# Patient Record
Sex: Male | Born: 1955 | Race: Black or African American | Hispanic: No | Smoking: Current every day smoker
Health system: Southern US, Community
[De-identification: ages and names within clinical notes are randomized; demographics above are authoritative.]

## PROBLEM LIST (undated history)

## (undated) DIAGNOSIS — Z91199 Patient's noncompliance with other medical treatment and regimen due to unspecified reason: Secondary | ICD-10-CM

## (undated) DIAGNOSIS — R52 Pain, unspecified: Secondary | ICD-10-CM

## (undated) DIAGNOSIS — Z9119 Patient's noncompliance with other medical treatment and regimen: Secondary | ICD-10-CM

## (undated) DIAGNOSIS — I10 Essential (primary) hypertension: Secondary | ICD-10-CM

## (undated) DIAGNOSIS — G8929 Other chronic pain: Secondary | ICD-10-CM

## (undated) DIAGNOSIS — E119 Type 2 diabetes mellitus without complications: Secondary | ICD-10-CM

## (undated) DIAGNOSIS — N289 Disorder of kidney and ureter, unspecified: Secondary | ICD-10-CM

## (undated) DIAGNOSIS — F1910 Other psychoactive substance abuse, uncomplicated: Secondary | ICD-10-CM

## (undated) HISTORY — PX: FEMUR FRACTURE SURGERY: SHX633

## (undated) HISTORY — PX: REPLACEMENT TOTAL KNEE: SUR1224

---

## 1989-06-15 HISTORY — PX: KNEE SURGERY: SHX244

## 2004-08-14 ENCOUNTER — Ambulatory Visit: Payer: Self-pay | Admitting: Cardiovascular Disease

## 2004-10-09 ENCOUNTER — Emergency Department: Payer: Self-pay | Admitting: Internal Medicine

## 2007-12-31 ENCOUNTER — Emergency Department: Payer: Self-pay | Admitting: Unknown Physician Specialty

## 2008-03-01 ENCOUNTER — Emergency Department: Payer: Self-pay | Admitting: Emergency Medicine

## 2009-11-12 ENCOUNTER — Ambulatory Visit: Payer: Self-pay | Admitting: Pain Medicine

## 2009-11-18 ENCOUNTER — Ambulatory Visit: Payer: Self-pay | Admitting: Pain Medicine

## 2009-11-20 ENCOUNTER — Ambulatory Visit: Payer: Self-pay | Admitting: Pain Medicine

## 2013-03-05 ENCOUNTER — Inpatient Hospital Stay: Payer: Self-pay | Admitting: Student

## 2013-03-05 LAB — CBC
HCT: 50.8 % (ref 40.0–52.0)
HGB: 16.9 g/dL (ref 13.0–18.0)
MCH: 27.9 pg (ref 26.0–34.0)
MCHC: 33.2 g/dL (ref 32.0–36.0)
MCV: 84 fL (ref 80–100)
RBC: 6.04 10*6/uL — ABNORMAL HIGH (ref 4.40–5.90)

## 2013-03-05 LAB — DRUG SCREEN, URINE
Amphetamines, Ur Screen: NEGATIVE (ref ?–1000)
Barbiturates, Ur Screen: NEGATIVE (ref ?–200)
Benzodiazepine, Ur Scrn: POSITIVE (ref ?–200)
Cannabinoid 50 Ng, Ur ~~LOC~~: POSITIVE (ref ?–50)
MDMA (Ecstasy)Ur Screen: NEGATIVE (ref ?–500)
Phencyclidine (PCP) Ur S: NEGATIVE (ref ?–25)
Tricyclic, Ur Screen: NEGATIVE (ref ?–1000)

## 2013-03-05 LAB — COMPREHENSIVE METABOLIC PANEL
Albumin: 4.9 g/dL (ref 3.4–5.0)
Alkaline Phosphatase: 128 U/L (ref 50–136)
BUN: 17 mg/dL (ref 7–18)
Calcium, Total: 9 mg/dL (ref 8.5–10.1)
Chloride: 96 mmol/L — ABNORMAL LOW (ref 98–107)
Co2: 29 mmol/L (ref 21–32)
EGFR (African American): 44 — ABNORMAL LOW
SGOT(AST): 598 U/L — ABNORMAL HIGH (ref 15–37)
SGPT (ALT): 167 U/L — ABNORMAL HIGH (ref 12–78)
Sodium: 132 mmol/L — ABNORMAL LOW (ref 136–145)

## 2013-03-05 LAB — CK TOTAL AND CKMB (NOT AT ARMC)
CK, Total: 14990 U/L — ABNORMAL HIGH (ref 35–232)
CK, Total: 40165 U/L — ABNORMAL HIGH (ref 35–232)
CK-MB: 221 ng/mL — ABNORMAL HIGH (ref 0.5–3.6)

## 2013-03-05 LAB — URINALYSIS, COMPLETE
Bilirubin,UR: NEGATIVE
Glucose,UR: 50 mg/dL (ref 0–75)
Ph: 5 (ref 4.5–8.0)
Protein: 100
RBC,UR: <1 /HPF (ref 0–5)
Specific Gravity: 1.025 (ref 1.003–1.030)
Squamous Epithelial: NONE SEEN
WBC UR: <1 /HPF (ref 0–5)

## 2013-03-05 LAB — ETHANOL: Ethanol %: <0.003 % (ref 0.000–0.080)

## 2013-03-05 LAB — ACETAMINOPHEN LEVEL: Acetaminophen: <2 ug/mL

## 2013-03-05 LAB — TSH: Thyroid Stimulating Horm: 1.07 u[IU]/mL

## 2013-03-05 LAB — SALICYLATE LEVEL: Salicylates, Serum: 3.3 mg/dL — ABNORMAL HIGH

## 2013-03-05 LAB — TROPONIN I: Troponin-I: <0.02 ng/mL

## 2013-03-06 LAB — CK TOTAL AND CKMB (NOT AT ARMC): CK-MB: 482.1 ng/mL — ABNORMAL HIGH (ref 0.5–3.6)

## 2013-03-06 LAB — URIC ACID: Uric Acid: 8.1 mg/dL — ABNORMAL HIGH (ref 3.5–7.2)

## 2013-03-06 LAB — HEPATIC FUNCTION PANEL A (ARMC)
Albumin: 2.5 g/dL — ABNORMAL LOW (ref 3.4–5.0)
Alkaline Phosphatase: 95 U/L (ref 50–136)
Bilirubin, Direct: 0.3 mg/dL — ABNORMAL HIGH (ref 0.00–0.20)
Bilirubin,Total: 0.7 mg/dL (ref 0.2–1.0)
SGOT(AST): 1976 U/L — ABNORMAL HIGH (ref 15–37)
SGPT (ALT): 362 U/L — ABNORMAL HIGH (ref 12–78)
Total Protein: 6.3 g/dL — ABNORMAL LOW (ref 6.4–8.2)

## 2013-03-06 LAB — CBC WITH DIFFERENTIAL/PLATELET
Basophil #: 0.1 10*3/uL (ref 0.0–0.1)
Basophil %: 0.9 %
Eosinophil #: 0 10*3/uL (ref 0.0–0.7)
HCT: 49.7 % (ref 40.0–52.0)
HGB: 16.4 g/dL (ref 13.0–18.0)
Lymphocyte #: 1.2 10*3/uL (ref 1.0–3.6)
Lymphocyte %: 10.8 %
MCH: 27.7 pg (ref 26.0–34.0)
MCHC: 33 g/dL (ref 32.0–36.0)
Monocyte #: 1.6 x10 3/mm — ABNORMAL HIGH (ref 0.2–1.0)
Neutrophil #: 8.5 10*3/uL — ABNORMAL HIGH (ref 1.4–6.5)
Neutrophil %: 74.5 %
WBC: 11.5 10*3/uL — ABNORMAL HIGH (ref 3.8–10.6)

## 2013-03-06 LAB — LIPID PANEL
HDL Cholesterol: 49 mg/dL (ref 40–60)
Ldl Cholesterol, Calc: 126 mg/dL — ABNORMAL HIGH (ref 0–100)

## 2013-03-06 LAB — BASIC METABOLIC PANEL
Anion Gap: 8 (ref 7–16)
Co2: 24 mmol/L (ref 21–32)
Creatinine: 3.77 mg/dL — ABNORMAL HIGH (ref 0.60–1.30)
EGFR (African American): 20 — ABNORMAL LOW
EGFR (Non-African Amer.): 17 — ABNORMAL LOW
Potassium: 5.1 mmol/L (ref 3.5–5.1)

## 2013-03-06 LAB — RAPID HIV-1/2 QL/CONFIRM: HIV-1/2,Rapid Ql: NEGATIVE

## 2013-03-06 LAB — PHOSPHORUS: Phosphorus: 6.8 mg/dL — ABNORMAL HIGH (ref 2.5–4.9)

## 2013-03-07 LAB — DRUG SCREEN, URINE
Amphetamines, Ur Screen: NEGATIVE (ref ?–1000)
Benzodiazepine, Ur Scrn: POSITIVE (ref ?–200)
Cannabinoid 50 Ng, Ur ~~LOC~~: NEGATIVE (ref ?–50)
Cocaine Metabolite,Ur ~~LOC~~: POSITIVE (ref ?–300)
MDMA (Ecstasy)Ur Screen: NEGATIVE (ref ?–500)
Methadone, Ur Screen: POSITIVE (ref ?–300)
Opiate, Ur Screen: POSITIVE (ref ?–300)
Phencyclidine (PCP) Ur S: NEGATIVE (ref ?–25)
Tricyclic, Ur Screen: NEGATIVE (ref ?–1000)

## 2013-03-07 LAB — PROTIME-INR
INR: 1
Prothrombin Time: 13.8 secs (ref 11.5–14.7)

## 2013-03-07 LAB — COMPREHENSIVE METABOLIC PANEL
Albumin: 2.4 g/dL — ABNORMAL LOW (ref 3.4–5.0)
Alkaline Phosphatase: 79 U/L (ref 50–136)
Bilirubin,Total: 0.5 mg/dL (ref 0.2–1.0)
Calcium, Total: 7.6 mg/dL — ABNORMAL LOW (ref 8.5–10.1)
Co2: 25 mmol/L (ref 21–32)
EGFR (African American): 13 — ABNORMAL LOW
EGFR (Non-African Amer.): 11 — ABNORMAL LOW
Glucose: 162 mg/dL — ABNORMAL HIGH (ref 65–99)
Osmolality: 283 (ref 275–301)
Potassium: 4.4 mmol/L (ref 3.5–5.1)
SGOT(AST): 1638 U/L — ABNORMAL HIGH (ref 15–37)
Sodium: 135 mmol/L — ABNORMAL LOW (ref 136–145)

## 2013-03-07 LAB — CBC WITH DIFFERENTIAL/PLATELET
Basophil #: 0.1 10*3/uL (ref 0.0–0.1)
Eosinophil %: 0 %
HGB: 14.2 g/dL (ref 13.0–18.0)
Lymphocyte %: 9.8 %
MCHC: 33.6 g/dL (ref 32.0–36.0)
MCV: 83 fL (ref 80–100)
Neutrophil %: 75 %
Platelet: 90 10*3/uL — ABNORMAL LOW (ref 150–440)
RBC: 5.09 10*6/uL (ref 4.40–5.90)
RDW: 13.7 % (ref 11.5–14.5)

## 2013-03-07 LAB — KAPPA/LAMBDA FREE LIGHT CHAINS (ARMC)

## 2013-03-07 LAB — CK: CK, Total: 39636 U/L — ABNORMAL HIGH (ref 35–232)

## 2013-03-07 LAB — PHOSPHORUS: Phosphorus: 6.8 mg/dL — ABNORMAL HIGH (ref 2.5–4.9)

## 2013-03-08 LAB — CBC WITH DIFFERENTIAL/PLATELET
Basophil #: 0 10*3/uL (ref 0.0–0.1)
Basophil %: 0.2 %
Eosinophil %: 0.2 %
HGB: 11.4 g/dL — ABNORMAL LOW (ref 13.0–18.0)
Lymphocyte #: 1.3 10*3/uL (ref 1.0–3.6)
Lymphocyte %: 17.7 %
MCH: 28.1 pg (ref 26.0–34.0)
Monocyte #: 1.1 x10 3/mm — ABNORMAL HIGH (ref 0.2–1.0)
Monocyte %: 14.8 %
Neutrophil #: 4.9 10*3/uL (ref 1.4–6.5)
Neutrophil %: 67.1 %
Platelet: 75 10*3/uL — ABNORMAL LOW (ref 150–440)
RBC: 4.05 10*6/uL — ABNORMAL LOW (ref 4.40–5.90)
WBC: 7.2 10*3/uL (ref 3.8–10.6)

## 2013-03-08 LAB — COMPREHENSIVE METABOLIC PANEL
Anion Gap: 7 (ref 7–16)
BUN: 46 mg/dL — ABNORMAL HIGH (ref 7–18)
Bilirubin,Total: 0.6 mg/dL (ref 0.2–1.0)
Calcium, Total: 7.5 mg/dL — ABNORMAL LOW (ref 8.5–10.1)
Chloride: 101 mmol/L (ref 98–107)
EGFR (African American): 11 — ABNORMAL LOW
Glucose: 154 mg/dL — ABNORMAL HIGH (ref 65–99)
Osmolality: 283 (ref 275–301)
SGOT(AST): 1176 U/L — ABNORMAL HIGH (ref 15–37)
Sodium: 134 mmol/L — ABNORMAL LOW (ref 136–145)
Total Protein: 5.5 g/dL — ABNORMAL LOW (ref 6.4–8.2)

## 2013-03-08 LAB — CK TOTAL AND CKMB (NOT AT ARMC)
CK, Total: 31335 U/L — ABNORMAL HIGH (ref 35–232)
CK-MB: 66.5 ng/mL — ABNORMAL HIGH (ref 0.5–3.6)

## 2013-03-08 LAB — PHOSPHORUS: Phosphorus: 6 mg/dL — ABNORMAL HIGH (ref 2.5–4.9)

## 2013-03-08 LAB — CK: CK, Total: 25213 U/L — ABNORMAL HIGH (ref 35–232)

## 2013-03-09 LAB — CBC WITH DIFFERENTIAL/PLATELET
Basophil %: 0.7 %
Eosinophil #: 0 10*3/uL (ref 0.0–0.7)
Eosinophil %: 0.4 %
HGB: 11.1 g/dL — ABNORMAL LOW (ref 13.0–18.0)
Lymphocyte #: 1.1 10*3/uL (ref 1.0–3.6)
Lymphocyte %: 16.2 %
MCH: 28.5 pg (ref 26.0–34.0)
MCHC: 34.8 g/dL (ref 32.0–36.0)
Monocyte #: 1 x10 3/mm (ref 0.2–1.0)
Neutrophil #: 4.4 10*3/uL (ref 1.4–6.5)
Neutrophil %: 67.8 %
Platelet: 78 10*3/uL — ABNORMAL LOW (ref 150–440)
RDW: 13.5 % (ref 11.5–14.5)

## 2013-03-09 LAB — COMPREHENSIVE METABOLIC PANEL
Albumin: 2.2 g/dL — ABNORMAL LOW (ref 3.4–5.0)
Anion Gap: 8 (ref 7–16)
BUN: 49 mg/dL — ABNORMAL HIGH (ref 7–18)
Bilirubin,Total: 0.7 mg/dL (ref 0.2–1.0)
Calcium, Total: 7.7 mg/dL — ABNORMAL LOW (ref 8.5–10.1)
Chloride: 100 mmol/L (ref 98–107)
Co2: 24 mmol/L (ref 21–32)
EGFR (African American): 10 — ABNORMAL LOW
Potassium: 4.2 mmol/L (ref 3.5–5.1)
SGPT (ALT): 298 U/L — ABNORMAL HIGH (ref 12–78)
Total Protein: 5.5 g/dL — ABNORMAL LOW (ref 6.4–8.2)

## 2013-03-09 LAB — CK: CK, Total: 20348 U/L — ABNORMAL HIGH (ref 35–232)

## 2013-03-09 LAB — URIC ACID: Uric Acid: 6.6 mg/dL (ref 3.5–7.2)

## 2013-03-10 LAB — COMPREHENSIVE METABOLIC PANEL
Albumin: 2.3 g/dL — ABNORMAL LOW (ref 3.4–5.0)
Anion Gap: 9 (ref 7–16)
BUN: 45 mg/dL — ABNORMAL HIGH (ref 7–18)
Creatinine: 5.9 mg/dL — ABNORMAL HIGH (ref 0.60–1.30)
EGFR (Non-African Amer.): 10 — ABNORMAL LOW
Glucose: 158 mg/dL — ABNORMAL HIGH (ref 65–99)
Osmolality: 279 (ref 275–301)
Potassium: 4.3 mmol/L (ref 3.5–5.1)
SGOT(AST): 744 U/L — ABNORMAL HIGH (ref 15–37)
Sodium: 132 mmol/L — ABNORMAL LOW (ref 136–145)

## 2013-03-10 LAB — CBC WITH DIFFERENTIAL/PLATELET
Basophil %: 0.5 %
Eosinophil #: 0.1 10*3/uL (ref 0.0–0.7)
Eosinophil %: 1.3 %
HCT: 33.1 % — ABNORMAL LOW (ref 40.0–52.0)
HGB: 11.3 g/dL — ABNORMAL LOW (ref 13.0–18.0)
Lymphocyte #: 0.9 10*3/uL — ABNORMAL LOW (ref 1.0–3.6)
Monocyte #: 1.2 x10 3/mm — ABNORMAL HIGH (ref 0.2–1.0)
Monocyte %: 15.4 %
Neutrophil %: 70.6 %
Platelet: 91 10*3/uL — ABNORMAL LOW (ref 150–440)
RDW: 13 % (ref 11.5–14.5)
WBC: 7.7 10*3/uL (ref 3.8–10.6)

## 2013-03-10 LAB — HEPATIC FUNCTION PANEL A (ARMC)
Albumin: 2.2 g/dL — ABNORMAL LOW (ref 3.4–5.0)
Bilirubin,Total: 0.9 mg/dL (ref 0.2–1.0)
Total Protein: 5.7 g/dL — ABNORMAL LOW (ref 6.4–8.2)

## 2013-03-10 LAB — PROTIME-INR: Prothrombin Time: 12.7 secs (ref 11.5–14.7)

## 2013-03-10 LAB — PHOSPHORUS: Phosphorus: 5.5 mg/dL — ABNORMAL HIGH (ref 2.5–4.9)

## 2013-03-11 LAB — CBC WITH DIFFERENTIAL/PLATELET
Basophil #: 0.1 10*3/uL (ref 0.0–0.1)
Basophil %: 0.7 %
Eosinophil #: 0.2 10*3/uL (ref 0.0–0.7)
Eosinophil %: 2.1 %
HCT: 33 % — ABNORMAL LOW (ref 40.0–52.0)
HGB: 11.3 g/dL — ABNORMAL LOW (ref 13.0–18.0)
Lymphocyte #: 1.2 10*3/uL (ref 1.0–3.6)
Lymphocyte %: 13.5 %
MCH: 28.1 pg (ref 26.0–34.0)
Monocyte #: 1.3 x10 3/mm — ABNORMAL HIGH (ref 0.2–1.0)
Monocyte %: 14.5 %
Neutrophil #: 6.3 10*3/uL (ref 1.4–6.5)
Platelet: 98 10*3/uL — ABNORMAL LOW (ref 150–440)
RDW: 13.3 % (ref 11.5–14.5)
WBC: 9.1 10*3/uL (ref 3.8–10.6)

## 2013-03-11 LAB — COMPREHENSIVE METABOLIC PANEL
Anion Gap: 9 (ref 7–16)
Co2: 26 mmol/L (ref 21–32)
Creatinine: 7.92 mg/dL — ABNORMAL HIGH (ref 0.60–1.30)
EGFR (African American): 8 — ABNORMAL LOW
EGFR (Non-African Amer.): 7 — ABNORMAL LOW
Sodium: 128 mmol/L — ABNORMAL LOW (ref 136–145)

## 2013-03-11 LAB — HEPATIC FUNCTION PANEL A (ARMC)
Alkaline Phosphatase: 52 U/L (ref 50–136)
Bilirubin,Total: 1 mg/dL (ref 0.2–1.0)
SGOT(AST): 494 U/L — ABNORMAL HIGH (ref 15–37)
SGPT (ALT): 234 U/L — ABNORMAL HIGH (ref 12–78)
Total Protein: 5.4 g/dL — ABNORMAL LOW (ref 6.4–8.2)

## 2013-03-11 LAB — PROTIME-INR: Prothrombin Time: 12.5 secs (ref 11.5–14.7)

## 2013-03-11 LAB — PHOSPHORUS: Phosphorus: 7.2 mg/dL — ABNORMAL HIGH (ref 2.5–4.9)

## 2013-03-12 LAB — CBC WITH DIFFERENTIAL/PLATELET
Basophil #: 0 10*3/uL (ref 0.0–0.1)
Basophil %: 0.4 %
Eosinophil %: 2.3 %
HCT: 31.2 % — ABNORMAL LOW (ref 40.0–52.0)
Lymphocyte #: 1.2 10*3/uL (ref 1.0–3.6)
Lymphocyte %: 13.2 %
MCH: 28.2 pg (ref 26.0–34.0)
MCV: 81 fL (ref 80–100)
Monocyte #: 1.7 x10 3/mm — ABNORMAL HIGH (ref 0.2–1.0)
Neutrophil #: 6.2 10*3/uL (ref 1.4–6.5)
Neutrophil %: 66.2 %
Platelet: 108 10*3/uL — ABNORMAL LOW (ref 150–440)
WBC: 9.3 10*3/uL (ref 3.8–10.6)

## 2013-03-12 LAB — BASIC METABOLIC PANEL
BUN: 64 mg/dL — ABNORMAL HIGH (ref 7–18)
Calcium, Total: 8.1 mg/dL — ABNORMAL LOW (ref 8.5–10.1)
Chloride: 96 mmol/L — ABNORMAL LOW (ref 98–107)
Co2: 27 mmol/L (ref 21–32)
Creatinine: 7.35 mg/dL — ABNORMAL HIGH (ref 0.60–1.30)
EGFR (African American): 9 — ABNORMAL LOW
EGFR (Non-African Amer.): 8 — ABNORMAL LOW
Glucose: 142 mg/dL — ABNORMAL HIGH (ref 65–99)
Osmolality: 283 (ref 275–301)

## 2013-03-13 LAB — HEPATIC FUNCTION PANEL A (ARMC)
Albumin: 2.1 g/dL — ABNORMAL LOW (ref 3.4–5.0)
Bilirubin, Direct: 0.2 mg/dL (ref 0.00–0.20)
Bilirubin,Total: 0.7 mg/dL (ref 0.2–1.0)
SGOT(AST): 221 U/L — ABNORMAL HIGH (ref 15–37)
SGPT (ALT): 152 U/L — ABNORMAL HIGH (ref 12–78)
Total Protein: 5 g/dL — ABNORMAL LOW (ref 6.4–8.2)

## 2013-03-13 LAB — BASIC METABOLIC PANEL
BUN: 78 mg/dL — ABNORMAL HIGH (ref 7–18)
Calcium, Total: 8 mg/dL — ABNORMAL LOW (ref 8.5–10.1)
Co2: 24 mmol/L (ref 21–32)
Potassium: 4.4 mmol/L (ref 3.5–5.1)
Sodium: 128 mmol/L — ABNORMAL LOW (ref 136–145)

## 2013-03-13 LAB — CK: CK, Total: 6868 U/L — ABNORMAL HIGH (ref 35–232)

## 2013-03-13 LAB — CBC WITH DIFFERENTIAL/PLATELET
Eosinophil #: 0.2 10*3/uL (ref 0.0–0.7)
Eosinophil %: 2.3 %
HCT: 29.6 % — ABNORMAL LOW (ref 40.0–52.0)
HGB: 10.3 g/dL — ABNORMAL LOW (ref 13.0–18.0)
Lymphocyte #: 1.6 10*3/uL (ref 1.0–3.6)
MCV: 81 fL (ref 80–100)
Monocyte %: 16 %
Platelet: 110 10*3/uL — ABNORMAL LOW (ref 150–440)
RBC: 3.64 10*6/uL — ABNORMAL LOW (ref 4.40–5.90)
WBC: 9.6 10*3/uL (ref 3.8–10.6)

## 2013-03-13 LAB — PHOSPHORUS: Phosphorus: 7 mg/dL — ABNORMAL HIGH (ref 2.5–4.9)

## 2013-03-14 LAB — CBC WITH DIFFERENTIAL/PLATELET
Basophil #: 0 10*3/uL (ref 0.0–0.1)
Basophil %: 0.3 %
Eosinophil %: 1.8 %
Lymphocyte #: 1.2 10*3/uL (ref 1.0–3.6)
Lymphocyte %: 13.3 %
MCH: 28 pg (ref 26.0–34.0)
MCV: 82 fL (ref 80–100)
Monocyte #: 1.6 x10 3/mm — ABNORMAL HIGH (ref 0.2–1.0)
Monocyte %: 17.5 %
Neutrophil #: 6.2 10*3/uL (ref 1.4–6.5)
Platelet: 116 10*3/uL — ABNORMAL LOW (ref 150–440)

## 2013-03-14 LAB — PROTIME-INR
INR: 1
Prothrombin Time: 13.4 secs (ref 11.5–14.7)

## 2013-03-14 LAB — BASIC METABOLIC PANEL
Anion Gap: 7 (ref 7–16)
BUN: 65 mg/dL — ABNORMAL HIGH (ref 7–18)
Chloride: 96 mmol/L — ABNORMAL LOW (ref 98–107)
Co2: 29 mmol/L (ref 21–32)
EGFR (African American): 8 — ABNORMAL LOW
Glucose: 165 mg/dL — ABNORMAL HIGH (ref 65–99)
Potassium: 4.3 mmol/L (ref 3.5–5.1)
Sodium: 132 mmol/L — ABNORMAL LOW (ref 136–145)

## 2013-03-14 LAB — HEPATIC FUNCTION PANEL A (ARMC)
Albumin: 1.9 g/dL — ABNORMAL LOW (ref 3.4–5.0)
SGOT(AST): 156 U/L — ABNORMAL HIGH (ref 15–37)
Total Protein: 4.7 g/dL — ABNORMAL LOW (ref 6.4–8.2)

## 2013-03-15 LAB — BASIC METABOLIC PANEL
Anion Gap: 7 (ref 7–16)
BUN: 47 mg/dL — ABNORMAL HIGH (ref 7–18)
Calcium, Total: 7.9 mg/dL — ABNORMAL LOW (ref 8.5–10.1)
Chloride: 96 mmol/L — ABNORMAL LOW (ref 98–107)
Co2: 29 mmol/L (ref 21–32)
Creatinine: 6.47 mg/dL — ABNORMAL HIGH (ref 0.60–1.30)
EGFR (African American): 10 — ABNORMAL LOW
EGFR (Non-African Amer.): 9 — ABNORMAL LOW
Glucose: 137 mg/dL — ABNORMAL HIGH (ref 65–99)
Osmolality: 279 (ref 275–301)
Potassium: 4.2 mmol/L (ref 3.5–5.1)
Sodium: 132 mmol/L — ABNORMAL LOW (ref 136–145)

## 2013-03-15 LAB — CBC WITH DIFFERENTIAL/PLATELET
Basophil #: 0.1 10*3/uL (ref 0.0–0.1)
Basophil %: 0.8 %
Eosinophil #: 0.1 10*3/uL (ref 0.0–0.7)
Eosinophil %: 1.3 %
HCT: 27 % — ABNORMAL LOW (ref 40.0–52.0)
HGB: 9.2 g/dL — ABNORMAL LOW (ref 13.0–18.0)
Lymphocyte #: 1.5 10*3/uL (ref 1.0–3.6)
Lymphocyte %: 15.4 %
MCH: 28 pg (ref 26.0–34.0)
MCHC: 34 g/dL (ref 32.0–36.0)
MCV: 82 fL (ref 80–100)
Monocyte #: 1.5 x10 3/mm — ABNORMAL HIGH (ref 0.2–1.0)
Monocyte %: 15.4 %
Neutrophil #: 6.5 10*3/uL (ref 1.4–6.5)
Neutrophil %: 67.1 %
Platelet: 121 10*3/uL — ABNORMAL LOW (ref 150–440)
RBC: 3.28 10*6/uL — ABNORMAL LOW (ref 4.40–5.90)
RDW: 13.3 % (ref 11.5–14.5)
WBC: 9.6 10*3/uL (ref 3.8–10.6)

## 2013-03-15 LAB — PROTIME-INR: INR: 1

## 2013-03-15 LAB — HEPATIC FUNCTION PANEL A (ARMC)
SGOT(AST): 124 U/L — ABNORMAL HIGH (ref 15–37)
SGPT (ALT): 106 U/L — ABNORMAL HIGH (ref 12–78)
Total Protein: 4.8 g/dL — ABNORMAL LOW (ref 6.4–8.2)

## 2013-03-16 ENCOUNTER — Inpatient Hospital Stay
Admission: AD | Admit: 2013-03-16 | Discharge: 2013-04-05 | Disposition: A | Discharge status: Home Health | Payer: No Typology Code available for payment source | Source: Ambulatory Visit | Attending: Internal Medicine | Admitting: Internal Medicine

## 2013-03-16 LAB — CBC WITH DIFFERENTIAL/PLATELET
Basophil #: 0 10*3/uL (ref 0.0–0.1)
Basophil %: 0.4 %
HCT: 25.3 % — ABNORMAL LOW (ref 40.0–52.0)
HGB: 8.5 g/dL — ABNORMAL LOW (ref 13.0–18.0)
MCV: 83 fL (ref 80–100)
Monocyte #: 1.3 x10 3/mm — ABNORMAL HIGH (ref 0.2–1.0)
Neutrophil #: 6.2 10*3/uL (ref 1.4–6.5)
RBC: 3.05 10*6/uL — ABNORMAL LOW (ref 4.40–5.90)
RDW: 13.6 % (ref 11.5–14.5)

## 2013-03-16 LAB — COMPREHENSIVE METABOLIC PANEL
Alkaline Phosphatase: 45 U/L — ABNORMAL LOW (ref 50–136)
Anion Gap: 6 — ABNORMAL LOW (ref 7–16)
BUN: 36 mg/dL — ABNORMAL HIGH (ref 7–18)
Bilirubin,Total: 0.7 mg/dL (ref 0.2–1.0)
EGFR (Non-African Amer.): 10 — ABNORMAL LOW
Glucose: 146 mg/dL — ABNORMAL HIGH (ref 65–99)
SGOT(AST): 112 U/L — ABNORMAL HIGH (ref 15–37)
SGPT (ALT): 92 U/L — ABNORMAL HIGH (ref 12–78)
Sodium: 134 mmol/L — ABNORMAL LOW (ref 136–145)

## 2013-03-16 LAB — PHOSPHORUS
Phosphorus: 3.8 mg/dL (ref 2.5–4.9)
Phosphorus: 3.7 mg/dL (ref 2.5–4.9)

## 2013-03-17 ENCOUNTER — Other Ambulatory Visit (HOSPITAL_COMMUNITY): Payer: No Typology Code available for payment source

## 2013-03-17 LAB — PRO B NATRIURETIC PEPTIDE: Pro B Natriuretic peptide (BNP): 5120 pg/mL — ABNORMAL HIGH (ref 0–125)

## 2013-03-17 LAB — PHOSPHORUS: Phosphorus: 3.9 mg/dL (ref 2.3–4.6)

## 2013-03-17 LAB — MAGNESIUM: Magnesium: 1.8 mg/dL (ref 1.5–2.5)

## 2013-03-17 LAB — PREALBUMIN: Prealbumin: 13.7 mg/dL — ABNORMAL LOW (ref 17.0–34.0)

## 2013-03-18 ENCOUNTER — Other Ambulatory Visit (HOSPITAL_COMMUNITY): Payer: Self-pay

## 2013-03-18 ENCOUNTER — Other Ambulatory Visit (HOSPITAL_COMMUNITY): Payer: No Typology Code available for payment source

## 2013-03-18 LAB — HEMOGLOBIN A1C
Hgb A1c MFr Bld: 8.6 % — ABNORMAL HIGH
Mean Plasma Glucose: 200 mg/dL — ABNORMAL HIGH

## 2013-03-18 LAB — CBC WITH DIFFERENTIAL/PLATELET
Basophils Absolute: 0 K/uL (ref 0.0–0.1)
Basophils Relative: 0 % (ref 0–1)
Eosinophils Absolute: 0.1 K/uL (ref 0.0–0.7)
Eosinophils Relative: 1 % (ref 0–5)
HCT: 24.2 % — ABNORMAL LOW (ref 39.0–52.0)
Hemoglobin: 8 g/dL — ABNORMAL LOW (ref 13.0–17.0)
Lymphocytes Relative: 16 % (ref 12–46)
Lymphs Abs: 1.6 K/uL (ref 0.7–4.0)
MCH: 27.6 pg (ref 26.0–34.0)
MCHC: 33.1 g/dL (ref 30.0–36.0)
MCV: 83.4 fL (ref 78.0–100.0)
Monocytes Absolute: 1.4 K/uL — ABNORMAL HIGH (ref 0.1–1.0)
Monocytes Relative: 15 % — ABNORMAL HIGH (ref 3–12)
Neutro Abs: 6.7 K/uL (ref 1.7–7.7)
Neutrophils Relative %: 68 % (ref 43–77)
Platelets: 136 K/uL — ABNORMAL LOW (ref 150–400)
RBC: 2.9 MIL/uL — ABNORMAL LOW (ref 4.22–5.81)
RDW: 13.9 % (ref 11.5–15.5)
WBC: 9.8 K/uL (ref 4.0–10.5)

## 2013-03-18 LAB — URINALYSIS, ROUTINE W REFLEX MICROSCOPIC
Glucose, UA: NEGATIVE mg/dL
Ketones, ur: NEGATIVE mg/dL
Nitrite: NEGATIVE
Protein, ur: >300 mg/dL — AB

## 2013-03-18 LAB — BASIC METABOLIC PANEL WITH GFR
BUN: 32 mg/dL — ABNORMAL HIGH (ref 6–23)
CO2: 30 meq/L (ref 19–32)
Calcium: 7.9 mg/dL — ABNORMAL LOW (ref 8.4–10.5)
Chloride: 95 meq/L — ABNORMAL LOW (ref 96–112)
Creatinine, Ser: 5.37 mg/dL — ABNORMAL HIGH (ref 0.50–1.35)
GFR calc Af Amer: 10 mL/min — ABNORMAL LOW
GFR calc non Af Amer: 8 mL/min — ABNORMAL LOW
Glucose, Bld: 113 mg/dL — ABNORMAL HIGH (ref 70–99)
Potassium: 4.3 meq/L (ref 3.5–5.1)
Sodium: 133 meq/L — ABNORMAL LOW (ref 135–145)

## 2013-03-18 LAB — HEPATIC FUNCTION PANEL
AST: 50 U/L — ABNORMAL HIGH (ref 0–37)
Albumin: 2.2 g/dL — ABNORMAL LOW (ref 3.5–5.2)
Total Bilirubin: 0.7 mg/dL (ref 0.3–1.2)
Total Protein: 5.1 g/dL — ABNORMAL LOW (ref 6.0–8.3)

## 2013-03-18 LAB — ALBUMIN: Albumin: 2.1 g/dL — ABNORMAL LOW (ref 3.5–5.2)

## 2013-03-18 LAB — URINE MICROSCOPIC-ADD ON

## 2013-03-18 LAB — TSH: TSH: 2.823 u[IU]/mL (ref 0.350–4.500)

## 2013-03-19 ENCOUNTER — Other Ambulatory Visit (HOSPITAL_COMMUNITY): Payer: No Typology Code available for payment source

## 2013-03-19 LAB — HEPATIC FUNCTION PANEL
AST: 38 U/L — ABNORMAL HIGH (ref 0–37)
Albumin: 2.2 g/dL — ABNORMAL LOW (ref 3.5–5.2)
Total Bilirubin: 0.8 mg/dL (ref 0.3–1.2)
Total Protein: 5.4 g/dL — ABNORMAL LOW (ref 6.0–8.3)

## 2013-03-19 LAB — CBC WITH DIFFERENTIAL/PLATELET
Basophils Absolute: 0 10*3/uL (ref 0.0–0.1)
Basophils Relative: 0 % (ref 0–1)
Eosinophils Relative: 1 % (ref 0–5)
Hemoglobin: 7.8 g/dL — ABNORMAL LOW (ref 13.0–17.0)
Lymphs Abs: 1.6 10*3/uL (ref 0.7–4.0)
MCHC: 33.9 g/dL (ref 30.0–36.0)
MCV: 82.4 fL (ref 78.0–100.0)
Monocytes Relative: 10 % (ref 3–12)
Neutro Abs: 8.2 10*3/uL — ABNORMAL HIGH (ref 1.7–7.7)
Neutrophils Relative %: 74 % (ref 43–77)
Platelets: 151 10*3/uL (ref 150–400)
RBC: 2.79 MIL/uL — ABNORMAL LOW (ref 4.22–5.81)

## 2013-03-19 LAB — BLOOD GAS, ARTERIAL
Bicarbonate: 27.3 mEq/L — ABNORMAL HIGH (ref 20.0–24.0)
O2 Content: 5 L/min
O2 Saturation: 92.2 %
pCO2 arterial: 41.3 mmHg (ref 35.0–45.0)
pH, Arterial: 7.435 (ref 7.350–7.450)
pO2, Arterial: 62.5 mmHg — ABNORMAL LOW (ref 80.0–100.0)

## 2013-03-19 LAB — URINE CULTURE

## 2013-03-19 LAB — HIV ANTIBODY (ROUTINE TESTING W REFLEX): HIV: NONREACTIVE

## 2013-03-19 LAB — BASIC METABOLIC PANEL
BUN: 56 mg/dL — ABNORMAL HIGH (ref 6–23)
GFR calc Af Amer: 9 mL/min — ABNORMAL LOW (ref >90)
GFR calc non Af Amer: 8 mL/min — ABNORMAL LOW (ref >90)
Glucose, Bld: 98 mg/dL (ref 70–99)
Potassium: 4.9 mEq/L (ref 3.5–5.1)
Sodium: 130 mEq/L — ABNORMAL LOW (ref 135–145)

## 2013-03-19 LAB — PREPARE RBC (CROSSMATCH)

## 2013-03-19 LAB — CK: Total CK: 896 U/L — ABNORMAL HIGH (ref 7–232)

## 2013-03-20 LAB — BASIC METABOLIC PANEL
BUN: 70 mg/dL — ABNORMAL HIGH (ref 6–23)
Chloride: 95 mEq/L — ABNORMAL LOW (ref 96–112)
Creatinine, Ser: 8.35 mg/dL — ABNORMAL HIGH (ref 0.50–1.35)
GFR calc Af Amer: 7 mL/min — ABNORMAL LOW (ref >90)
GFR calc non Af Amer: 6 mL/min — ABNORMAL LOW (ref >90)
Potassium: 5.2 mEq/L — ABNORMAL HIGH (ref 3.5–5.1)

## 2013-03-20 LAB — CBC WITH DIFFERENTIAL/PLATELET
Basophils Absolute: 0 10*3/uL (ref 0.0–0.1)
Basophils Relative: 0 % (ref 0–1)
Eosinophils Relative: 2 % (ref 0–5)
Hemoglobin: 7.4 g/dL — ABNORMAL LOW (ref 13.0–17.0)
Lymphocytes Relative: 15 % (ref 12–46)
Lymphs Abs: 1.4 10*3/uL (ref 0.7–4.0)
MCHC: 33.8 g/dL (ref 30.0–36.0)
Monocytes Absolute: 1 10*3/uL (ref 0.1–1.0)
Neutro Abs: 7.3 10*3/uL (ref 1.7–7.7)
Neutrophils Relative %: 74 % (ref 43–77)
RDW: 13.4 % (ref 11.5–15.5)
WBC: 9.9 10*3/uL (ref 4.0–10.5)

## 2013-03-20 LAB — VANCOMYCIN, RANDOM: Vancomycin Rm: 7.7 ug/mL

## 2013-03-21 LAB — CULTURE, BLOOD (SINGLE)

## 2013-03-21 LAB — BASIC METABOLIC PANEL
Chloride: 95 mEq/L — ABNORMAL LOW (ref 96–112)
GFR calc Af Amer: 9 mL/min — ABNORMAL LOW (ref >90)
GFR calc non Af Amer: 8 mL/min — ABNORMAL LOW (ref >90)
Potassium: 4.5 mEq/L (ref 3.5–5.1)
Sodium: 134 mEq/L — ABNORMAL LOW (ref 135–145)

## 2013-03-21 LAB — CBC
Hemoglobin: 7.2 g/dL — ABNORMAL LOW (ref 13.0–17.0)
MCHC: 32.9 g/dL (ref 30.0–36.0)
Platelets: 161 10*3/uL (ref 150–400)
RBC: 2.6 MIL/uL — ABNORMAL LOW (ref 4.22–5.81)
WBC: 7.9 10*3/uL (ref 4.0–10.5)

## 2013-03-21 LAB — MAGNESIUM: Magnesium: 2.1 mg/dL (ref 1.5–2.5)

## 2013-03-21 LAB — PHOSPHORUS: Phosphorus: 5.8 mg/dL — ABNORMAL HIGH (ref 2.3–4.6)

## 2013-03-21 LAB — HEPATITIS B E ANTIGEN: Hep B E Ag: NEGATIVE

## 2013-03-22 ENCOUNTER — Other Ambulatory Visit (HOSPITAL_COMMUNITY): Payer: No Typology Code available for payment source

## 2013-03-22 LAB — HEMOGLOBIN AND HEMATOCRIT, BLOOD
HCT: 20.6 % — ABNORMAL LOW (ref 39.0–52.0)
Hemoglobin: 7 g/dL — ABNORMAL LOW (ref 13.0–17.0)

## 2013-03-22 NOTE — Progress Notes (Signed)
Successful removal of right IJ Trialysis catheter. Proper Trendelenberg position and technique followed. Sterile prep. Occlusive dressing applied and pressure held X . No complications. Tip sent for culture.  Brayton El PA-C Interventional Radiology 03/22/2013 12:55 PM

## 2013-03-22 NOTE — H&P (Signed)
Joshua Gordon is an 57 y.o. male.   Chief Complaint: pt admitted to [facility] last week from [facility]. Presented with altered mental stats and fall at home ---- to [facility]. Dx: rhabdomyolysis; renal failure and need for dialysis. Transferred to [facility] for rehab and dialysis. Temporary trialysis catheter was placed in Rt IJ before dc from [facility]. Developed fever with +BC: coag neg staph 10/4 Was started on Vanco and Meropenem 10/4 Catheter is also noted to be not functioning properly- RN feels it is running slow. Last dialysis was Mon in [facility] Scheduled now for temporary trialysis catheter removal 10/8 then replacement 10/9 Discussed with dialysis RN on floor- plan per Dr Thedore Mins HPI: Multisubstance abuse; HTN; DM; renal failure  No past medical history on file.  No past surgical history on file.  No family history on file. Social History:  has no tobacco, alcohol, and drug history on file.  Allergies: Allergies not on file  No prescriptions prior to admission    Results for orders placed during the hospital encounter of 03/16/13 (from the past 48 hour(s))  VANCOMYCIN, RANDOM     Status: None   Collection Time    03/20/13 10:30 PM      Result Value Range   Vancomycin Rm 7.7     Comment:            Random Vancomycin therapeutic     range is dependent on dosage and     time of specimen collection.     A peak range is 20.0-40.0 ug/mL     A trough range is 5.0-15.0 ug/mL             BASIC METABOLIC PANEL     Status: Abnormal   Collection Time    03/21/13  6:15 AM      Result Value Range   Sodium 134 (*) 135 - 145 mEq/L   Potassium 4.5  3.5 - 5.1 mEq/L   Chloride 95 (*) 96 - 112 mEq/L   CO2 29  19 - 32 mEq/L   Glucose, Bld 110 (*) 70 - 99 mg/dL   BUN 57 (*) 6 - 23 mg/dL   Creatinine, Ser 5.40 (*) 0.50 - 1.35 mg/dL   Calcium 7.9 (*) 8.4 - 10.5 mg/dL   GFR calc non Af Amer 8 (*) >90 mL/min   GFR calc Af Amer 9 (*) >90 mL/min   Comment: (NOTE)     The  eGFR has been calculated using the CKD EPI equation.     This calculation has not been validated in all clinical situations.     eGFR's persistently <90 mL/min signify possible Chronic Kidney     Disease.  CBC     Status: Abnormal   Collection Time    03/21/13  6:15 AM      Result Value Range   WBC 7.9  4.0 - 10.5 K/uL   RBC 2.60 (*) 4.22 - 5.81 MIL/uL   Hemoglobin 7.2 (*) 13.0 - 17.0 g/dL   HCT 98.1 (*) 19.1 - 47.8 %   MCV 84.2  78.0 - 100.0 fL   MCH 27.7  26.0 - 34.0 pg   MCHC 32.9  30.0 - 36.0 g/dL   RDW 29.5  62.1 - 30.8 %   Platelets 161  150 - 400 K/uL  PHOSPHORUS     Status: Abnormal   Collection Time    03/21/13  6:15 AM      Result Value Range   Phosphorus 5.8 (*)  2.3 - 4.6 mg/dL  MAGNESIUM     Status: None   Collection Time    03/21/13  6:15 AM      Result Value Range   Magnesium 2.1  1.5 - 2.5 mg/dL  HEMOGLOBIN AND HEMATOCRIT, BLOOD     Status: Abnormal   Collection Time    03/22/13 10:35 AM      Result Value Range   Hemoglobin 7.0 (*) 13.0 - 17.0 g/dL   HCT 78.2 (*) 95.6 - 21.3 %   No results found.  Review of Systems  Constitutional: Negative for fever.  Respiratory: Negative for sputum production and shortness of breath.   Cardiovascular: Negative for chest pain.  Gastrointestinal: Negative for nausea, vomiting and abdominal pain.  Neurological: Positive for weakness. Negative for headaches.  Psychiatric/Behavioral: Positive for depression and substance abuse. The patient is nervous/anxious.        Polysubstance abuse    There were no vitals taken for this visit. Physical Exam  Constitutional: He is oriented to person, place, and time. He appears well-nourished.  Cardiovascular: Normal rate and regular rhythm.   No murmur heard. Respiratory: Effort normal and breath sounds normal. He has no wheezes.  GI: Soft. Bowel sounds are normal. There is no tenderness.  Musculoskeletal: Normal range of motion.  Neurological: He is alert and oriented to person,  place, and time. Coordination normal.  Skin: Skin is warm and dry.  Psychiatric: He has a normal mood and affect. His behavior is normal. Judgment and thought content normal.     Assessment/Plan Polysubstance abuse -- rhabdomyolysis -- renal failure- need for dialysis New +BC 10/4; on Vanco and meropenem Scheduled for trialysis catheter removal 10/8 then replacement of same 10/9 Plan per Renal MD Pt and wife aware of procedure benefits and risks and agreeable to proceed Consents signed and in chart  Fergus Throne A 03/22/2013, 11:22 AM

## 2013-03-23 ENCOUNTER — Other Ambulatory Visit (HOSPITAL_COMMUNITY): Payer: No Typology Code available for payment source

## 2013-03-23 LAB — CBC
HCT: 20.4 % — ABNORMAL LOW (ref 39.0–52.0)
Hemoglobin: 6.8 g/dL — CL (ref 13.0–17.0)
MCH: 27.6 pg (ref 26.0–34.0)
RBC: 2.46 MIL/uL — ABNORMAL LOW (ref 4.22–5.81)
WBC: 7.4 10*3/uL (ref 4.0–10.5)

## 2013-03-23 LAB — RENAL FUNCTION PANEL
BUN: 81 mg/dL — ABNORMAL HIGH (ref 6–23)
CO2: 27 mEq/L (ref 19–32)
Calcium: 8.4 mg/dL (ref 8.4–10.5)
GFR calc Af Amer: 7 mL/min — ABNORMAL LOW (ref >90)
Glucose, Bld: 98 mg/dL (ref 70–99)
Potassium: 4.3 mEq/L (ref 3.5–5.1)
Sodium: 135 mEq/L (ref 135–145)

## 2013-03-23 LAB — TYPE AND SCREEN

## 2013-03-23 LAB — PREPARE RBC (CROSSMATCH)

## 2013-03-23 NOTE — Progress Notes (Signed)
Echocardiogram 2D Echocardiogram has been performed.  Joshua Gordon 03/23/2013, 4:13 PM

## 2013-03-24 LAB — CULTURE, BLOOD (ROUTINE X 2)
Culture: NO GROWTH
Culture: NO GROWTH

## 2013-03-24 LAB — TYPE AND SCREEN
ABO/RH(D): A NEG
Unit division: 0

## 2013-03-24 LAB — RENAL FUNCTION PANEL
Albumin: 2.4 g/dL — ABNORMAL LOW (ref 3.5–5.2)
CO2: 31 mEq/L (ref 19–32)
Calcium: 8.2 mg/dL — ABNORMAL LOW (ref 8.4–10.5)
Chloride: 97 mEq/L (ref 96–112)
Creatinine, Ser: 6.02 mg/dL — ABNORMAL HIGH (ref 0.50–1.35)
GFR calc Af Amer: 11 mL/min — ABNORMAL LOW (ref >90)
GFR calc non Af Amer: 10 mL/min — ABNORMAL LOW (ref >90)
Phosphorus: 5.5 mg/dL — ABNORMAL HIGH (ref 2.3–4.6)
Sodium: 137 mEq/L (ref 135–145)

## 2013-03-24 LAB — HEMOGLOBIN AND HEMATOCRIT, BLOOD
HCT: 24 % — ABNORMAL LOW (ref 39.0–52.0)
Hemoglobin: 8.1 g/dL — ABNORMAL LOW (ref 13.0–17.0)

## 2013-03-25 LAB — PROTIME-INR: INR: 1.13 (ref 0.00–1.49)

## 2013-03-26 LAB — CATH TIP CULTURE

## 2013-03-26 LAB — PROTIME-INR: Prothrombin Time: 15.6 seconds — ABNORMAL HIGH (ref 11.6–15.2)

## 2013-03-27 LAB — COMPREHENSIVE METABOLIC PANEL
Alkaline Phosphatase: 50 U/L (ref 39–117)
BUN: 72 mg/dL — ABNORMAL HIGH (ref 6–23)
CO2: 27 mEq/L (ref 19–32)
Chloride: 94 mEq/L — ABNORMAL LOW (ref 96–112)
Creatinine, Ser: 5.81 mg/dL — ABNORMAL HIGH (ref 0.50–1.35)
GFR calc Af Amer: 12 mL/min — ABNORMAL LOW (ref >90)
GFR calc non Af Amer: 10 mL/min — ABNORMAL LOW (ref >90)
Glucose, Bld: 126 mg/dL — ABNORMAL HIGH (ref 70–99)
Potassium: 3.4 mEq/L — ABNORMAL LOW (ref 3.5–5.1)
Sodium: 132 mEq/L — ABNORMAL LOW (ref 135–145)
Total Bilirubin: 0.6 mg/dL (ref 0.3–1.2)
Total Protein: 6.7 g/dL (ref 6.0–8.3)

## 2013-03-27 LAB — CBC
HCT: 22.5 % — ABNORMAL LOW (ref 39.0–52.0)
Hemoglobin: 7.4 g/dL — ABNORMAL LOW (ref 13.0–17.0)
MCV: 85.2 fL (ref 78.0–100.0)
RBC: 2.64 MIL/uL — ABNORMAL LOW (ref 4.22–5.81)
WBC: 5.9 10*3/uL (ref 4.0–10.5)

## 2013-03-27 LAB — PROTIME-INR
INR: 1.15 (ref 0.00–1.49)
Prothrombin Time: 14.5 seconds (ref 11.6–15.2)

## 2013-03-28 LAB — CBC
HCT: 22.1 % — ABNORMAL LOW (ref 39.0–52.0)
Hemoglobin: 7.3 g/dL — ABNORMAL LOW (ref 13.0–17.0)
MCH: 28.1 pg (ref 26.0–34.0)
MCHC: 33 g/dL (ref 30.0–36.0)
Platelets: 178 10*3/uL (ref 150–400)
RBC: 2.6 MIL/uL — ABNORMAL LOW (ref 4.22–5.81)
RDW: 13.9 % (ref 11.5–15.5)
WBC: 5.8 10*3/uL (ref 4.0–10.5)

## 2013-03-28 LAB — RENAL FUNCTION PANEL
Albumin: 2.6 g/dL — ABNORMAL LOW (ref 3.5–5.2)
BUN: 51 mg/dL — ABNORMAL HIGH (ref 6–23)
CO2: 31 mEq/L (ref 19–32)
Chloride: 100 mEq/L (ref 96–112)
Creatinine, Ser: 4.08 mg/dL — ABNORMAL HIGH (ref 0.50–1.35)
GFR calc non Af Amer: 15 mL/min — ABNORMAL LOW (ref >90)
Potassium: 3.8 mEq/L (ref 3.5–5.1)
Sodium: 139 mEq/L (ref 135–145)

## 2013-03-28 LAB — PROTIME-INR
INR: 1.17 (ref 0.00–1.49)
Prothrombin Time: 14.7 seconds (ref 11.6–15.2)

## 2013-03-29 LAB — BASIC METABOLIC PANEL
BUN: 48 mg/dL — ABNORMAL HIGH (ref 6–23)
CO2: 27 mEq/L (ref 19–32)
Calcium: 8.7 mg/dL (ref 8.4–10.5)
Creatinine, Ser: 3.57 mg/dL — ABNORMAL HIGH (ref 0.50–1.35)
GFR calc Af Amer: 21 mL/min — ABNORMAL LOW (ref >90)
GFR calc non Af Amer: 18 mL/min — ABNORMAL LOW (ref >90)
Glucose, Bld: 143 mg/dL — ABNORMAL HIGH (ref 70–99)
Sodium: 138 mEq/L (ref 135–145)

## 2013-03-30 LAB — RENAL FUNCTION PANEL
BUN: 45 mg/dL — ABNORMAL HIGH (ref 6–23)
CO2: 29 mEq/L (ref 19–32)
Calcium: 8.7 mg/dL (ref 8.4–10.5)
Chloride: 99 mEq/L (ref 96–112)
Creatinine, Ser: 3.22 mg/dL — ABNORMAL HIGH (ref 0.50–1.35)
Glucose, Bld: 91 mg/dL (ref 70–99)

## 2013-03-30 LAB — HEMOGLOBIN AND HEMATOCRIT, BLOOD: HCT: 25.7 % — ABNORMAL LOW (ref 39.0–52.0)

## 2013-03-30 LAB — PROTIME-INR: INR: 1.2 (ref 0.00–1.49)

## 2013-03-31 LAB — TYPE AND SCREEN
ABO/RH(D): A NEG
Antibody Screen: NEGATIVE
Unit division: 0
Unit division: 0

## 2013-03-31 LAB — PROTIME-INR
INR: 1.16 (ref 0.00–1.49)
Prothrombin Time: 14.6 seconds (ref 11.6–15.2)

## 2013-03-31 LAB — RENAL FUNCTION PANEL
Albumin: 2.8 g/dL — ABNORMAL LOW (ref 3.5–5.2)
Calcium: 9 mg/dL (ref 8.4–10.5)
Chloride: 102 mEq/L (ref 96–112)
GFR calc non Af Amer: 25 mL/min — ABNORMAL LOW (ref >90)
Phosphorus: 4.4 mg/dL (ref 2.3–4.6)
Potassium: 3.6 mEq/L (ref 3.5–5.1)
Sodium: 141 mEq/L (ref 135–145)

## 2013-04-01 LAB — HEPATIC FUNCTION PANEL
ALT: 31 U/L (ref 0–53)
AST: 43 U/L — ABNORMAL HIGH (ref 0–37)
Bilirubin, Direct: 0.3 mg/dL (ref 0.0–0.3)
Indirect Bilirubin: 0.4 mg/dL (ref 0.3–0.9)
Total Bilirubin: 0.7 mg/dL (ref 0.3–1.2)
Total Protein: 7.4 g/dL (ref 6.0–8.3)

## 2013-04-01 LAB — CBC
HCT: 26.8 % — ABNORMAL LOW (ref 39.0–52.0)
Hemoglobin: 8.7 g/dL — ABNORMAL LOW (ref 13.0–17.0)
RBC: 3.13 MIL/uL — ABNORMAL LOW (ref 4.22–5.81)
WBC: 5.8 10*3/uL (ref 4.0–10.5)

## 2013-04-01 LAB — BASIC METABOLIC PANEL
BUN: 29 mg/dL — ABNORMAL HIGH (ref 6–23)
Chloride: 101 mEq/L (ref 96–112)
GFR calc Af Amer: 34 mL/min — ABNORMAL LOW (ref >90)
GFR calc non Af Amer: 29 mL/min — ABNORMAL LOW (ref >90)
Potassium: 3.7 mEq/L (ref 3.5–5.1)

## 2013-04-01 LAB — PROTIME-INR
INR: 1.33 (ref 0.00–1.49)
Prothrombin Time: 16.2 seconds — ABNORMAL HIGH (ref 11.6–15.2)

## 2013-04-02 ENCOUNTER — Other Ambulatory Visit (HOSPITAL_COMMUNITY): Payer: No Typology Code available for payment source

## 2013-04-02 LAB — CBC
HCT: 27.5 % — ABNORMAL LOW (ref 39.0–52.0)
MCV: 85.7 fL (ref 78.0–100.0)
RBC: 3.21 MIL/uL — ABNORMAL LOW (ref 4.22–5.81)
RDW: 13.7 % (ref 11.5–15.5)
WBC: 5.9 10*3/uL (ref 4.0–10.5)

## 2013-04-02 LAB — MAGNESIUM: Magnesium: 1.5 mg/dL (ref 1.5–2.5)

## 2013-04-02 LAB — PROTIME-INR: INR: 1.15 (ref 0.00–1.49)

## 2013-04-02 LAB — HEPATITIS C ANTIBODY: HCV Ab: REACTIVE — AB

## 2013-04-02 LAB — BASIC METABOLIC PANEL
CO2: 31 mEq/L (ref 19–32)
Chloride: 100 mEq/L (ref 96–112)
GFR calc non Af Amer: 33 mL/min — ABNORMAL LOW (ref >90)
Potassium: 3.4 mEq/L — ABNORMAL LOW (ref 3.5–5.1)
Sodium: 141 mEq/L (ref 135–145)

## 2013-04-03 LAB — BASIC METABOLIC PANEL
BUN: 28 mg/dL — ABNORMAL HIGH (ref 6–23)
CO2: 33 mEq/L — ABNORMAL HIGH (ref 19–32)
Calcium: 9.1 mg/dL (ref 8.4–10.5)
GFR calc Af Amer: 43 mL/min — ABNORMAL LOW (ref >90)
GFR calc non Af Amer: 37 mL/min — ABNORMAL LOW (ref >90)
Sodium: 140 mEq/L (ref 135–145)

## 2013-04-03 LAB — CBC
HCT: 28.2 % — ABNORMAL LOW (ref 39.0–52.0)
MCH: 27.5 pg (ref 26.0–34.0)
Platelets: 218 10*3/uL (ref 150–400)
RBC: 3.34 MIL/uL — ABNORMAL LOW (ref 4.22–5.81)
RDW: 13.6 % (ref 11.5–15.5)
WBC: 6.4 10*3/uL (ref 4.0–10.5)

## 2013-04-03 LAB — PROTIME-INR
INR: 1.26 (ref 0.00–1.49)
Prothrombin Time: 15.5 seconds — ABNORMAL HIGH (ref 11.6–15.2)

## 2013-04-04 LAB — PROTIME-INR
INR: 1.11 (ref 0.00–1.49)
Prothrombin Time: 14.1 seconds (ref 11.6–15.2)

## 2013-04-05 ENCOUNTER — Encounter (HOSPITAL_COMMUNITY): Payer: Self-pay | Admitting: Emergency Medicine

## 2013-04-05 ENCOUNTER — Emergency Department (HOSPITAL_COMMUNITY): Payer: No Typology Code available for payment source

## 2013-04-05 ENCOUNTER — Emergency Department (HOSPITAL_COMMUNITY)
Admission: EM | Admit: 2013-04-05 | Discharge: 2013-04-05 | Disposition: A | Discharge status: Home / Self Care | Payer: No Typology Code available for payment source | Attending: Emergency Medicine | Admitting: Emergency Medicine

## 2013-04-05 DIAGNOSIS — Z79899 Other long term (current) drug therapy: Secondary | ICD-10-CM | POA: Diagnosis not present

## 2013-04-05 DIAGNOSIS — Z87448 Personal history of other diseases of urinary system: Secondary | ICD-10-CM | POA: Insufficient documentation

## 2013-04-05 DIAGNOSIS — S32009A Unspecified fracture of unspecified lumbar vertebra, initial encounter for closed fracture: Secondary | ICD-10-CM | POA: Diagnosis not present

## 2013-04-05 DIAGNOSIS — Y92410 Unspecified street and highway as the place of occurrence of the external cause: Secondary | ICD-10-CM | POA: Insufficient documentation

## 2013-04-05 DIAGNOSIS — IMO0002 Reserved for concepts with insufficient information to code with codable children: Secondary | ICD-10-CM | POA: Diagnosis present

## 2013-04-05 DIAGNOSIS — I10 Essential (primary) hypertension: Secondary | ICD-10-CM | POA: Insufficient documentation

## 2013-04-05 DIAGNOSIS — Z9119 Patient's noncompliance with other medical treatment and regimen: Secondary | ICD-10-CM | POA: Diagnosis not present

## 2013-04-05 DIAGNOSIS — Z91199 Patient's noncompliance with other medical treatment and regimen due to unspecified reason: Secondary | ICD-10-CM | POA: Insufficient documentation

## 2013-04-05 DIAGNOSIS — Z87891 Personal history of nicotine dependence: Secondary | ICD-10-CM | POA: Diagnosis not present

## 2013-04-05 DIAGNOSIS — Y9389 Activity, other specified: Secondary | ICD-10-CM | POA: Insufficient documentation

## 2013-04-05 DIAGNOSIS — E119 Type 2 diabetes mellitus without complications: Secondary | ICD-10-CM | POA: Diagnosis not present

## 2013-04-05 DIAGNOSIS — S32010A Wedge compression fracture of first lumbar vertebra, initial encounter for closed fracture: Secondary | ICD-10-CM

## 2013-04-05 HISTORY — DX: Other psychoactive substance abuse, uncomplicated: F19.10

## 2013-04-05 HISTORY — DX: Other chronic pain: G89.29

## 2013-04-05 HISTORY — DX: Patient's noncompliance with other medical treatment and regimen due to unspecified reason: Z91.199

## 2013-04-05 HISTORY — DX: Disorder of kidney and ureter, unspecified: N28.9

## 2013-04-05 HISTORY — DX: Type 2 diabetes mellitus without complications: E11.9

## 2013-04-05 HISTORY — DX: Patient's noncompliance with other medical treatment and regimen: Z91.19

## 2013-04-05 HISTORY — DX: Essential (primary) hypertension: I10

## 2013-04-05 HISTORY — DX: Pain, unspecified: R52

## 2013-04-05 LAB — BASIC METABOLIC PANEL
BUN: 21 mg/dL (ref 6–23)
Calcium: 9.4 mg/dL (ref 8.4–10.5)
Creatinine, Ser: 1.61 mg/dL — ABNORMAL HIGH (ref 0.50–1.35)
GFR calc non Af Amer: 47 mL/min — ABNORMAL LOW (ref >90)
Glucose, Bld: 96 mg/dL (ref 70–99)
Sodium: 139 mEq/L (ref 135–145)

## 2013-04-05 LAB — CBC WITH DIFFERENTIAL/PLATELET
Basophils Absolute: 0 10*3/uL (ref 0.0–0.1)
Basophils Relative: 0 % (ref 0–1)
Eosinophils Absolute: 0.1 10*3/uL (ref 0.0–0.7)
Eosinophils Relative: 2 % (ref 0–5)
Hemoglobin: 9.4 g/dL — ABNORMAL LOW (ref 13.0–17.0)
Lymphs Abs: 2 10*3/uL (ref 0.7–4.0)
MCH: 27.9 pg (ref 26.0–34.0)
MCHC: 32.8 g/dL (ref 30.0–36.0)
Neutro Abs: 4.3 10*3/uL (ref 1.7–7.7)
Neutrophils Relative %: 56 % (ref 43–77)
Platelets: 223 10*3/uL (ref 150–400)
RBC: 3.37 MIL/uL — ABNORMAL LOW (ref 4.22–5.81)
RDW: 13.5 % (ref 11.5–15.5)

## 2013-04-05 LAB — MAGNESIUM: Magnesium: 1.6 mg/dL (ref 1.5–2.5)

## 2013-04-05 LAB — PROTIME-INR: Prothrombin Time: 15 seconds (ref 11.6–15.2)

## 2013-04-05 MED ORDER — CYCLOBENZAPRINE HCL 10 MG PO TABS: 10.0000 mg | ORAL_TABLET | Freq: Three times a day (TID) | ORAL | Status: DC | PRN
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
## 2014-07-16 ENCOUNTER — Emergency Department: Payer: Self-pay | Admitting: Emergency Medicine

## 2014-07-16 LAB — URINALYSIS, COMPLETE
BILIRUBIN, UR: NEGATIVE
Bacteria: NONE SEEN
Blood: NEGATIVE
Ketone: NEGATIVE
LEUKOCYTE ESTERASE: NEGATIVE
Nitrite: NEGATIVE
Ph: 5 (ref 4.5–8.0)
Protein: NEGATIVE
RBC,UR: <1 /HPF (ref 0–5)
SPECIFIC GRAVITY: 1.028 (ref 1.003–1.030)
SQUAMOUS EPITHELIAL: NONE SEEN
WBC UR: <1 /HPF (ref 0–5)

## 2014-07-18 IMAGING — CR DG CHEST 1V PORT
2 series · 2 of 2 positions shown · non-contrast
Comparison: 03/17/2013

CLINICAL DATA: Evaluate for aspiration

PORTABLE CHEST - 1 VIEW

[AP (1 of 2)]
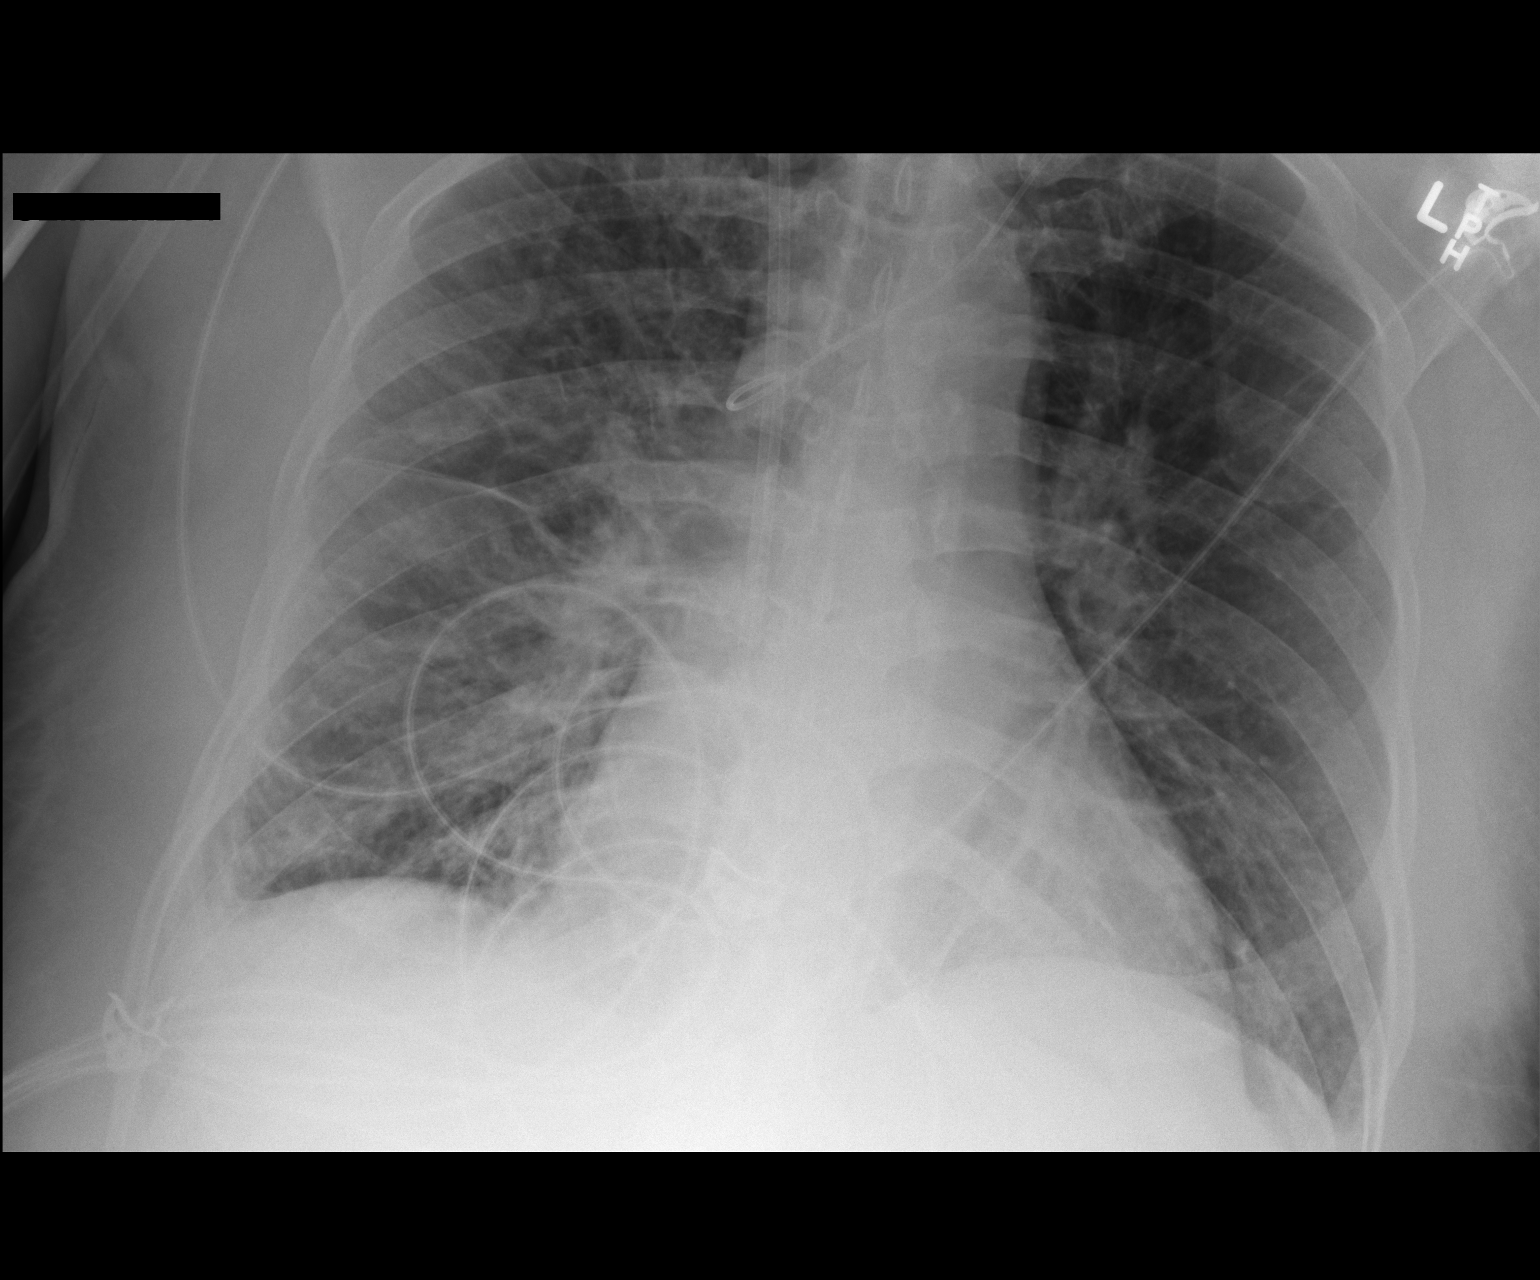

[AP (2 of 2)]
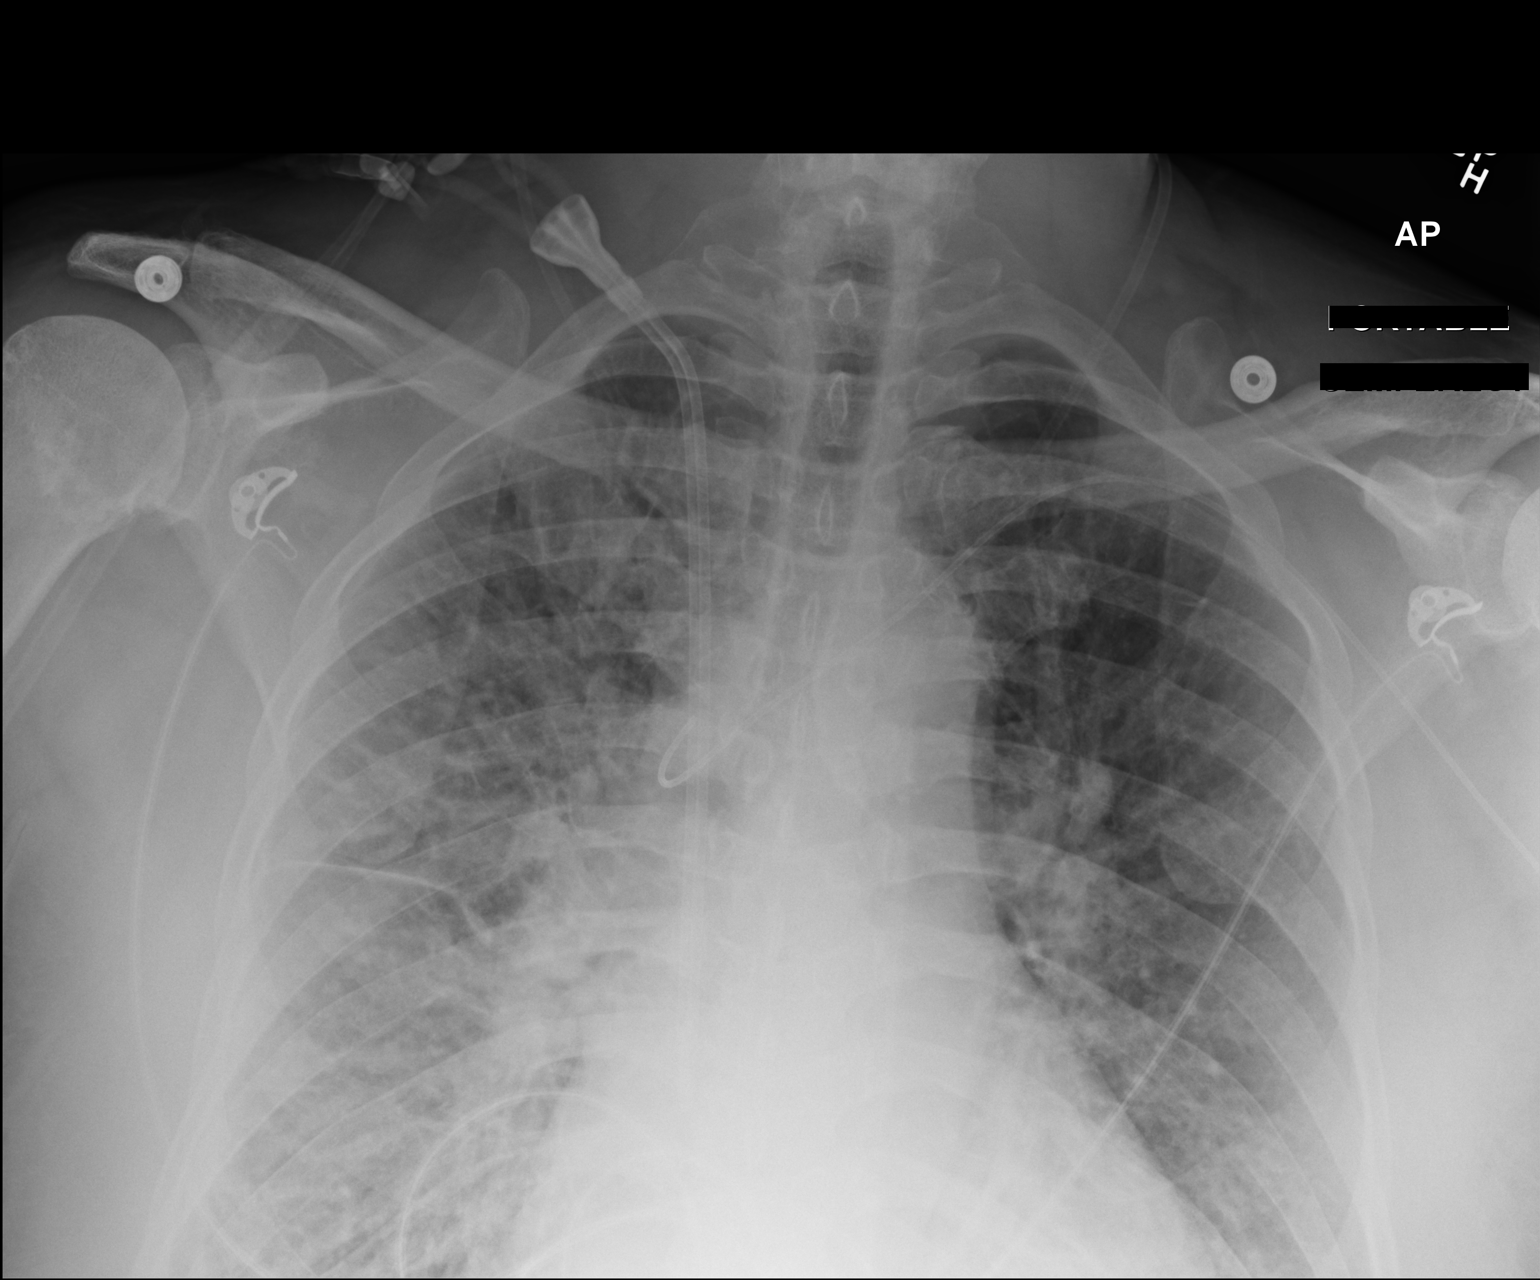

[2 of 2 positions shown; findings below may reference images not displayed]

FINDINGS: Grossly unchanged cardiac silhouette and mediastinal contours.
Stable position of support apparatus including malpositioned tip of
left upper extremity approach PICC line, coiled over the expected
location of the azygos vein.  Minimal improved aeration of the lung
bases.  There is persistent asymmetric interstitial thickening
within the right mid and upper lung.  Trace bilateral effusions are
suspected, right greater than left.  There is a minimal amount of
fluid tracking in the right minor fissure.  No pneumothorax.
Unchanged bones.
IMPRESSION: 1.  Stable positioning of support apparatus including tip of
malpositioned left upper extremity approach PICC line coiled over
the expected location of the azygos vein.
2.  Improved bibasilar atelectasis with persistent asymmetric
interstitial opacities within the right lung with differential
considerations including asymmetric pulmonary edema versus atypical
infection.

## 2014-07-19 IMAGING — CR DG CHEST 1V PORT
1 series · 1 of 1 positions shown · non-contrast
Comparison: 03/18/2013

CLINICAL DATA: Renal failure.

EXAM:
PORTABLE CHEST - 1 VIEW

[AP]
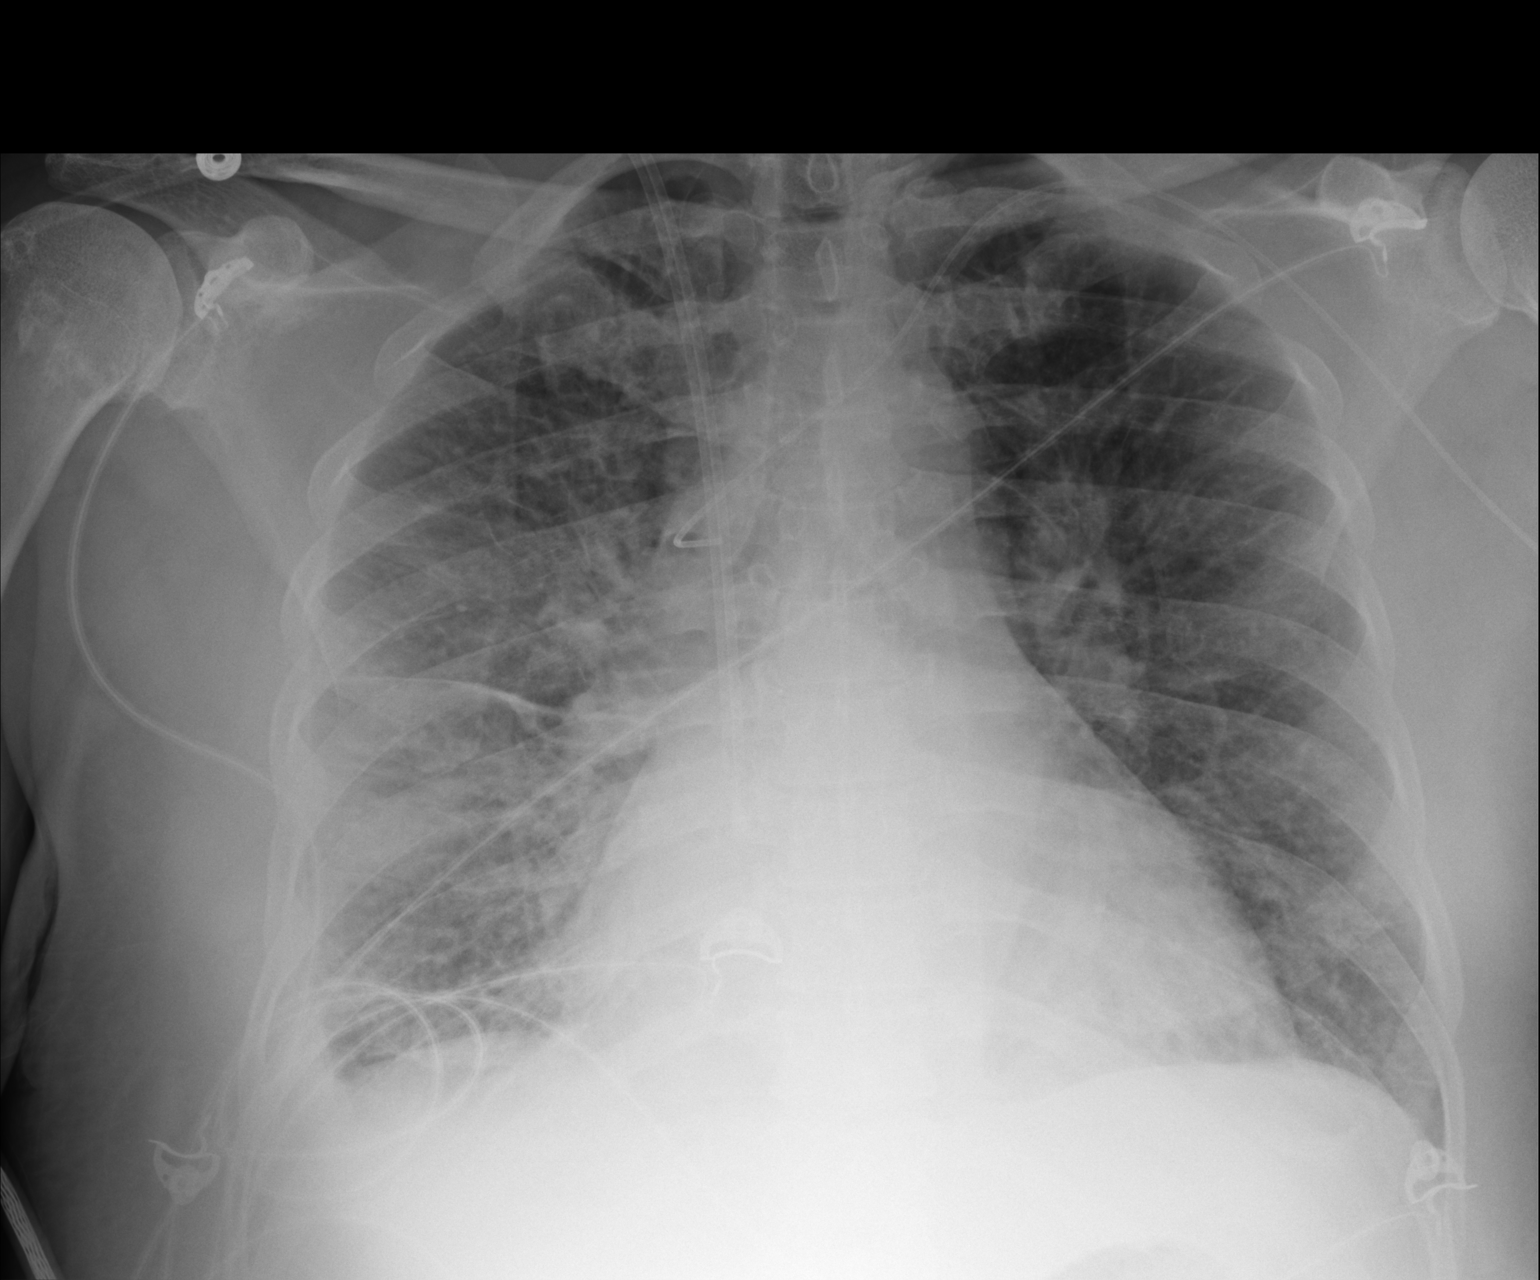

[1 of 1 positions shown; findings below may reference images not displayed]

FINDINGS: Stable right jugular vas cath positioning with the tip at the
cavoatrial junction. Lungs show interstitial edema which is
unchanged since the prior chest x-ray. No significant pleural fluid
identified. The heart is enlarged. Stable positioning of PICC line
with the tip likely deflected azygos orifice.
IMPRESSION: Stable interstitial edema.

## 2014-07-22 IMAGING — XA IR FLUORO RM 0-60 MIN
1 series · 2 of 2 positions shown · non-contrast
Comparison: None.

CLINICAL DATA: Patient with acute renal failure requiring
hemodialysis.  Positive blood cultures.  Request paralysis catheter
removal

IR FLOURO RM 0-60 MIN

[Series 1: run · 2 of 2 slices shown]
[im 1/2]
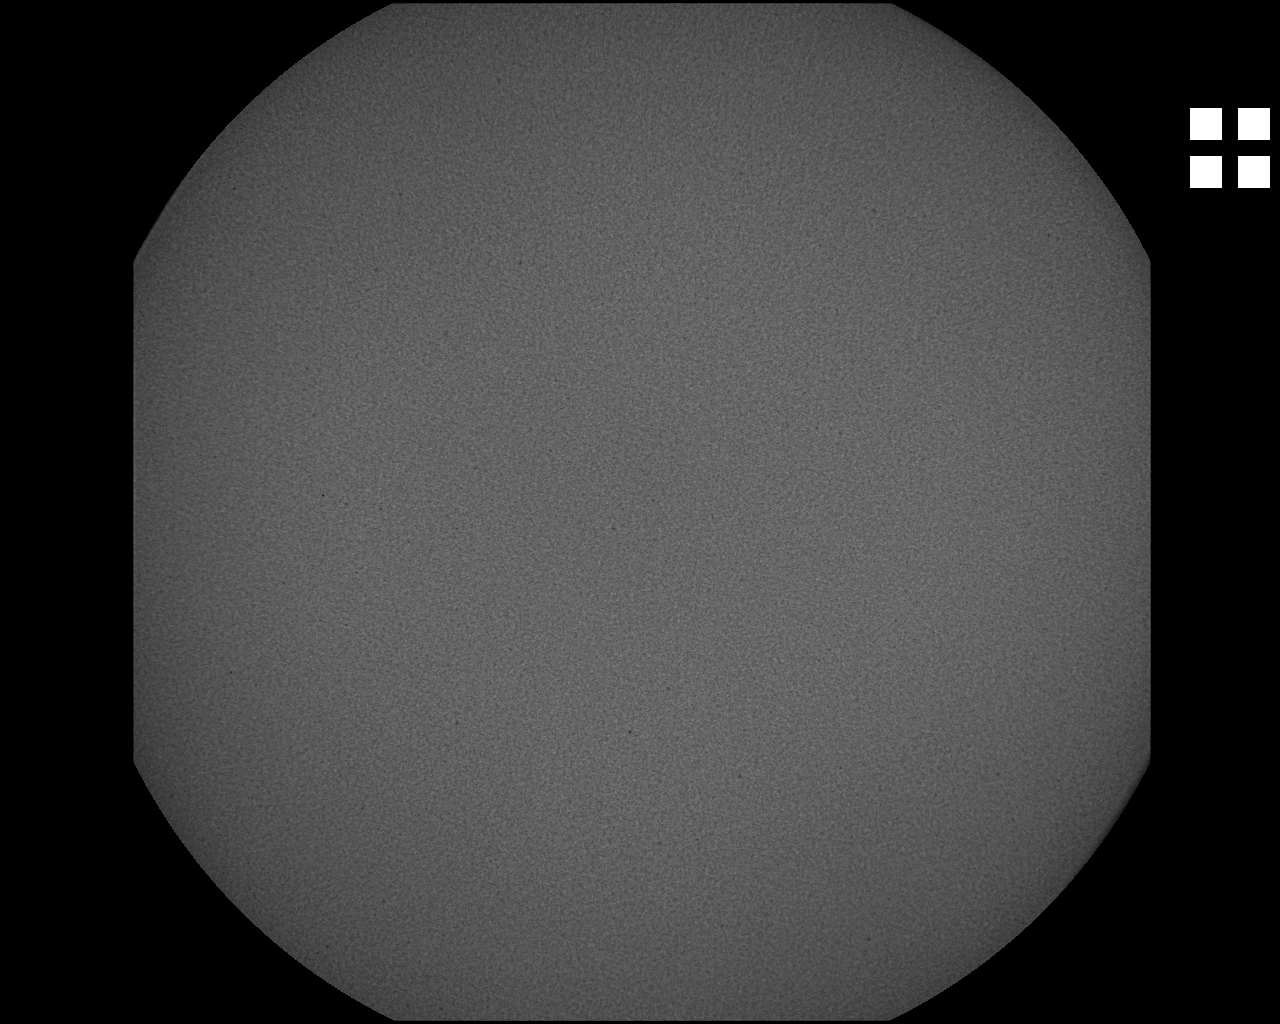
[im 2/2]
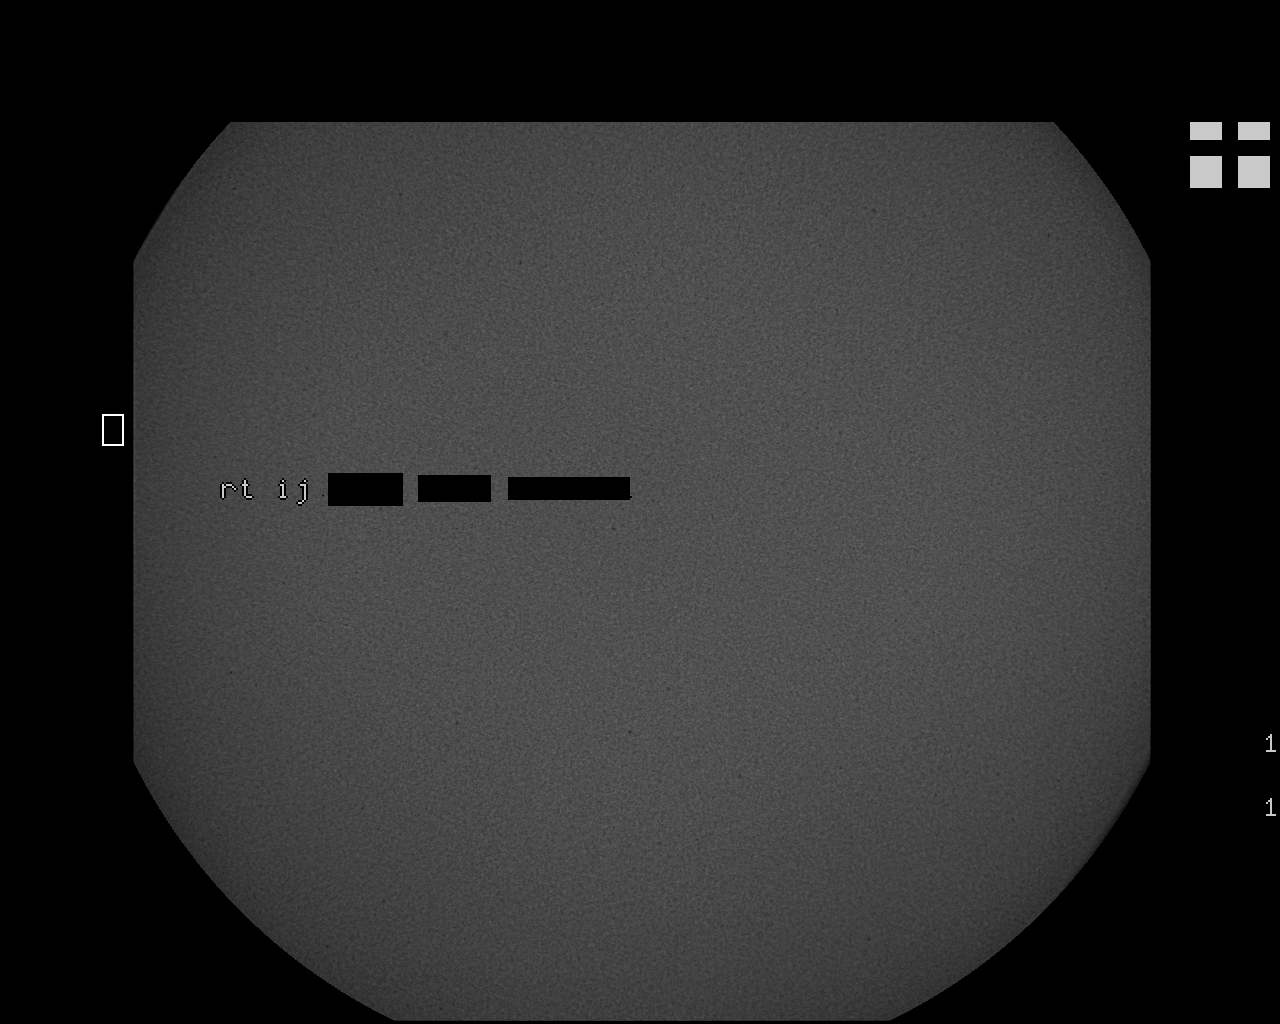

[2 of 2 positions shown; findings below may reference images not displayed]

FINDINGS: Proper technique followed.  The patient placed in
Trendelenburg position. After adequate sterile prep and drape.  The
sutures were removed.  Right internal jugular dialysis catheter was
removed without difficulty.  A sterile occlusive dressing was
applied and pressure held for 5 minutes. No complications
IMPRESSION: Successful removal of right IJ dialysis catheter as described above

Read by: Ceejay, Paulus N,-NAZARETH

## 2014-07-23 IMAGING — XA IR FLUORO GUIDE CV LINE*R*
1 series · 2 of 2 positions shown · non-contrast
Comparison: none

CLINICAL DATA: Renal failure, sepsis, no current access

[Series 1: run · 2 of 2 slices shown]
[im 1/2]
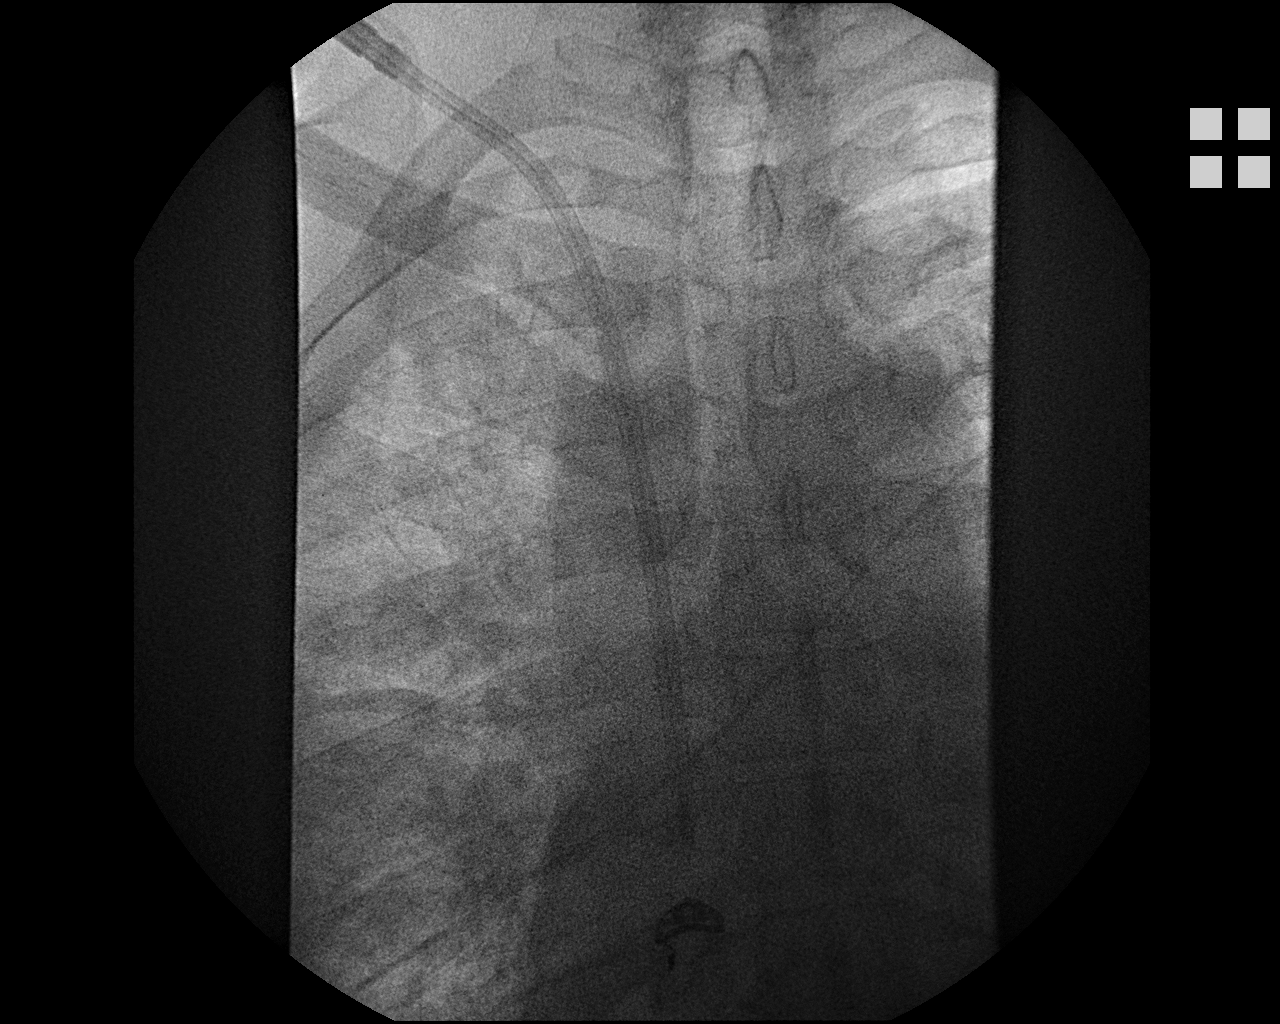
[im 2/2]
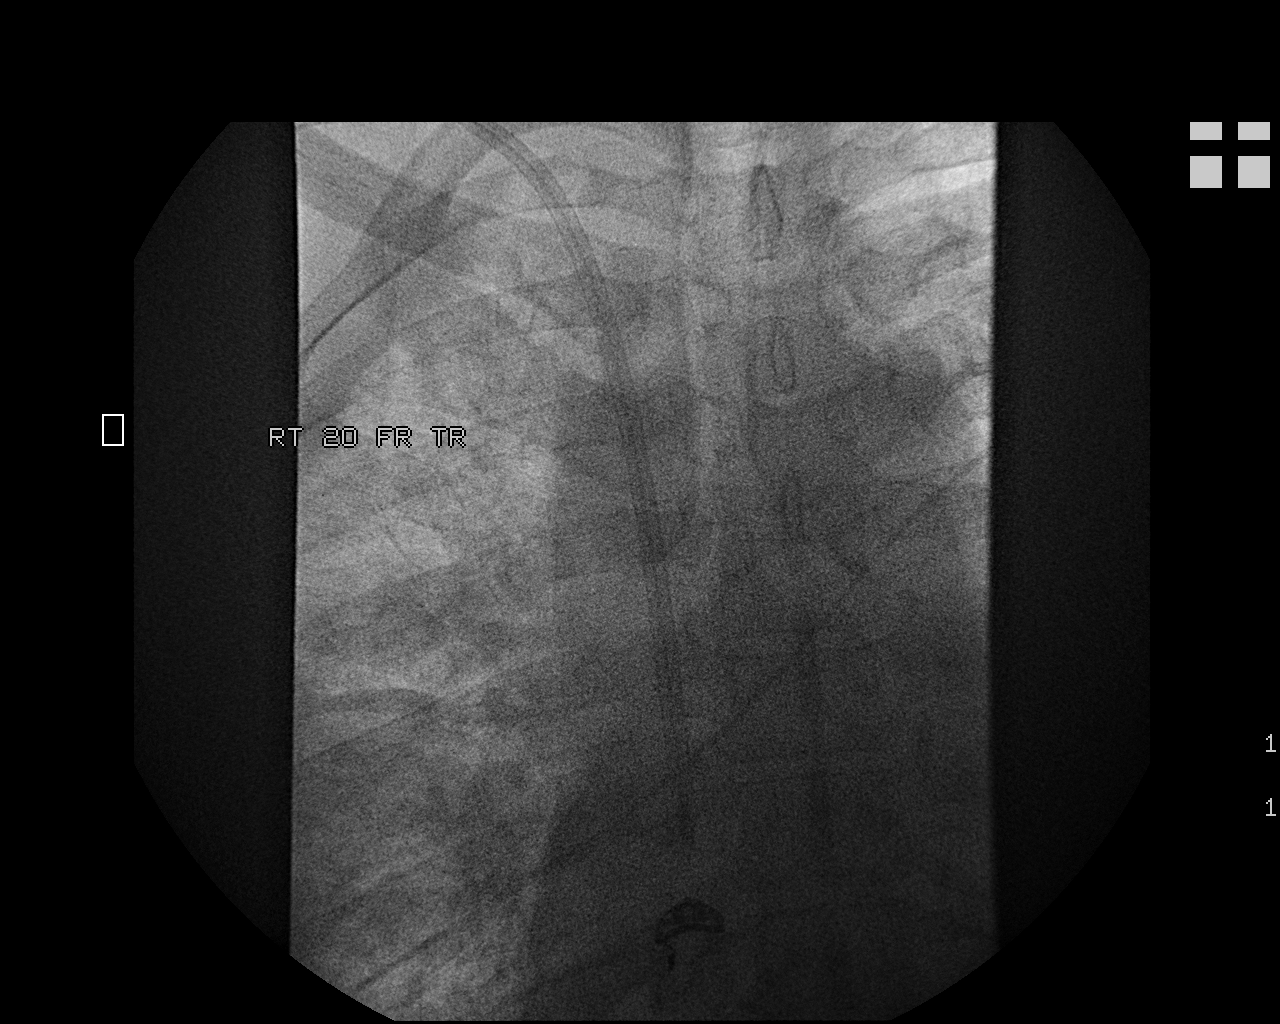

[2 of 2 positions shown; findings below may reference images not displayed]

ULTRASOUND GUIDANCE VASCULAR ACCESS
RIGHT IJ TEMPORARY TRIALYSIS CATHETER

Date:  03/23/2013 [DATE]

Radiologist:  Shimbhu Dkhar, M.D.

Medications:  1% lidocaine locally

Guidance:  Fluoroscopic and ultrasound

Fluoroscopy time:  14 seconds

Sedation time:  None.

Contrast volume:  None.

Complications:  No immediate

PROCEDURE/FINDINGS:

Informed consent was obtained from the patient following
explanation of the procedure, risks, benefits and alternatives.
The patient understands, agrees and consents for the procedure.
All questions were addressed.  A time out was performed.

Maximal barrier sterile technique utilized including caps, mask,
sterile gowns, sterile gloves, large sterile drape, hand hygiene,
and Chloroprep

Under sterile conditions and local anesthesia, ultrasound
micropuncture access was performed of the right internal jugular
vein.  Images obtained for documentation.  Guide wire advanced
centrally without difficulty.  Transitional dilator inserted.
Amplatz guide wire exchange performed.  Tract dilatation performed
to insert a 20 cm temporary trialysis catheter, tip in the SVC RA
junction.  Images obtained for documentation. Blood aspirated
easily followed by saline and heparin flushes.  External caps
applied.  Sterile dressing applied.  Catheter secured with two
retention sutures.  No immediate complication.  The patient
tolerated the procedure well.
IMPRESSION: Successful ultrasound and fluoroscopic right IJ temporary trialysis
catheter.  Tip SVC RA junction.  Ready for use.

## 2014-10-05 NOTE — Op Note (Signed)
PATIENT NAME:  Joshua Gordon, Joshua Gordon MR#:  [id number] DATE OF BIRTH:  [birth year]  DATE OF PROCEDURE:  03/15/2013  PREOPERATIVE DIAGNOSES:  1.  Acute renal failure.  2.  Hypertension.  3.  Diabetes.  POSTOPERATIVE DIAGNOSES: 1.  Acute renal failure.  2.  Hypertension.  3.  Diabetes.  PROCEDURES:  1.  Ultrasound guidance for vascular access, right jugular vein.  2.  Placement of a 20 cm Trialysis type dialysis catheter, right jugular vein.   SURGEON: Festus BarrenJason Airyn Ellzey, M.D.   ANESTHESIA: Local.   ESTIMATED BLOOD LOSS: Minimal.   INDICATION FOR PROCEDURE: A 59 year old [place] male, who was admitted with end-stage renal disease. He had a catheter in his groin, but this has been removed. His renal function has not returned and he needs another dialysis catheter for the initiation of dialysis. Risks and benefits were discussed. Informed consent was obtained.   DESCRIPTION OF PROCEDURE: The patient is laid flat on his floor bed. The right neck was sterilely prepped and draped and a sterile surgical field was created. The right jugular vein was visualized on ultrasound and found to be widely patent. It was then accessed under direct ultrasound guidance without difficulty with a Seldinger needle. A J-wire was placed. After skin nick and dilatation, the 20 cm long Trialysis type dialysis catheter was placed over the wire and wire was removed. The catheter withdrew blood well and flushed easily from all 3 lumens. It was secured in place at approximately 19 cm with 3 nylon sutures,   ____________________________ Annice NeedyJason S. Darius Lundberg, MD jsd:aw D: 03/15/2013 15:01:17 ET T: 03/15/2013 16:48:33 ET JOB#: [id number]  cc: Annice NeedyJason S. Arvie Villarruel, MD, <Dictator> Annice NeedyJASON S Basel Defalco MD ELECTRONICALLY SIGNED 04/05/2013 10:19

## 2014-10-05 NOTE — Consult Note (Signed)
Brief Consult Note: Diagnosis: low back pain.   Patient was seen by consultant.   Comments: will review prior  [facility] xrays and compare to CT He is not painful at L1 level and presume this is old. May have disc space infection, will get sed rate and lumbar MRI.  Electronic Signatures: Leitha SchullerMenz, Hollie Bartus J (MD)  (Signed 21-Sep-14 18:52)  Authored: Brief Consult Note   Last Updated: 21-Sep-14 18:52 by Leitha SchullerMenz, [place] Lebeau J (MD)

## 2014-10-05 NOTE — Consult Note (Signed)
PATIENT NAME:  Joshua Gordon, Pastor R MR#:  [id number] DATE OF BIRTH:  Nov 02, 1955  DATE OF CONSULTATION:  03/06/2013  REFERRING PHYSICIAN:  Aram BeechamJeffrey Sparks, MD CONSULTING PHYSICIAN:  Barnetta ChapelMartin Skulskie, MD / Keturah Barrehristiane H. Jemeka Wagler, NP  HISTORY OF PRESENT ILLNESS:  Appreciate consult for a 59 year old African American man with history of diabetes, hypertension, depression, anxiety and bilateral leg surgeries who was admitted for altered mental status/renal failure/rhabdomyolysis for evaluation of elevated transaminases. The patient is still currently rather drowsy but states no personal or family history of liver disease. Does have history of cocaine and polysubstance abuse, tattoos and incarceration. Had blood transfusion in 1991 when he had back surgery. Denies healthcare work, Financial plannermilitary service, overseas travel, sexual relations with someone who had hepatitis, history of jaundice or ascites and prior dialysis. States that he had some back pain since he fell last month that travels to his umbilical area but no other abdominal pain. States he had some nausea and vomiting yesterday, but this does not seem to be an issue at home or today. Does report some chronic constipation. He denies dyspepsia, melena, hematochezia, diarrhea, problems swallowing or further GI complaints. Do note elevated transaminases, AST greater than ALT, normal bilirubin, normal ALT. Tylenol less than 2. Salicylate level was 3.3 yesterday. Platelets somewhat decreased today, but normal on admit. Hep C and B serologies pending. Has also been evaluated for his renal condition, ARF, by nephrology and has several labs pending with plans to start dialysis during this hospitalization. Orthopedics has seen him to evaluate his L1 compression fracture and possibly has a disk infection and there are plans for psych to evaluate his depression soon. The patient does state he has been rather depressed about his back pain. Do not his ultrasound does have some  gallstones and sludge, but no other liver abnormality. Do appreciate also significantly elevated CK enzymes in the 42,000s, elevated CPK-MBs, normal troponin levels. He also has an echocardiogram pending.   PAST MEDICAL HISTORY: Diabetes, hypertension, depression, anxiety, bilateral leg surgeries, back surgery.   ALLERGIES: No known drug allergies.   OUTPATIENT MEDICATIONS: He does not know. He possibly may be on metformin and possibly may be on something for his blood pressure.   FAMILY HISTORY: Unknown.   SOCIAL HISTORY: Significant for cigarette smoking, polysubstance abuse. Denies alcohol abuse or using any alcohol. Has had prior blood transfusion.  REVIEW OF SYSTEMS:  Ten systems reviewed. Significant for weakness in the legs, depression, feeling fatigued and generally unwell, increasing back pain as noted above. Otherwise, with the exception of what is written in the HPI, systems review is unremarkable.   LABORATORY AND DIAGNOSTICS: Most recent labs: Glucose 249, BUN 33, creatinine 3.77, sodium 136, potassium 5.1, GFR 20, calcium 7.1, phosphorus 6.8, mag 2, cholesterol 203, triglycerides 142, HDL 49. A1c 8.9. Uric acid 8.1. Serum ethanol level is less than 0.03. Total protein 6.3, albumin 2.5, total bilirubin 0.7, direct bili 0.3, ALP 95, AST 1976, ALT 362. CK [id number]. CPK-MB 482.1. Troponins negative x 2, at 0.02 and 0.05.  Ultrasound with possible biliary sludge, possible gallbladder wall thickening. No Murphy's sign. No other liver abnormalities.   Acetaminophen less than 2. Salicylate level 3.3. Urine drug screen positive for benzos, cocaine, methadone, cannabis and opiates. WBC 11.5, hemoglobin 16.4, platelets 126. Red cells are normocytic with increased RDW. His HIV status is negative   PHYSICAL EXAMINATION:  VITAL SIGNS: Most recent: Temp 98.4, pulse 114, respiratory rate 12, blood pressure 153/85, SaO2 90% on 4 liters.  GENERAL:  Ill-appearing drowsy man in no acute distress.   HEENT: Normocephalic, atraumatic. There is some injection to the sclerae and he has some watery drainage from the eyes. There is no scleral icterus. No mouth or nasal sores.  NECK: Supple. No thyromegaly, JVD.  LUNGS:  Respirations eupneic. Lungs CTAB, somewhat diminished in the bases bilaterally.  HEART: S1 and S2, RRR. No MRG. No significant edema.  ABDOMEN: Rounded, soft. Bowel sounds x 4. Nontender, nondistended. No caput, guarding, rigidity, peritoneal signs or other abnormalities. There is no hepatosplenomegaly.  SKIN: Warm, dry, pink. No erythema, lesion or rash.  MUSCULOSKELETAL: Generalized weakness. Does have spontaneous purposeful movement to all extremities. Strength 4 out of 5. Sensation appears to be intact.  NEUROLOGICAL: Drowsy but alert, oriented x 3. Cranial nerves II through XII intact.  PSYCHIATRIC: Flat affect, reasonably cooperative, chooses not to answer some questions.   IMPRESSION AND PLAN: Elevated transaminases, marked AST greater than ALT. Differentials include reactive hepatopathy due to rhabdomyolysis, viral hepatitis, ischemia. Noted echocardiogram is pending. Viral hepatitis serologies are pending. The patient to start dialysis soon. We will add PT-INR today. Recommend daily liver panel and PT-INR. Avoiding liver harming substances. We will follow with you. We also do strongly agree with psych consult for his depression.   Thank you very much for this consult.   This services provided by Vevelyn Pat, MS, NP-C under collaborative agreement with Barnetta Chapel MD with whom I have discussed this patient in full. ____________________________ Keturah Barre, NP chl:sb D: 03/06/2013 16:38:36 ET T: 03/06/2013 17:02:20 ET JOB#: [id number]  cc: Keturah Barre, NP, <Dictator> Eustaquio Maize Harriet Bollen FNP ELECTRONICALLY SIGNED 03/20/2013 10:05

## 2014-10-05 NOTE — Consult Note (Signed)
PATIENT NAME:  Joshua Gordon, Joshua Gordon MR#:  [id number] DATE OF BIRTH:  20-Feb-1956  DATE OF CONSULTATION:  03/06/2013  REFERRING PHYSICIAN:   Dr. Aram BeechamJeffrey Sparks. CONSULTING PHYSICIAN:  Mosetta PigeonHarmeet Carleigh Buccieri, MD  REASON FOR CONSULTATION: Acute renal failure and rhabdomyolysis. Decreased urine output.   HISTORY OF PRESENT ILLNESS:  The patient is a 59 year old African American male with history of type 2 diabetes for about 2 years and long-standing hypertension for over 15 to 20 years. He is not able to provide much history because of his decreased level of consciousness, but his wife is able to give the history. She states that she last talked with him on Saturday night and there were no issues Sunday morning when she came home from work. She found him in the chair at the dining table. He was complaining of back pain, leg pain, weakness, and inability to walk. She then referred the patient to the Emergency Room.   About a month ago, the patient fell down at home chasing his cat and hurt his back. Since then, he has been complaining of back pain. She states that he was evaluated and no fractures were found.  In the Emergency Room, his urine tested positive for benzodiazepines, cocaine, opiates and methadone. His wife denied any knowledge of drug abuse. He was given Narcan which did help his level of consciousness to improve. He was also noted to have an elevated creatinine of 1.96, GFR of 44. Baseline creatinine is not known. Today's creatinine is 3.77. His urine output has been very poor. He only had an output of 410 mL in the last 24 hours. Other lab abnormalities include elevated liver enzymes. His CK level has gone up from 14,900 to greater than 42,000. His hemoglobin level is normal at 16.4. Platelet count was normal yesterday, but today it is low at 126. His urinalysis is positive for 3+ blood but less than 1 RBC. Salicylate level is elevated at 3.3 and acetaminophen level is less than 2.   PAST MEDICAL HISTORY:   Type 2 diabetes for 2 years and hypertension for 15 to 20 years.  Pneumonia 4 ago; [facility] Rod in the left femur in 1991, right knee replacement with hardware.   FAMILY HISTORY: His dad died of stroke.   SOCIAL HISTORY: The patient lives in [place] with his wife. Their daughter lives in [place]. Their son is deceased. His wife's name is Ms. Porfirio MylarKaren Buhrman. The patient is disabled. He used to work in Therapist, sportssheet metal business, and he was an Charity fundraiserair conditioning mechanic. His other hobbies include playing music. He is a bass Psychologist, clinicalguitar player, a Interior and spatial designerkeyboard player and he can play drums and guitar. His wife denies alcohol abuse. She states he takes only prescription drugs. She is not aware of any drug abuse by the patient.   ALLERGIES: No known drug allergies.   MEDICATION LIST: Today includes nitroglycerin ointment, subQ heparin 5000 units every 8 hours, sliding scale insulin, sodium chloride IV infusion.   PHYSICAL EXAMINATION: VITAL SIGNS: Temperature is 98.9. Pulse is 106. Blood pressure is 150/110. Pulse ox 96% on 4 liters nasal cannula.  GENERAL APPEARANCE: The patient is ill appearing. He is lethargic, and he is able to answer only yes and no.  EYES: Anicteric. Oral mucous membranes are somewhat dry.  NECK: Supple. No masses.  LUNGS: Clear to auscultation bilaterally. Normal respiratory effort.  CARDIOVASCULAR: Regular rate and rhythm, slightly tachycardic. No rubs or gallops.  ABDOMEN: Soft, nontender, slightly distended. Bowel sounds are  present.  EXTREMITIES: No significant peripheral edema.  SKIN: No acute rashes noted.  MUSCULOSKELETAL: No acute joint swelling or effusions noted.  NEUROLOGIC: The patient is lethargic. He is able to answer only a few yes or no questions.   LABORATORY DATA: As described above in H and P.   RADIOLOGY: The patient had a CT of the lumbar spine without contrast which showed age-indeterminate L1 vertebral body compression fracture with less than 10%  height loss and lumbar spine spondylosis. His chest x-ray from today is negative for any acute cardiopulmonary disease.   ASSESSMENT AND PLAN: The patient is a 59 year old African American male with history of type 2 diabetes, hypertension, who presents with altered mental status and lower extremity weakness.  1.  Acute renal failure, oliguric, likely secondary to rhabdomyolysis.  We will obtain urine protein to creatinine ratio and serologies. His urinalysis is classic for rhabdomyolysis showing 3+ blood, but no RBCs. His CK level is elevated to greater than 42,000 and rapidly rising. His urine output is very poor, and he has had no significant response to aggressive IV hydration. His serum creatinine is also rapidly increasing, and his potassium level is borderline high. At this point, we recommend starting the patient on dialysis. Risks, benefits and alternatives were discussed with the patient's wife, and she has agreed to proceed. We also discussed that it is our hope that dialysis requirement is temporary at this time, but he may need it for at least 2 weeks, or perhaps longer as necessary. We will evaluate him daily for need for dialysis.  2.  Rhabdomyolysis, question if it is secondary to substance abuse. IV hydration has not helped.  Plan as above.  3.  Hypertension. Consider controlling blood pressure with amlodipine, hydralazine or clonidine.  4.  Diabetes type 2. Avoid metformin. Dose medications for creatinine clearance of less than 10.   We will continue to follow with you.    ____________________________ Mosetta Pigeon, MD hs:dmm D: 03/06/2013 09:47:59 ET T: 03/06/2013 10:43:01 ET JOB#: [id number]  cc: Mosetta Pigeon, MD, <Dictator> [facility]  Fax:  [phone] [facility] MD ELECTRONICALLY SIGNED 03/06/2013 11:28

## 2014-10-05 NOTE — Consult Note (Signed)
Chief Complaint:  Subjective/Chief Complaint patient seen for abnormal liver tests.  seen in dialysis.  denies abdominal pain n or vomiting, tolerating po.   VITAL SIGNS/ANCILLARY NOTES: **Vital Signs.:   25-Sep-14 06:14  Vital Signs Type Routine  Temperature Temperature (F) 98.7  Celsius 37  Pulse Pulse 75  Respirations Respirations 18  Systolic BP Systolic BP 591  Diastolic BP (mmHg) Diastolic BP (mmHg) 81  Mean BP 102  Pulse Ox % Pulse Ox % 90  Pulse Ox Activity Level  At rest  Oxygen Delivery Room Air/ 21 %    13:29  Vital Signs Type off unit   Brief Assessment:  Cardiac Regular   Respiratory clear BS   Gastrointestinal details normal Soft  Nontender  Nondistended  No masses palpable  Bowel sounds normal   Lab Results: Hepatic:  21-Sep-14 08:50   SGPT (ALT)  167  SGOT (AST)  598  22-Sep-14 04:50   SGPT (ALT)  362  SGOT (AST)  1976  23-Sep-14 04:00   SGPT (ALT)  379  SGOT (AST)  1638  24-Sep-14 05:24   SGPT (ALT)  334  SGOT (AST)  1176  25-Sep-14 04:55   Bilirubin, Total 0.7  Alkaline Phosphatase 58  SGPT (ALT)  298  SGOT (AST)  880  Total Protein, Serum  5.5  Albumin, Serum  2.2  Routine Chem:  25-Sep-14 04:55   Glucose, Serum  152  BUN  49  Creatinine (comp)  6.61  Sodium, Serum  132  Potassium, Serum 4.2  Chloride, Serum 100  CO2, Serum 24  Calcium (Total), Serum  7.7  Osmolality (calc) 280  eGFR (African American)  10  eGFR (Non-African American)  9 (eGFR values <41m/min/1.73 m2 may be an indication of chronic kidney disease (CKD). Calculated eGFR is useful in patients with stable renal function. The eGFR calculation will not be reliable in acutely ill patients when serum creatinine is changing rapidly. It is not useful in  patients on dialysis. The eGFR calculation may not be applicable to patients at the low and high extremes of body sizes, pregnant women, and vegetarians.)  Anion Gap 8    13:16   Result Comment TOTAL CK - REPEATED AND  CONFIRMED BY DILUTION  Result(s) reported on 09 Mar 2013 at 02:39PM.  Uric Acid, Serum 6.6 (Result(s) reported on 09 Mar 2013 at 02:30PM.)  Phosphorus, Serum  6.8 (Result(s) reported on 09 Mar 2013 at 02:30PM.)  Cardiac:  25-Sep-14 13:16   CK, Total  20348  Routine Hem:  25-Sep-14 04:55   WBC (CBC) 6.5  RBC (CBC)  3.88  Hemoglobin (CBC)  11.1  Hematocrit (CBC)  31.8  Platelet Count (CBC)  78  MCV 82  MCH 28.5  MCHC 34.8  RDW 13.5  Neutrophil % 67.8  Lymphocyte % 16.2  Monocyte % 14.9  Eosinophil % 0.4  Basophil % 0.7  Neutrophil # 4.4  Lymphocyte # 1.1  Monocyte # 1.0  Eosinophil # 0.0  Basophil # 0.0 (Result(s) reported on 09 Mar 2013 at 05:29AM.)   Radiology Results: [place]    22-Sep-14 10:38, [place]  [place]   REASON FOR EXAM:    abd. pain  COMMENTS:       PROCEDURE: [place] SUR[place]  - Mar 06 2013 10:38AM     RESULT: Abdominal sonogram is performed. There is poor visualization of   portions of the pancreatic head and tail as well is portions of  the body.   The liver echotexture appears normal. Portal venous flow is normal. The   right kidney measures 12.45 x 6.22 x 6.3 to centimeters. The liver length   is 12.49 cm. The portal venous flow is normal. The imaged aorta appears   normal in caliber without aneurysm. There is echogenic nonshadowing   material within the gallbladder consistent with sludge or possibly small   nonshadowing stones. The gallbladder wall thickness is abnormal at 3.8   mm. There is a negative sonographic Murphy's sign. The common bile but   diameter is 4.5 mm. The spleen measures up to 3.12 cm. The proximal     inferior vena cava is patent. The left kidney measures 11.43 x 5.60 x 7.5   cm. There may be a minimal amount of fluid adjacent to the left kidney.   There is no obstruction.    IMPRESSION:   1. Gallbladder wall thickening with sludge and/or small stones in the   gallbladder.  Correlate for evolving cholecystitis. The sonographic   Murphy's sign is negative.  2. Incomplete visualization of the pancreas.  3. Possible minimal fluid adjacent to the left kidney.    Dictation Site: 2        Verified By: Sundra Aland, M.D., MD   Assessment/Plan:  Assessment/Plan:  Assessment 1) abnormal lfts-a/w rhabdomyolysis.  daily improvement of hepatocellular enzyme levels.  Cholelithiasis noted on [place], however no evidence of obstruction or cholecystitis.  2) new diagnosis of chronic hepatitis C.   Plan 1) cryoglobulina ordered re hepatitis C/renal insufficiency.  HC V rna x pcr and genotype  ordered pending 2) continue current. no new GI recs.   Electronic Signatures: Loistine Simas (MD)  (Signed 25-Sep-14 15:00)  Authored: Chief Complaint, VITAL SIGNS/ANCILLARY NOTES, Brief Assessment, Lab Results, Radiology Results, Assessment/Plan   Last Updated: 25-Sep-14 15:00 by Loistine Simas (MD)

## 2014-10-05 NOTE — Consult Note (Signed)
Chief Complaint:  Subjective/Chief Complaint seen for abnormal lfts.  doing well, denies n/v or abdominal pain.   On HD.   VITAL SIGNS/ANCILLARY NOTES: **Vital Signs.:   28-Sep-14 05:00  Vital Signs Type Routine  Temperature Temperature (F) 99.1  Celsius 37.2  Pulse Pulse 72  Respirations Respirations 16  Systolic BP Systolic BP 161  Diastolic BP (mmHg) Diastolic BP (mmHg) 87  Mean BP 108  Pulse Ox % Pulse Ox % 90  Pulse Ox Activity Level  At rest  Oxygen Delivery Room Air/ 21 %   Brief Assessment:  Cardiac Regular   Respiratory clear BS   Gastrointestinal details normal Soft  Nontender  Nondistended  No masses palpable  Bowel sounds normal   Lab Results: Routine Chem:  28-Sep-14 07:59   Glucose, Serum  142  BUN  64  Creatinine (comp)  7.35  Sodium, Serum  131  Potassium, Serum 4.1  Chloride, Serum  96  CO2, Serum 27  Calcium (Total), Serum  8.1  Anion Gap 8  Osmolality (calc) 283  eGFR (African American)  9  eGFR (Non-African American)  8 (eGFR values <80m/min/1.73 m2 may be an indication of chronic kidney disease (CKD). Calculated eGFR is useful in patients with stable renal function. The eGFR calculation will not be reliable in acutely ill patients when serum creatinine is changing rapidly. It is not useful in  patients on dialysis. The eGFR calculation may not be applicable to patients at the low and high extremes of body sizes, pregnant women, and vegetarians.)  Routine Hem:  28-Sep-14 07:59   WBC (CBC) 9.3  RBC (CBC)  3.84  Hemoglobin (CBC)  10.8  Hematocrit (CBC)  31.2  Platelet Count (CBC)  108  MCV 81  MCH 28.2  MCHC 34.7  RDW 13.2  Neutrophil % 66.2  Lymphocyte % 13.2  Monocyte % 17.9  Eosinophil % 2.3  Basophil % 0.4  Neutrophil # 6.2  Lymphocyte # 1.2  Monocyte #  1.7  Eosinophil # 0.2  Basophil # 0.0 (Result(s) reported on 12 Mar 2013 at 08:33AM.)   Radiology Results: [place]    22-Sep-14 10:38, [place]  [place]  General Survey   REASON FOR EXAM:    abd. pain  COMMENTS:       PROCEDURE: [place] GENERAL SURVEY  - Mar 06 2013 10:38AM     RESULT: Abdominal sonogram is performed. There is poor visualization of   portions of the pancreatic head and tail as well is portions of the body.   The liver echotexture appears normal. Portal venous flow is normal. The   right kidney measures 12.45 x 6.22 x 6.3 to centimeters. The liver length   is 12.49 cm. The portal venous flow is normal. The imaged aorta appears   normal in caliber without aneurysm. There is echogenic nonshadowing   material within the gallbladder consistent with sludge or possibly small   nonshadowing stones. The gallbladder wall thickness is abnormal at 3.8   mm. There is a negative sonographic Murphy's sign. The common bile but   diameter is 4.5 mm. The spleen measures up to 3.12 cm. The proximal     inferior vena cava is patent. The left kidney measures 11.43 x 5.60 x 7.5   cm. There may be a minimal amount of fluid adjacent to the left kidney.   There is no obstruction.    IMPRESSION:   1. Gallbladder wall thickening with sludge and/or small stones  in the   gallbladder. Correlate for evolving cholecystitis. The sonographic   Murphy's sign is negative.  2. Incomplete visualization of the pancreas.  3. Possible minimal fluid adjacent to the left kidney.    Dictation Site: 2        Verified By: Sundra Aland, M.D., MD   Assessment/Plan:  Assessment/Plan:  Assessment 1) abnormal lfts in the setting of polydrug abuse and recent severe rhabdomyosysis.  new dx of hepatitis C.  Awaiting results of cryoglobulins lab.   Plan 1) as above, no new GI recs. following.   Electronic Signatures: Loistine Simas (MD)  (Signed 28-Sep-14 13:20)  Authored: Chief Complaint, VITAL SIGNS/ANCILLARY NOTES, Brief Assessment, Lab Results, Radiology Results, Assessment/Plan   Last Updated: 28-Sep-14 13:20 by Loistine Simas (MD)

## 2014-10-05 NOTE — Consult Note (Signed)
Brief Consult Note: Diagnosis: Polysubstance dependence.   Patient was seen by consultant.   Recommend further assessment or treatment.   Comments: Mr. Joshua Gordon was admitted to CCU for AMS. He indicates that he is interested in substance abuse treatment but is unable to participate in psychiatric interview today.   Will return tomorrow to complete interview.  Electronic Signatures: Kristine LineaPucilowska, Kirke Breach (MD)  (Signed 22-Sep-14 16:59)  Authored: Brief Consult Note   Last Updated: 22-Sep-14 16:59 by Kristine LineaPucilowska, Lasheika Ortloff (MD)

## 2014-10-05 NOTE — H&P (Signed)
PATIENT NAME:  Joshua Gordon, Joshua Gordon MR#:  [id number] DATE OF BIRTH:  08-Jun-1956  DATE OF ADMISSION:  03/05/2013  REFERRING PHYSICIAN: Dr.  Lenard Lance.  PRIMARY CARE PHYSICIAN: Dr. Judithann Sheen.   CHIEF COMPLAINT: Brought in for confusion.   HISTORY OF PRESENT ILLNESS: The patient is a 59 year old African American male with  chronic back pain, hypertension, anxiety and a couple of leg surgeries, who was brought in after decreased level of consciousness, weakness in lower extremities. The patient has been tested positive for benzodiazepine, cocaine, opiates, and methadone. When he came in he was hard to arouse, hypoxic and had decreased respiratory rate and pinpoint pupils and was given Narcan.  He has had several doses of Narcan and currently he is awake. He does wake up after Narcan pretty fast. He states that he had a fall a couple of weeks ago and had an x-ray of the back done with [facility]. He has been using substances, possibly to deal with his back pain, but he states that he last used cocaine couple of days ago and he took a friend's methadone. On arrival he was noted to be in renal failure, severe rhabdomyolysis and hospitalist services was contacted  for further evaluation and management. While in the ER, he was also given a total of three doses of Narcan.   PAST MEDICAL HISTORY: Diabetes, hypertension, depression, anxiety bilateral leg surgeries.   ALLERGIES: Denies any drug allergies.   OUTPATIENT MEDICATIONS:  He does not know some of his medications. He thinks he is on metformin and another tablet combined for diabetes, and clonidine 0.1 mg daily for blood pressure and oxycodone 5 mg q.6h. Besides that he does not know.   FAMILY HISTORY: Denies any family history of diabetes, hypertension or CHF.   REVIEW OF SYSTEMS:  CONSTITUTIONAL: No fever. Has weakness in the legs. No weight changes.  EYES: No blurry vision or double vision.  ENT: No tinnitus or snoring.   RESPIRATORY: No  cough, wheezing, or shortness of breath.  CARDIOVASCULAR: No chest pain or swelling in the legs. No arrhythmia. Denies history of hypertension.  GASTROINTESTINAL: No nausea, vomiting, diarrhea, abdominal pain, hematemesis, or dark stools. Denies dysuria or hematuria.  HEMATOLOGIC AND LYMPHATIC: Denies anemia or easy bruising.  SKIN: No rashes.  MUSCULOSKELETAL: Has chronic back pain worsening recently after a fall couple of weeks ago, he states. He has weakness in his lower extremities.  NEUROLOGIC: He has chronic weakness but now he thinks he has a little bit more. He  denies any decreased sensation in his legs and does not have any urinary or bladder incontinence.  PSYCHIATRIC: Has anxiety and depression.   PHYSICAL EXAMINATION: VITAL SIGNS: Temperature was 99.1, pulse rate initially 101, respiratory rate 11, blood pressure 174/96. Oxygen saturation 84% on room air. Currently is  97% on oxygen.  GENERAL: The patient is older  than stated age male lying in bed in mild to moderate pain.  HEENT: Normocephalic, atraumatic. Pupils are equal and reactive to reactive. Anicteric sclerae. Dry mucous membranes.  NECK: Supple. No thyroid tenderness. No cervical lymphadenopathy.  CARDIOVASCULAR: S1, S2 regular rate and rhythm. No murmurs, rubs or gallops.  LUNGS: Cannot auscultate posteriorly as movement causes back pain, but anteriorly clear bilaterally.  ABDOMEN: Soft, nontender, nondistended. Positive bowel sounds in all quadrants.  EXTREMITIES: The patient has no edema, but has multiple healed surgical scars across both legs going up and he appears to have a deformed left knee.  NEUROLOGIC: Cranial nerves II through XII  grossly intact. Strength is 5/5 bilateral upper extremities. Unable to test lower extremities as elicitation of back pain, but he can wiggle toes. Sensation is intact all fields.  PSYCHIATRIC: Awake, alert, oriented a little anxious.  LABS: Glucose 253, BUN 17, creatinine 1.97, sodium  132, potassium 5.1, GFR 44, ethanol level below detection, AST 598, ALT 167, albumin 4.9, bilirubin 0.8, total protein 10.3, CK total of 14,990 CK-MB 221. Troponin negative. TSH 1.07. U-tox as above. WBC 11.6, hemoglobin 16.9, platelets are 176. UA has 3+ blood,  No nitrites or leukocyte esterase, trace bacteria. Salicylates is 3.3, acetaminophen is less than two.   EKG not done. X-ray of the chest not done.   ASSESSMENT AND PLAN: We have a 59 year old male with what appears to be chronic back pain, diabetes, hypertension status post fall a couple of weeks ago with some symptoms of back pain, who comes in with altered mental status and appears to have overdosed on opiates. The patient has positive U-tox also for methadone as well as cocaine. He appears to have severe rhabdomyolysis, acute hypoxic respiratory failure and acute renal failure.  1. In regards to altered mental status, he appears to be neurologically intact and he does wake up rather quickly after Narcan and has had three doses of Narcan. This is likely secondary to metabolic and toxic encephalopathy from renal failure and drug abuse, likely opiates in nature. Provide the patient with Narcan as needed. He also had renal failure and hypoxic respiratory failure.  2. In regards to the renal failure,  this is likely acute from rhabdomyolysis. He has elevated CK total of close to 15,000. We will trend this, start the patient on aggressive IV fluid resuscitation. He has had 2 liters of IV fluids here and was started on 200 mL per hour and recheck a CK total every eight hours. Would monitor the renal function as well.  He appears to have acute respiratory failure with hypoxia on arrival likely secondary to hypoventilation secondary from effect of opiates. He is on oxygen, would continue that. There is no x-ray of the chest, and I start that. He does not have a significant fever or leukocytosis or cough that would suggest he is having a pneumonia. We will  see what the x-ray of the chest shows. The patient also has severe rhabdomyolysis, likely from the falls. Again, would trend those and see how he does with intravenous fluids.  3. He has severe transaminitis. This could be from substance abuse as well. Would trend this tomorrow and if it is persistently up, we would go ahead and obtain on ultrasound.  4. In regards to his back pain, would start a CAT scan of the lumbar spine,  it is lower after a fall a couple of weeks ago. There is no imaging here, but he states there was an x-ray done of the back couple of weeks ago at [facility], perhaps his PCP can follow with that. We will see what the CAT scan shows. Hesitant to give this patient currently opiates at this point, we will see what the CAT scan shows and once he is a little bit more awake we can perhaps start some morphine, but he is at a higher risk of having further symptoms from his overdose. He likely overdosed with opiates and methadone. Possibly to treat his back and self medication, but that is not clear and he is not forthright in giving to much information although his family is in the room.  5.  In regards to his diabetes, I will start him on sliding scale insulin, hold the metformin. I would hold clonidine as well and start him on hydralazine IV p.r.n. We will see what the EKG shows as well. He has elevation of CK-MB likely from the rhabdomyolysis as the CK total is elevated, but troponin is negative.  6. We will start him on a heparin for deep vein thrombosis prophylaxis.   TOTAL CRITICAL CARE TIME SPENT: 60 minutes. The case was discussed with the ER physician, the patient's family and the patient.   ____________________________ Krystal Eaton, MD sa:sg D: 03/05/2013 12:14:59 ET T: 03/05/2013 12:59:20 ET JOB#: [id number]  cc: Krystal Eaton, MD, <Dictator> Krystal Eaton MD ELECTRONICALLY SIGNED 03/30/2013 14:15

## 2014-10-05 NOTE — Consult Note (Signed)
Brief Consult Note: Diagnosis: Right knee pain, TKA 2011.   Patient was seen by consultant.   Consult note dictated.   Comments: No acute change from pain he has had over past three months.  Good motion.  No warmth or erythema.  Xray shows some collapse of tibial component.  No prior radiographs available for follow-up.  Recommend PT, WBAT Recommend follow-up with his operating orthopaedic surgeon at [facility] (?) in [place].  Patient's wife will get information - please assist with arranging follow up appointment prior to his discharge.  Electronic Signatures: Murlean Harkamasunder, Brittny Spangle (MD)  (Signed 30-Sep-14 16:33)  Authored: Brief Consult Note   Last Updated: 30-Sep-14 16:33 by Murlean Harkamasunder, Dariyah Garduno (MD)

## 2014-10-05 NOTE — Consult Note (Signed)
PATIENT NAME:  Joshua Gordon, Alpha R MR#:  [id number] DATE OF BIRTH:  March 26, 1956  DATE OF CONSULTATION:  03/14/2013  REFERRING PHYSICIAN:   CONSULTING PHYSICIAN:  Murlean HarkShalini Noel Henandez, MD  CHIEF COMPLAINT AND HISTORY OF PRESENT ILLNESS: Joshua Gordon is a 59 year old gentleman who has an extensive history of problems with his right knee. He underwent arthroscopic surgery in 2005 followed by total knee arthroplasty using stem and revision components given significant bone loss and degeneration in 2011. The patient states that the surgery was done at [facility] by an orthopedic surgeon who he believes is part of [facility]. He states that his wife does have all of the contact information. He has not followed up with that surgeon in quite some time. He explains that he thought he was doing quite well and then approximately 3 months ago he had fallen and seemed to have back pain as well as knee pain. He denies any associated fevers, redness or warmth of the knee. He does have some warmth of the knee with increased activity.   PHYSICAL EXAMINATION: The right knee is examined. The patient's incision is well healed. No opening, no erythema, no warmth. Diffuse mild swelling about the lower extremity. Mild effusion in the knee. Active range of motion from 0 degrees to 115 degrees without pain or restriction. No pain on medial or lateral joint line palpation. No pain on patellofemoral compression. Calf is soft and nontender.   IMAGING STUDIES: Radiographs of the right knee were reviewed. They demonstrate total knee arthroplasty with well fixed femoral component. Patella component appears well fixed. Tibial component appears to be malaligned with some medial collapse and angulation of the stem. Tibial component has an extension stem as well as what appears to be a revision component. The patient states that this was used on his primary surgery and that this is not a revision total knee  arthroplasty. He explains that revision components were used due to his extensive bone loss.   ASSESSMENT: Chronic pain, right knee, status post total knee arthroplasty followed by fall 3 years later.   PLAN: With no acute change in symptoms, we will have Joshua Gordon seen by physical therapy. He can be weight-bearing as tolerated. They can also focus on some range of motion of the knee. Upon discharge from the hospital, he will need to follow up with his operating orthopedic surgeon in [place]. He will have his wife obtain that information. Prior to discharge, followup appointment with his operating orthopedic surgeon should be arranged on his behalf to ensure adequate followup. The patient demonstrates good insight and understanding of the importance of the followup.  ____________________________ Murlean HarkShalini Vuong Musa, MD sr:sb D: 03/14/2013 16:38:37 ET T: 03/14/2013 17:05:25 ET JOB#: [id number]  cc: Murlean HarkShalini Malaia Buchta, MD, <Dictator> Murlean HarkSHALINI Ladeidra Borys MD ELECTRONICALLY SIGNED 04/20/2013 12:38

## 2014-10-05 NOTE — Consult Note (Signed)
CHIEF COMPLAINT and HISTORY:  Subjective/Chief Complaint ARF   History of Present Illness Patient admitted with rhabdomyolysis, now needs HD.  He is somnulent and answers questions poorly, and history is obtained from medical record.   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:    diabetes:    DEPRESSION:    HTN:    ANXIETY:    left leg fusion:    bilateral rods in legs:   ALLERGIES:  Allergies:  No Known Allergies:   HOME MEDICATIONS:  Home Medications: Medication Instructions Status  clonidine 0.1 mg oral tablet DAILY  Active  oxyCODONE 5 mg oral tablet 1 tab(s) orally every 6 hours Active   Family and Social History:  Family History Non-Contributory   Social History negative tobacco, positive  tobacco (Current within 1 year), previous IVDU   Place of Living Home   Review of Systems:  ROS Pt not able to provide ROS   Physical Exam:  GEN well developed, well nourished   HEENT pink conjunctivae, moist oral mucosa   NECK No masses  trachea midline   RESP normal resp effort  no use of accessory muscles   CARD regular rate  no JVD   VASCULAR ACCESS none   ABD denies tenderness  soft   GU foley catheter in place  urine dark   LYMPH negative neck, negative axillae   EXTR negative cyanosis/clubbing, positive edema   SKIN normal to palpation, No ulcers   NEURO cranial nerves intact, motor/sensory function intact   PSYCH lethargic   LABS:  Laboratory Results: Thyroid:    21-Sep-14 08:50, Thyroid Stimulating Hormone  Thyroid Stimulating Hormone 1.07  0.45-4.50  (International Unit)   -----------------------  Pregnant patients have   different reference   ranges for TSH:   - - - - - - - - - -   Pregnant, first trimetser:   0.36 - 2.50 uIU/mL  LabObservation:    22-Sep-14 09:33, Echo Doppler  OBSERVATION   Reason for Test  Hepatic:    21-Sep-14 08:50, Comprehensive Metabolic Panel   Bilirubin, Total 0.8  Alkaline Phosphatase 128  SGPT (ALT) 167  SGOT (AST) 598  Total Protein, Serum 10.3  Albumin, Serum 4.9    22-Sep-14 04:50, Hepatic Function Panel A  Bilirubin, Total 0.7  Bilirubin, Direct 0.30  Result(s) reported on 06 Mar 2013 at 05:40AM.  Alkaline Phosphatase 95  SGPT (ALT) 362  SGOT (AST) 1976  Total Protein, Serum 6.3  Albumin, Serum 2.5  General Ref:    21-Sep-14 08:50, Acetaminophen, Serum  Acetaminophen, Serum < 2  10-30  POTENTIALLY TOXIC:   > 200 mcg/mL   > 50 mcg/mL at 12 hr after   ingestion   > 300 mcg/mL at 4 hr after   ingestion    10-CHE-52 77:82, Salicylates, Serum  Salicylates, Serum 3.3  0.0-2.8  Therapeutic 2.8-20.0 mg/dL  Toxic >30.0 mg/dL  Routine Chem:    21-Sep-14 08:50, Cardiac Panel  Result Comment   CK.TOTAL - REPEATED AND CONFIRMED BY DILUTION   Result(s) reported on 05 Mar 2013 at 11:45AM.    21-Sep-14 08:50, Comprehensive Metabolic Panel  Glucose, Serum 253  BUN 17  Creatinine (comp) 1.96  Sodium, Serum 132  Potassium, Serum 5.1  Chloride, Serum 96  CO2, Serum 29  Calcium (Total), Serum 9.0  Osmolality (calc) 275  eGFR (African American) 44  eGFR (Non-African American) 38  eGFR values <39m/min/1.73 m2 may be an indication of  chronic  kidney disease (CKD).  Calculated eGFR is useful in patients with stable renal function.  The eGFR calculation will not be reliable in acutely ill patients  when serum creatinine is changing rapidly. It is not useful in   patients on dialysis. The eGFR calculation may not be applicable  to patients at the low and high extremes of body sizes, pregnant  women, and vegetarians.  Anion Gap 7    21-Sep-14 08:50, Ethanol, Serum  Ethanol, S. < 3  Ethanol % (comp) < 0.003  Result(s) reported on 05 Mar 2013 at 09:32AM.    21-Sep-14 21:52, Cardiac Panel  Result Comment   CK - REPEATED AND CONFIRMED BY DILUTION   Result(s) reported on 05 Mar 2013 at 11:32PM.    22-Sep-14 83:15,  Basic Metabolic Panel (w/Total Calcium)  Glucose, Serum 249  BUN 33  Creatinine (comp) 3.77  Sodium, Serum 136  Potassium, Serum 5.1  Chloride, Serum 104  CO2, Serum 24  Calcium (Total), Serum 7.1  Anion Gap 8  Osmolality (calc) 288  eGFR (African American) 20  eGFR (Non-African American) 17  eGFR values <21m/min/1.73 m2 may be an indication of chronic  kidney disease (CKD).  Calculated eGFR is useful in patients with stable renal function.  The eGFR calculation will not be reliable in acutely ill patients  when serum creatinine is changing rapidly. It is not useful in   patients on dialysis. The eGFR calculation may not be applicable  to patients at the low and high extremes of body sizes, pregnant  women, and vegetarians.    22-Sep-14 04:50, Cardiac Panel  Result Comment   CARDIAC PANEL - This specimen was collected through an    - indwelling catheter or arterial line.   - A minimum of 58m of blood was wasted prior     - to collecting the sample.  Interpret   - results with caution.  TOTAL CK - REPEATED AND CONFIRMED BY DILUTION   Result(s) reported on 06 Mar 2013 at 07:44AM.    22-Sep-14 04:50, CBC Profile  Result Comment   LABS - This specimen was collected through an    - indwelling catheter or arterial line.   - A minimum of 7m48mof blood was wasted prior     - to collecting the sample.  Interpret   - results with caution.   Result(s) reported on 06 Mar 2013 at 05:11AM.    22-Sep-14 04:50, Hemoglobin A1c ([facility] Comment   HGB A1C - This specimen was collected through an    - indwelling catheter or arterial line.   - A minimum of 7ml20mf blood was wasted prior     - to collecting the sample.  Interpret   - results with caution.   Result(s) reported on 06 Mar 2013 at 05:48AM.  Hemoglobin A1c ([facility]  The American Diabetes Association recommends that a primary goal of  therapy should be <7% and that physicians should reevaluate the  treatment regimen in  patients with HbA1c values consistently >8%.    22-Sep-14 04:50, Lipid Profile ([facility]CS[facility] Lakeholesterol, Serum 203  Triglycerides, Serum 142  HDL (INHOUSE) 49  VLDL Cholesterol Calculated 28  LDL Cholesterol Calculated 126  Result(s) reported on 06 Mar 2013 at 05:30AM.    22-Sep-14 04:50, Magnesium, Serum  Magnesium, Serum 2.0  1.8-2.4  THERAPEUTIC RANGE: 4-7 mg/dL  TOXIC: > 10 mg/dL   -----------------------    22-Sep-14 09:23, Phosphorus, Serum  Phosphorus, Serum 6.8  Result(s) reported on 06 Mar 2013 at 12:03PM.    22-Sep-14 09:23, Troponin I  Result Comment   TROPONIN - This specimen was collected through an    - indwelling catheter or arterial line.   - A minimum of 31ms of blood was wasted prior     - to collecting the sample.  Interpret   - results with caution.   Result(s) reported on 06 Mar 2013 at 10:24AM.    22-Sep-14 09:23, Uric Acid, Serum  Uric Acid, Serum 8.1  Result(s) reported on 06 Mar 2013 at 12:03PM.  Urine Drugs:    21-Sep-14 08:50, Urine Drug Screen, Qual  Tricyclic Antidepressant, Ur Qual (comp) NEGATIVE  Result(s) reported on 05 Mar 2013 at 10:00AM.  Amphetamines, Urine Qual. NEGATIVE  MDMA, Urine Qual. NEGATIVE  Cocaine Metabolite, Urine Qual. POSITIVE  Opiate, Urine qual POSITIVE  Phencyclidine, Urine Qual. NEGATIVE  Cannabinoid, Urine Qual. POSITIVE  Barbiturates, Urine Qual. NEGATIVE  Benzodiazepine, Urine Qual. POSITIVE  -----------------  The URINE DRUG SCREEN provides only a preliminary, unconfirmed  analytical test result and should not be used for non-medical   purposes.  Clinical consideration and professional judgment should be   applied to any positive drug screen result due to possible  interfering substances.  A more specific alternate chemical method  must be used in order to obtain a confirmed analytical result.  Gas  chromatography/mass spectrometry (GC/MS) is the preferred  confirmatory method.  Methadone, Urine Qual. POSITIVE   Cardiac:    21-Sep-14 08:50, Cardiac Panel  CK, Total 14990  CPK-MB, Serum 221.0    21-Sep-14 08:50, Troponin I  Troponin I < 0.02  0.00-0.05  0.05 ng/mL or less: NEGATIVE   Repeat testing in 3-6 hrs   if clinically indicated.  >0.05 ng/mL: POTENTIAL   MYOCARDIAL INJURY. Repeat   testing in 3-6 hrs if   clinically indicated.  NOTE: An increase or decrease   of 30% or more on serial   testing suggests a   clinically important change    21-Sep-14 21:52, Cardiac Panel  CK, Total 40165  CPK-MB, Serum 670.2    22-Sep-14 04:50, Cardiac Panel  CK, Total 42312  CPK-MB, Serum 482.1    22-Sep-14 09:23, Troponin I  Troponin I 0.05  0.00-0.05  0.05 ng/mL or less: NEGATIVE   Repeat testing in 3-6 hrs   if clinically indicated.  >0.05 ng/mL: POTENTIAL   MYOCARDIAL INJURY. Repeat   testing in 3-6 hrs if   clinically indicated.  NOTE: An increase or decrease   of 30% or more on serial   testing suggests a   clinically important change  Routine UA:    21-Sep-14 08:50, Urinalysis  Color (UA) Amber  Clarity (UA) Cloudy  Glucose (UA) 50 mg/dL  Bilirubin (UA) Negative  Ketones (UA) Trace  Specific Gravity (UA) 1.025  Blood (UA) 3+  pH (UA) 5.0  Protein (UA)   100 mg/dL  Nitrite (UA) Negative  Leukocyte Esterase (UA) Negative  Result(s) reported on 05 Mar 2013 at 09:56AM.  RBC (UA) <1 /HPF  WBC (UA) <1 /HPF  Bacteria (UA) TRACE  Epithelial Cells (UA)   NONE SEEN  Mucous (UA) PRESENT  Amorphous Crystal (UA) PRESENT  Result(s) reported on 05 Mar 2013 at 09:56AM.  Routine Hem:    21-Sep-14 08:50, Hemogram, Platelet Count  WBC (CBC) 11.6  RBC (CBC) 6.04  Hemoglobin (CBC) 16.9  Hematocrit (CBC) 50.8  Platelet Count (CBC) 176  Result(s) reported on 05 Mar 2013 at 09:55AM.  MCV 84  MCH 27.9  MCHC 33.2  RDW 13.7    22-Sep-14 04:50, CBC Profile  WBC (CBC) 11.5  RBC (CBC) 5.93  Hemoglobin (CBC) 16.4  Hematocrit (CBC) 49.7  Platelet Count (CBC) 126  MCV 84  MCH 27.7   MCHC 33.0  RDW 13.6  Neutrophil % 74.5  Lymphocyte % 10.8  Monocyte % 13.8  Eosinophil % 0.0  Basophil % 0.9  Neutrophil # 8.5  Lymphocyte # 1.2  Monocyte # 1.6  Eosinophil # 0.0  Basophil # 0.1    22-Sep-14 04:50, Sedimentation Rate  Erythrocyte Sed Rate 1  Result(s) reported on 06 Mar 2013 at 05:43AM.   RADIOLOGY:  Radiology Results: XRay:    21-Sep-14 12:38, Chest Portable Single View  Chest Portable Single View  REASON FOR EXAM:    hypoxia  COMMENTS:       PROCEDURE: DXR - DXR PORTABLE CHEST SINGLE VIEW  - Mar 05 2013 12:38PM     RESULT: Comparison: None    Findings:     Single portable AP chest radiograph is provided. Trace right pleural   effusion. Mild bilateral interstitial thickening. There is no focal   consolidation or left pleural effusion or pneumothorax. Normal   cardiomediastinal silhouette. The osseous structures are unremarkable.    IMPRESSION:   No acute disease of the chest.    Dictation Site: 1        Verified By: Jennette Banker, M.D., MD    21-Sep-14 20:23, Chest Portable Single View  Chest Portable Single View  REASON FOR EXAM:    PICC line placement  COMMENTS:       PROCEDURE: DXR - DXR PORTABLE CHEST SINGLE VIEW  - Mar 05 2013  8:23PM     RESULT: Comparison: 03/05/2013    Findings:     Single portable AP chest radiograph is provided. There is a right PICC   line with the tip projecting over the right atrium, recommend retracting   the PICC line 5 cm. There is no focal parenchymal opacity, pleural   effusion, or pneumothorax. Normal cardiomediastinal silhouette. The   osseous structures are unremarkable.  IMPRESSION:     There is a right PICC line with the tip projecting over the right atrium;   recommend retracting the PICC line 5 cm.    Dictation Site: 1        Verified By: Jennette Banker, M.D., MD    21-Sep-14 21:03, Chest Portable Single View  Chest Portable Single View  REASON FOR EXAM:    picc line  placement  COMMENTS:       PROCEDURE: DXR - DXR PORTABLE CHEST SINGLE VIEW  - Mar 05 2013  9:03PM     RESULT: Comparison is made to the study of 03/05/2013.    Left upper extremity PIC line remains in place with the tip of the   catheter in the superior vena cava at the junction with the right atrium.   The lungs remain clear. The heart and pulmonary vessels are normal. Bony   structures are unremarkable.    IMPRESSION:   1. No acute cardiopulmonary disease.  Dictation Site: 6        Verified By: Sundra Aland, M.D., MD    22-Sep-14 09:10, Chest Portable Single View  Chest Portable Single View  REASON FOR EXAM:    hypoxia  COMMENTS:       PROCEDURE: DXR - DXR PORTABLE CHEST SINGLE VIEW  - Mar 06 2013  9:10AM     RESULT: Comparison is made to the study of 03/05/2013.    Cardiac monitoring electrodes are present. The lungs are clear. The heart  and pulmonary vessels are normal. The bony and mediastinal structures are   unremarkable. There is no effusion. There is no pneumothorax or evidence   of congestive failure.    IMPRESSION:  No acute cardiopulmonary disease.    Dictation Site: 2    Verified By: Sundra Aland, M.D., MD  [place]:    22-Sep-14 10:38, US Abdomen General Survey  US Abdomen General Survey  REASON FOR EXAM:    abd. pain  COMMENTS:       PROCEDURE: [place]  - US ABDOMEN GENERAL SURVEY  - Mar 06 2013 10:38AM     RESULT: Abdominal sonogram is performed. There is poor visualization of   portions of the pancreatic head and tail as well is portions of the body.   The liver echotexture appears normal. Portal venous flow is normal. The   right kidney measures 12.45 x 6.22 x 6.3 to centimeters. The liver length   is 12.49 cm. The portal venous flow is normal. The imaged aorta appears   normal in caliber without aneurysm. There is echogenic nonshadowing   material within the gallbladder consistent with sludge or possibly small   nonshadowing stones. The  gallbladder wall thickness is abnormal at 3.8   mm. There is a negative sonographic Murphy's sign. The common bile but   diameter is 4.5 mm. The spleen measures up to 3.12 cm. The proximal     inferior vena cava is patent. The left kidney measures 11.43 x 5.60 x 7.5   cm. There may be a minimal amount of fluid adjacent to the left kidney.   There is no obstruction.    IMPRESSION:   1. Gallbladder wall thickening with sludge and/or small stones in the   gallbladder. Correlate for evolving cholecystitis. The sonographic   Murphy's sign is negative.  2. Incomplete visualization of the pancreas.  3. Possible minimal fluid adjacent to the left kidney.    Dictation Site: 2        Verified By: Sundra Aland, M.D., MD  LabUnknown:    21-Sep-14 12:38, Chest Portable Single View  PACS Image    21-Sep-14 12:43, CT Lumbar Spine Without Contrast  PACS Image    21-Sep-14 20:23, Chest Portable Single View  PACS Image    21-Sep-14 21:03, Chest Portable Single View  PACS Image    22-Sep-14 09:10, Chest Portable Single View  PACS Image    22-Sep-14 10:38, US Abdomen General Survey  PACS Image  CT:    21-Sep-14 12:43, CT Lumbar Spine Without Contrast  CT Lumbar Spine Without Contrast  REASON FOR EXAM:    recent fall. back pain  COMMENTS:       PROCEDURE: CT  - CT LUMBAR SPINE WO  - Mar 05 2013 12:43PM     RESULT: Clinical Indication: Fall    Comparison: None    Technique: Multiple axial CT images of the lumbar spine are obtained with   sagittal and coronal reformatted images provided.    Findings:     The alignment is anatomic. There is mild anterior vertebral body height     loss consistent with a compression fracture involving the L1 vertebral   body with less than 10% height loss. The paravertebral soft tissues are   normal. The intraspinal soft tissues are not fully imaged on this  examination due to poor soft tissue contrast, but there is no soft tissue   gross  abnormality.    There is degenerative disc disease throughout the lumbar spine most   severe at L2-L3 and L5-S1. There is sclerosis of the L2, L3, L4 and L5   vertebral bodies likely related to Modic endplate changes. There is   severe facet arthropathy bilaterally at L4-L5 and L5-S1. There is   bilateral foraminal stenosis at L4-L5 and L5-S1. There are broad-based   disc bulges throughout the lumbar spine.    The visualized portions of the lung apices demonstrate no focal   abnormality.  IMPRESSION:       1. Age indeterminate L1 vertebral body compression fracture with less   than 10% height loss.    2. Lumbar spine spondylosis as described above.    Dictation Site: 1        Verified By: Jennette Banker, M.D., MD   ASSESSMENT AND PLAN:  Assessment/Admission Diagnosis ARF   Plan dialysis catheter placed at bedside   level 3   Electronic Signatures: Algernon Huxley (MD)  (Signed 22-Sep-14 12:35)  Authored: Chief Complaint and History, PAST MEDICAL/SURGICAL HISTORY, ALLERGIES, HOME MEDICATIONS, Family and Social History, Review of Systems, Physical Exam, LABS, RADIOLOGY, Assessment and Plan   Last Updated: 22-Sep-14 12:35 by Algernon Huxley (MD)

## 2014-10-05 NOTE — Op Note (Signed)
PATIENT NAME:  Joshua Gordon, Joshua R MR#:  [id number] DATE OF BIRTH:  10-Aug-1955  DATE OF PROCEDURE:  03/06/2013  PREOPERATIVE DIAGNOSES: 1.  Acute renal failure from rhabdomyolysis.  2.  Hypertension.  3.  Anxiety.  4.  Diabetes.  5.  History of IV drug abuse.   POSTOPERATIVE DIAGNOSIS:  1.  Acute renal failure from rhabdomyolysis.  2.  Hypertension.  3.  Anxiety.  4.  Diabetes.  5.  History of IV drug abuse.   PROCEDURES: 1.  Ultrasound guidance for vascular access, right femoral vein.  2.  Placement of a 30 cm Trialysis type dialysis catheter.   SURGEON: Annice NeedyJason S. Zeffie Bickert, M.D.   ANESTHESIA: Local.   ESTIMATED BLOOD LOSS: Minimal.   INDICATION FOR PROCEDURE: A 59 year old African American male admitted to the CCU with acute renal failure from rhabdomyolysis. We are asked to place a dialysis catheter for initiation of hemodialysis.   DESCRIPTION OF PROCEDURE: The patient is laid flat in the critical care bed. The right groin was sterilely prepped and draped and a sterile surgical field was created. The right femoral vein was visualized and found to be patent. It was then accessed under direct ultrasound guidance without difficulty with a Seldinger needle. A J-wire was placed. After skin nick and dilatation, the 37 mm long Trialysis type dialysis catheter was placed over the wire and the wire was removed. All 3 lumens withdrew dark red nonpulsatile blood and flushed easily with sterile saline. It was secured at 29 cm to the skin with 3 nylon sutures. Sterile dressing was placed. The patient tolerated the procedure well. ____________________________ Annice NeedyJason S. Lynnleigh Soden, MD jsd:[id number] D: 03/06/2013 12:37:21 ET T: 03/06/2013 13:53:37 ET JOB#: [id number]  cc: Annice NeedyJason S. Wade Asebedo, MD, <Dictator> Annice NeedyJASON S Jamarl Pew MD ELECTRONICALLY SIGNED 03/08/2013 12:59

## 2014-10-05 NOTE — Consult Note (Signed)
Chief Complaint:  Subjective/Chief Complaint Patient seen and examined, pleas see full GI consult and brief consult note.  Patient admitted with ams, found down in the setting of polysubstance abuse.  Currently to have HD for rhabdomyolysis.  Elevated lfts likely reactive to above but may be baseline liver disease.  Awaiting hepatitis serologies, serial lfts and daily pt.  following.   Electronic Signatures: Barnetta ChapelSkulskie, Martin (MD)  (Signed 22-Sep-14 16:55)  Authored: Chief Complaint   Last Updated: 22-Sep-14 16:55 by Barnetta ChapelSkulskie, Martin (MD)

## 2014-10-05 NOTE — Consult Note (Signed)
Chief Complaint:  Subjective/Chief Complaint Patient seen for abnormal lfts.  Had dialysis last yesterday.  Denies n/v or abdominal pain.   VITAL SIGNS/ANCILLARY NOTES: **Vital Signs.:   26-Sep-14 18:00  Vital Signs Type Q 4hr  Temperature Temperature (F) 98.7  Celsius 37  Temperature Source oral  Respirations Respirations 18  Systolic BP Systolic BP 893  Diastolic BP (mmHg) Diastolic BP (mmHg) 83  Mean BP 104  Pulse Ox % Pulse Ox % 94  Pulse Ox Activity Level  At rest  Oxygen Delivery Room Air/ 21 %   Brief Assessment:  Cardiac Regular   Respiratory clear BS   Gastrointestinal details normal Soft  Nontender  Nondistended  No masses palpable  Bowel sounds normal   Lab Results: Hepatic:  21-Sep-14 08:50   Alkaline Phosphatase 128  SGPT (ALT)  167  SGOT (AST)  598  22-Sep-14 04:50   Alkaline Phosphatase 95  SGPT (ALT)  362  SGOT (AST)  1976  23-Sep-14 04:00   Alkaline Phosphatase 79  SGPT (ALT)  379  SGOT (AST)  1638  24-Sep-14 05:24   Alkaline Phosphatase 62  SGPT (ALT)  334  SGOT (AST)  1176  25-Sep-14 04:55   Alkaline Phosphatase 58  SGPT (ALT)  298  SGOT (AST)  880  26-Sep-14 03:27   Bilirubin, Direct  0.3 (Result(s) reported on 10 Mar 2013 at [facility].)    06:12   Bilirubin, Total 0.9  Bilirubin, Total 0.9  Bilirubin, Direct  0.3 (Result(s) reported on 10 Mar 2013 at 07:10AM.)  Alkaline Phosphatase 60  Alkaline Phosphatase 64  SGPT (ALT)  292  SGPT (ALT)  287  SGOT (AST)  741  SGOT (AST)  744  Total Protein, Serum  5.7  Total Protein, Serum  5.7  Albumin, Serum  2.2  Albumin, Serum  2.3  Routine Chem:  26-Sep-14 06:12   Result Comment CBC/COM METABOLIC/PHOS - This specimen was collected through an   - indwelling catheter or arterial line.  - A minimum of 74ms of blood was wasted prior    - to collecting the sample.  Interpret  - results with caution.  Result(s) reported on 26Sep 2014 at 05:10AM.  Glucose, Serum  158  BUN  45  Creatinine  (comp)  5.90  Sodium, Serum  132  Potassium, Serum 4.3  Chloride, Serum  97  CO2, Serum 26  Calcium (Total), Serum  8.2  Osmolality (calc) 279  eGFR (African American)  12  eGFR (Non-African American)  10 (eGFR values <667mmin/1.73 m2 may be an indication of chronic kidney disease (CKD). Calculated eGFR is useful in patients with stable renal function. The eGFR calculation will not be reliable in acutely ill patients when serum creatinine is changing rapidly. It is not useful in  patients on dialysis. The eGFR calculation may not be applicable to patients at the low and high extremes of body sizes, pregnant women, and vegetarians.)  Anion Gap 9  Phosphorus, Serum  5.5 (Result(s) reported on 10 Mar 2013 at 06:35AM.)  Cardiac:  21-Sep-14 08:50   CK, Total  14990    21:52   CK, Total  40165  22-Sep-14 04:50   CK, Total  42312  23-Sep-14 13:23   CK, Total  39636  24-Sep-14 05:24   CK, Total  31335    16:05   CK, Total  25213  25-Sep-14 13:16   CK, Total  20348  Routine Coag:  26-Sep-14 06:12   Prothrombin 12.7  INR 0.9 (INR reference interval  applies to patients on anticoagulant therapy. A single INR therapeutic range for coumarins is not optimal for all indications; however, the suggested range for most indications is 2.0 - 3.0. Exceptions to the INR Reference Range may include: Prosthetic heart valves, acute myocardial infarction, prevention of myocardial infarction, and combinations of aspirin and anticoagulant. The need for a higher or lower target INR must be assessed individually. Reference: The Pharmacology and Management of the Vitamin K  antagonists: the seventh ACCP Conference on Antithrombotic and Thrombolytic Therapy. XLKGM.0102 Sept:126 (3suppl): [id number]. A HCT value >55% may artifactually increase the PT.  In one study,  the increase was an average of 25%. Reference:  "Effect on Routine and Special Coagulation Testing Values of Citrate Anticoagulant  Adjustment in Patients with High HCT Values." American Journal of Clinical Pathology 2006;126:400-405.)  Routine Hem:  26-Sep-14 06:12   WBC (CBC) 7.7  RBC (CBC)  3.99  Hemoglobin (CBC)  11.3  Hematocrit (CBC)  33.1  Platelet Count (CBC)  91  MCV 83  MCH 28.3  MCHC 34.1  RDW 13.0  Neutrophil % 70.6  Lymphocyte % 12.2  Monocyte % 15.4  Eosinophil % 1.3  Basophil % 0.5  Neutrophil # 5.5  Lymphocyte #  0.9  Monocyte #  1.2  Eosinophil # 0.1  Basophil # 0.0   Radiology Results: [place]:    22-Sep-14 10:38, US Abdomen General Survey  US Abdomen General Survey   REASON FOR EXAM:    abd. pain  COMMENTS:       PROCEDURE: [place]  - US ABDOMEN GENERAL SURVEY  - Mar 06 2013 10:38AM     RESULT: Abdominal sonogram is performed. There is poor visualization of   portions of the pancreatic head and tail as well is portions of the body.   The liver echotexture appears normal. Portal venous flow is normal. The   right kidney measures 12.45 x 6.22 x 6.3 to centimeters. The liver length   is 12.49 cm. The portal venous flow is normal. The imaged aorta appears   normal in caliber without aneurysm. There is echogenic nonshadowing   material within the gallbladder consistent with sludge or possibly small   nonshadowing stones. The gallbladder wall thickness is abnormal at 3.8   mm. There is a negative sonographic Murphy's sign. The common bile but   diameter is 4.5 mm. The spleen measures up to 3.12 cm. The proximal     inferior vena cava is patent. The left kidney measures 11.43 x 5.60 x 7.5   cm. There may be a minimal amount of fluid adjacent to the left kidney.   There is no obstruction.    IMPRESSION:   1. Gallbladder wall thickening with sludge and/or small stones in the   gallbladder. Correlate for evolving cholecystitis. The sonographic   Murphy's sign is negative.  2. Incomplete visualization of the pancreas.  3. Possible minimal fluid adjacent to the left kidney.    Dictation Site:  2        Verified By: Sundra Aland, M.D., MD   Assessment/Plan:  Assessment/Plan:  Assessment 1) abnormal lfts-patietn with rhabdomyolysis, now with new diagnosis of hepatitis C and evidence of asymptomatic cholelithiasis/sludge.   2) rhabdomyolysis-on HD.  some improvement of creat today one day after dialysis. 3) polysubstance abuse.   Plan 1) awaiting lab for cryoglobulins and chronic hepatitis C can occasionally be associated with this and renal affectation. following .;   Electronic Signatures: Loistine Simas (MD)  (Signed 26-Sep-14 20:08)  Authored: Chief Complaint, VITAL SIGNS/ANCILLARY NOTES, Brief Assessment, Lab Results, Radiology Results, Assessment/Plan   Last Updated: 26-Sep-14 20:08 by Loistine Simas (MD)

## 2014-10-05 NOTE — Consult Note (Signed)
Chief Complaint:  Subjective/Chief Complaint patietn  in HD today at rounds.  Placement of dialysis cath noted.  Chart reviewed.  LFTs continue to improve, expect thay will not totally normalize, but hepatocellular enzymes will likely be less above normal but less than 80 at baseline with hepatitis c.  Patient negative for cryoglobulins re Hepatitis C.  Will sign off, would see in fu in GI clinic in a couple weeks post d/c/   Electronic Signatures: Barnetta ChapelSkulskie, Martin (MD)  (Signed 01-Oct-14 22:05)  Authored: Chief Complaint   Last Updated: 01-Oct-14 22:05 by Barnetta ChapelSkulskie, Martin (MD)

## 2014-10-05 NOTE — Consult Note (Signed)
PATIENT NAME:  Joshua Gordon, Joshua Gordon MR#:  [id number] DATE OF BIRTH:  August 20, 1955  DATE OF CONSULTATION:  03/09/2013  CONSULTING PHYSICIAN:  Joshua AmelJohn T. Ellamay Fors, MD  IDENTIFYING INFORMATION AND REASON FOR CONSULT: This is a 59 year old man who presented to the hospital with altered mental status related to overdose of narcotics, also with rhabdomyolysis. Consultation for substance use and depression.   HISTORY OF PRESENT ILLNESS: Information obtained from the patient, the chart, and, to some degree, from the patient's wife. This is a 59 year old man with chronic pain and other medical problems. He was brought into the hospital with severely altered mental status. He was unresponsive for some time. He responded, however, to Narcan, and his drug screen is positive for opiates, benzodiazepines, cocaine, methadone, and also cannabis.   The patient today tells me that he did not think that the overdose was the result of a single dosage of medicine or even a single day. He says that he had been using larger doses than prescribed of narcotics as well as using a friend's methadone for several days. He says he was doing that because his pain was worse. He admits that he had used some cocaine but says that he uses it probably only about once a year. Denies that it is a regular thing. He denies that he was trying to kill himself. He says that most of the time his mood at home is fairly good. He does not feel depressed most of the time. He has positive things in his life that he enjoys.   Recently, apparently, he suffered a fall, and since that time has had worsening back pain that was not controlled by his usual pain medicine. He was frustrated with that. The patient's wife states that in the past, she was able to monitor his narcotic use when she was home, but that more recently, she has been working outside of the home for [employer] and so she has not been around to see what he does during the day nearly as much.    PAST PSYCHIATRIC HISTORY: The patient admits that he has had problems with drugs in the past but says that it was decades ago. He gets defensive and does not want to answer detailed questions about it. He denies that he has ever had any psychiatric problems in the past. Denies any history of suicide attempts. Denies psychiatric hospitalization.   FAMILY HISTORY: Denies any family history of mental illness.   PAST MEDICAL HISTORY: The patient has diabetes and hypertension and chronic pain. It looks like he used to be followed at one point in the pain clinic here, but more recently he has been getting his pain management treatment from his primary care doctor.   He has been getting oxycodones 20 mg strength, 4 a day as well as a fentanyl patch, 25 mcg every 3 days. He is also being prescribed Xanax 1 mg twice a day and recently has had some prescriptions for Ambien.   Currently he is being treated for rhabdomyolysis and is receiving hemodialysis here in the hospital. He and his wife both tell me that their chief concern right now is the recovery of his kidneys and liver.   SOCIAL HISTORY: The patient lives with his wife. She is very active in their church. He is disabled. Does not get out of the house very much, it sounds like, but does putter around playing guitar and enjoying himself at home.   REVIEW OF SYSTEMS: He says he feels very  tired today after getting dialysis. Did not sleep well last night. Still having pain in his back and his legs. He says his mood is feeling irritated. He denies suicidal ideation. Denies any hallucinations.   MENTAL STATUS EXAMINATION: Neatly groomed gentleman who looks his stated age, interviewed in his hospital bed. The patient was passively cooperative with the interview. He made very little eye contact. Psychomotor activity was sluggish. Speech was decreased in total amount, at times very quiet and difficult to understand. Affect was blunted, at times mildly  irritated but not hostile. His mood was stated as being tired. His thoughts did not appear disorganized or bizarre. He was alert and oriented x4. He denied hallucinations. Denied any suicidal or homicidal ideation. He did seem tired and a little bit sluggish in his thinking. Baseline intelligence appears to probably be average. Judgment and insight probably adequate, a little hard to tell because of the passivity of his compliance.   LABORATORY STUDIES: I noted that his drug screen on admission was positive for opiates, methadone, benzodiazepines, cannabis and cocaine. The methadone, cannabis and cocaine are all things that he is not prescribed. The current pain medicines he is receiving are listed above.   ALLERGIES: He has no known drug allergies listed.   ASSESSMENT: A 59 year old man who took an excessive amount of narcotics to the point of knocking himself out and giving himself rhabdomyolysis. I tend to believe him when he says that he was not trying to hurt himself and did not intend to harm himself with the narcotics. He is presenting with a somewhat blunted and irritable affect, but neither he nor his wife report that he had been depressed recently. He does have a distant past history of substance abuse that he is not willing to discuss in much detail. He is insistent that what happened is "a mistake" and not part of a larger pattern and not something that will happened again.   Based on information at hand, I think this is something of a tough situation. Clearly, the patient does have a chronic pain problem. At the same time, I think he is probably an ongoing risk of overuse of narcotic pain medicines because of this incident and his past history.   I am not a pain specialist but might suggest that some modifications could include increasing the strength of his fentanyl patch so that his oral narcotics could be decreased. Fentanyl patches can be abused, but it is harder and less common than with  oral narcotics.   I would suggest that he be enrolled back in a pain clinic if at all possible. In the future, he should probably have random urine drug screens as part of his outpatient treatment. He is not motivated or interested in being referred to substance abuse treatment right now, but I suspect that without some monitoring of this, the problem is likely to continue.   I would not add any other psychiatric medicine right now. I might suggest that the chronic Xanax be cut back or discontinued. There is not a specific indication for it and it does increase the risk of overdose.   I will follow up with the patient as needed.   DIAGNOSIS, PRINCIPAL AND PRIMARY:  AXIS I: Opiate intoxication from overdose, now resolving.   SECONDARY DIAGNOSES:  AXIS I: 1.  Cocaine abuse.     2.  Marijuana abuse.     3.  Opiate abuse.  AXIS II: No diagnosis.  AXIS III: Opiate intoxication, resolving; rhabdomyolysis;  chronic pain; diabetes; hypertension.  AXIS IV: Moderate chronic stress from his impairment.  AXIS V: Functioning at time of evaluation, 40.    ____________________________ Joshua Amel, MD jtc:np D: 03/09/2013 17:47:00 ET T: 03/09/2013 18:43:03 ET JOB#: [id number]  cc: Joshua Amel, MD, <Dictator> Joshua Amel MD ELECTRONICALLY SIGNED 03/10/2013 23:50

## 2014-10-05 NOTE — Discharge Summary (Signed)
PATIENT NAME:  Joshua Gordon, Joshua Gordon MR#:  [id number] DATE OF BIRTH:  [birth year]  DATE OF ADMISSION:  03/05/2013 DATE OF DISCHARGE:  03/16/2013  TYPE OF DISCHARGE: The patient is transferred to [facility] facility.   REASON FOR ADMISSION: Acute renal failure.   HISTORY OF PRESENT ILLNESS: The patient is a 59 year old male with a significant history of chronic pain, narcotics abuse and substance abuse, who presented to the Emergency Room with confusion and altered mental status. In the Emergency Room, the patient was noted to be in acute renal failure. Drug screen was positive for cocaine, opiates, benzodiazepines and cannabinoids. He was lethargic and hypoxic. He was given Narcan with some improvement. He was subsequently admitted for further evaluation.   PAST MEDICAL HISTORY:  1.  Type 2 diabetes.  2.  Benign hypertension.  3.  Anxiety/depression.  4.  Chronic pain.  5.  Substance abuse.  6.  Status post bilateral leg surgeries.   MEDICATIONS ON ADMISSION: Please see admission note.   ALLERGIES: No known drug allergies.   SOCIAL HISTORY: Positive for tobacco, alcohol and illicit drug use. He is married.   FAMILY HISTORY: Negative for diabetes, hypertension or CHF. Negative for colon or prostate cancer.   REVIEW OF SYSTEMS: As per HPI.   PHYSICAL EXAMINATION:  GENERAL: The patient was older-appearing than his stated age, in moderate distress.  VITAL SIGNS: Remarkable for a blood pressure of 174/96, with a heart rate of 101 and temperature of 99.1. Sat was 84% on room air.  HEENT: Unremarkable.  NECK: Supple without JVD.  LUNGS: Revealed decreased breath sounds.  CARDIAC: Regular rate and rhythm. Normal S1, S2.  ABDOMEN: Soft and nontender.  EXTREMITIES: Without edema.  NEUROLOGIC: Grossly nonfocal.   LABS: Revealed a BUN of 17 with a creatinine 1.97 with a CPK of 14,990 and an MB of 221. Troponin was negative.   HOSPITAL COURSE: The patient was admitted with substance abuse and drug  overdose with associated rhabdomyolysis and acute renal failure. He was initially placed in the intensive care unit. The patient's mental status improved after Narcan. He was seen by nephrology and started on dialysis. He was hydrated. He was noted to have abnormal liver function tests as well. He was seen by GI, who recommended conservative therapy. The patient continued to have issues with chronic pain. He was placed on tramadol. The patient underwent essentially daily dialysis via temporary catheter in the right groin. He subsequently developed scrotal and right lower extremity edema. Ultrasound of the right leg was negative for DVT. The right femoral IJ was removed and an IJ was placed in the right neck. He continued dialysis. He was seen daily by nephrology. It was felt that the patient had acute renal failure from his rhabdomyolysis. It is not known whether he will need permanent dialysis at this time. Care management was contacted in regards to disposition and a bed was found at the [facility] center now for continuing dialysis. The patient and family are agreeable to this. He is now transferred to the long term care facility for further hemodialysis and treatment.   DISCHARGE DIAGNOSES:  1.  Acute renal failure.  2.  Rhabdomyolysis.  3.  Benign hypertension.  4.  Type 2 diabetes.  5.  Substance abuse.  6.  Transaminitis, resolved.  7.  Chronic back pain.  8.  Anemia.   DISCHARGE MEDICATIONS:  1.  Benadryl 25 mg p.o. q.4 hours p.r.n. itching.  2.  Low dose sliding scale insulin per protocol with  Accu-Cheks before meals and at bedtime.  3.  Ativan 1 mg p.o. every 6 hours p.r.n. anxiety.  4.  Nitro paste 2 inches topically b.i.d.  5.  Tramadol 50 mg p.o. q.4 hours p.r.n. pain.  6.  Oxycodone 5 mg p.o. q.4 hours p.r.n. pain.  7.  Norvasc 5 mg p.o. daily.  8.  Labetalol  200 mg p.o. b.i.d.  9.  Protonix 40 mg p.o. b.i.d.   FOLLOWUP PLANS AND APPOINTMENTS: The patient will be followed by the  resident physician at the long-term facility. Hemodialysis daily per nephrology. He will be seen in consultation by physical therapy. The right IJ catheter will be cared for per protocol. We will obtain a CBC, MET-C, and a phosphorus q.a.m. x 3 days. He is on a 2 gram sodium, 1800 calorie, ADA diet.  ____________________________ Leonie Douglas. Doy Hutching, MD jds:aw D: 03/16/2013 12:03:03 ET T: 03/16/2013 12:24:05 ET JOB#: [id number]  cc: Leonie Douglas. Doy Hutching, MD, <Dictator> Ayani Ospina Lennice Sites MD ELECTRONICALLY SIGNED 03/16/2013 16:50

## 2014-10-05 NOTE — Consult Note (Signed)
Brief Consult Note: Diagnosis: AMS.   Patient was seen by consultant.   Consult note dictated.   Comments: Appreciate consult for 59 y/o [place] man admitted for AMS/renal failure/rhabdomyolusis for evaluation of elevated transaminases. Patient is rather drowsy, but states no personal or family history of liver disease. Does have history of cocaine and polysubtance abuse, tattoos, and incarceration. Had a blood transfusion in 1991 when he had back surgery. Denies health care work, [employer] service, overseas travels, sexual relations with someone who has hepatitis, history of jaundice/ascites, and prior dialysis.  States that he has had some back pain since he fell last month that travels to his umbilical area, but no other abdominal pain. Had some NV yesterday, but this does not seem to be an issue at home. Does report some chronic constipation. Denies dyspepsia, melena/hematochezia, diarrhea, problems swallowing, further GI complaints. Do note elevated transaminases, AST>ALT, normal bilirubin, normal ALP. Tylenol <2. Salicylates 3.3 yesterday. Platelets somewhat decreased today, but normal on admit. Hep C and B serologies pending. Has also been evaluated by renal for his ARF with several labs pending and plans to start dialysis; ortho has seen as well for L1 compression fracture, possibe disk infection, and there are plans for psych to evaluate due to depression- patient does state he has been rather depressed about his back pain. US abdomen with gallstones and sludge but no other liver abnormality.  Impression and plan: Elevated Transaminases, marked. AST> ALT. Differential includes reactive hepatopathy due to rhabdomyolisis, viral hepatitis, ischemia. Noted echo pending, viral hepatitis serologies pending- patient start dialysis soon. Will add pt/inr today. Recommend daily liver panel and pt/inr, avoiding liver harming substances. Do agree with psych consult.  Electronic Signatures: Vevelyn PatLondon,  Naelani Lafrance H (NP)  (Signed 22-Sep-14 15:02)  Authored: Brief Consult Note   Last Updated: 22-Sep-14 15:02 by Keturah BarreLondon, Jarod Bozzo H (NP)

## 2014-10-05 NOTE — Consult Note (Signed)
Chief Complaint:  Subjective/Chief Complaint admitted wtih ams, seen for abnormal lfts.  doing better, intermittantly somnulent during interview.  events noted.  denies abd pain or nausea, tolerating regualr diet po.   VITAL SIGNS/ANCILLARY NOTES: **Vital Signs.:   24-Sep-14 13:47  Vital Signs Type Routine  Temperature Temperature (F) 98.2  Celsius 36.7  Pulse Pulse 77  Respirations Respirations 19  Systolic BP Systolic BP 818  Diastolic BP (mmHg) Diastolic BP (mmHg) 69  Mean BP 84  Pulse Ox % Pulse Ox % 89  Pulse Ox Activity Level  At rest  Oxygen Delivery 3L   Brief Assessment:  Cardiac Regular   Respiratory clear BS   Gastrointestinal details normal Soft  Nontender  Nondistended  No masses palpable  Bowel sounds normal   Lab Results: Hepatic:  23-Sep-14 04:00   SGPT (ALT)  379  SGOT (AST)  1638  24-Sep-14 05:24   Bilirubin, Total 0.6  Alkaline Phosphatase 62  SGPT (ALT)  334  SGOT (AST)  1176  Total Protein, Serum  5.5  Albumin, Serum  2.2  General Ref:  22-Sep-14 18:25   Hepatitis C Virus Antibody ========== TEST NAME ==========  ========= RESULTS =========  = REFERENCE RANGE =  HCV ANTIBODY  HCV Antibody Hep C Virus Ab                  [H  >11.0 s/co ratio     ]           0.0-0.9 .                                                  Negative:     < 0.8                                             Indeterminate: 0.8 - 0.9                                                  Positive:     > 0.9                                                                      .                  In order to reduce the incidence of a false positive                  result, the CDC recommends that all s/co ratios                  between 1.0 and 10.9 be confirmed by a more specific                  supplemental or PCR testing. [facility] offers HCV Ab  w/Reflex to Verification test [phone].               [facility]            No: [id number]           [address] [place], [place] [address]           Lindon Romp, MD         [phone]   Result(s) reported on 07 Mar 2013 at 04:49PM.  Routine Chem:  24-Sep-14 05:24   Result Comment labs - This specimen was collected through an   - indwelling catheter or arterial line.  - A minimum of 71ms of blood was wasted prior    - to collecting the sample.  Interpret  - results with caution.  Result(s) reported on 08 Mar 2013 at 05:37AM.  Result Comment Total CK - REPEATED AND CONFIRMED BY DILUTION  Result(s) reported on 08 Mar 2013 at 06:12AM.  Glucose, Serum  154  BUN  46  Creatinine (comp)  6.14  Sodium, Serum  134  Potassium, Serum 4.2  Chloride, Serum 101  CO2, Serum 26  Calcium (Total), Serum  7.5  Osmolality (calc) 283  eGFR (African American)  11  eGFR (Non-African American)  9 (eGFR values <640mmin/1.73 m2 may be an indication of chronic kidney disease (CKD). Calculated eGFR is useful in patients with stable renal function. The eGFR calculation will not be reliable in acutely ill patients when serum creatinine is changing rapidly. It is not useful in  patients on dialysis. The eGFR calculation may not be applicable to patients at the low and high extremes of body sizes, pregnant women, and vegetarians.)  Anion Gap 7  Urine Drugs:  23-Sep-14 20:06   Cocaine Metabolite, Urine Qual. POSITIVE  Opiate, Urine qual POSITIVE  Benzodiazepine, Urine Qual. POSITIVE (----------------- The URINE DRUG SCREEN provides only a preliminary, unconfirmed analytical test result and should not be used for non-medical  purposes.  Clinical consideration and professional judgment should be  applied to any positive drug screen result due to possible interfering substances.  A more specific alternate chemical method must be used in order to obtain a confirmed analytical result.  Gas chromatography/mass spectrometry (GC/MS) is the preferred confirmatory method.)  Methadone, Urine Qual.  POSITIVE  Cardiac:  24-Sep-14 05:24   CK, Total  31335  CPK-MB, Serum  66.5  Routine Hem:  24-Sep-14 05:24   WBC (CBC) 7.2  RBC (CBC)  4.05  Hemoglobin (CBC)  11.4  Hematocrit (CBC)  33.6  Platelet Count (CBC)  75  MCV 83  MCH 28.1  MCHC 33.9  RDW 13.5  Neutrophil % 67.1  Lymphocyte % 17.7  Monocyte % 14.8  Eosinophil % 0.2  Basophil % 0.2  Neutrophil # 4.9  Lymphocyte # 1.3  Monocyte #  1.1  Eosinophil # 0.0  Basophil # 0.0   Radiology Results: [place]   22-Sep-14 10:38, [place]dom[place] General Survey  [place] General Survey   REASON FOR EXAM:    abd. pain  COMMENTS:       PROCEDURE: [place]- [place]OM[place] GENERAL SURVEY  - Mar 06 2013 10:38AM     RESULT: Abdominal sonogram is performed. There is poor visualization of   portions of the pancreatic head and tail as well is portions of the body.   The liver echotexture appears normal. Portal venous flow is normal. The   right kidney measures 12.45 x 6.22 x 6.3 to centimeters.  The liver length   is 12.49 cm. The portal venous flow is normal. The imaged aorta appears   normal in caliber without aneurysm. There is echogenic nonshadowing   material within the gallbladder consistent with sludge or possibly small   nonshadowing stones. The gallbladder wall thickness is abnormal at 3.8   mm. There is a negative sonographic Murphy's sign. The common bile but   diameter is 4.5 mm. The spleen measures up to 3.12 cm. The proximal     inferior vena cava is patent. The left kidney measures 11.43 x 5.60 x 7.5   cm. There may be a minimal amount of fluid adjacent to the left kidney.   There is no obstruction.    IMPRESSION:   1. Gallbladder wall thickening with sludge and/or small stones in the   gallbladder. Correlate for evolving cholecystitis. The sonographic   Murphy's sign is negative.  2. Incomplete visualization of the pancreas.  3. Possible minimal fluid adjacent to the left kidney.    Dictation Site: 2        Verified By:  Sundra Aland, M.D., MD   Assessment/Plan:  Assessment/Plan:  Assessment 1) ams-poly drug abuse.  2) abnormal lfts-improving, likely reactive re rhabdomyosysis 3) new diagnosis of hepatitis C.  Review of history with patient indicates h/o ivda from 60 to about 3 years ago.  H/o blood tfx about 1994.   Plan 1) continue curerent.  discussed d/c drug use with patient again.  Hep C virus RNAxpcr and genotype ordered.   Electronic Signatures: Loistine Simas (MD)  (Signed 24-Sep-14 15:09)  Authored: Chief Complaint, VITAL SIGNS/ANCILLARY NOTES, Brief Assessment, Lab Results, Radiology Results, Assessment/Plan   Last Updated: 24-Sep-14 15:09 by Loistine Simas (MD)

## 2014-10-05 NOTE — Consult Note (Signed)
Brief Consult Note: Diagnosis: Opiate intoxication due to accidental overdose resolving.   Patient was seen by consultant.   Consult note dictated.   Recommend to proceed with surgery or procedure.   Comments: Psychiatry: Patient seen. Chart reviewed. Consultation was obtained because this patient came in in a coma from what appeared to be an overdose of opiates. There is also evidence of abuse of other drugs. Question is both about substance abuse and depression. Patient was not very forthcoming historian. Based however on what I could find out I don't think he has a major depression although I do think that his chronic pain probably wears him down quite a bit. I certainly know that he has chronic pain. At the same time I think he is in ongoing risk to overuse opiate pain medicine. I did not put any orders in the chart at this time. Tried to do some supportive and educational therapy. Full note dictated.  Electronic Signatures: Alainah Phang, Jackquline DenmarkJohn T (MD)  (Signed 25-Sep-14 17:33)  Authored: Brief Consult Note   Last Updated: 25-Sep-14 17:33 by Audery Amellapacs, Areeb Corron T (MD)

## 2014-10-05 NOTE — Consult Note (Signed)
Chief Complaint:  Subjective/Chief Complaint alert and oriented, feeling better, denies abdominal pain no nausea or vomiting.   VITAL SIGNS/ANCILLARY NOTES: **Vital Signs.:   23-Sep-14 16:15  Vital Signs Type Post Dialysis  Temperature Temperature (F) 98.2  Celsius 36.7  Temperature Source oral  Pulse Pulse 76  Respirations Respirations 17  Systolic BP Systolic BP 629  Diastolic BP (mmHg) Diastolic BP (mmHg) 75  Mean BP 99  Pulse Ox % Pulse Ox % 97  Pulse Ox Activity Level  At rest  Oxygen Delivery 3L   Brief Assessment:  Cardiac Regular   Respiratory clear BS   Gastrointestinal details normal Soft  Nontender  Nondistended  Bowel sounds normal   Lab Results: Hepatic:  21-Sep-14 08:50   Bilirubin, Total 0.8  Alkaline Phosphatase 128  SGPT (ALT)  167  SGOT (AST)  598  Total Protein, Serum  10.3  Albumin, Serum 4.9  22-Sep-14 04:50   Bilirubin, Direct  0.30 (Result(s) reported on 06 Mar 2013 at 05:40AM.)  Bilirubin, Total 0.7  Alkaline Phosphatase 95  SGPT (ALT)  362  SGOT (AST)  1976  Total Protein, Serum  6.3  Albumin, Serum  2.5  23-Sep-14 04:00   Bilirubin, Direct 0.20 (Result(s) reported on 07 Mar 2013 at 04:28AM.)  Bilirubin, Total 0.5  Alkaline Phosphatase 79  SGPT (ALT)  379  SGOT (AST)  1638  Total Protein, Serum  6.1  Albumin, Serum  2.4  Lab:  23-Sep-14 04:00   O2 Saturation (Pulse Ox) 86  FiO2 (Pulse Ox) RA  General Ref:  22-Sep-14 18:25   Hepatitis B Surface Antibody, Qual ========== TEST NAME ==========  ========= RESULTS =========  = REFERENCE RANGE =  HEPATITIS B SURF.AB,QUAL  Hep B Surface Ab Hep B Surface Ab, Qual          [   Non Reactive         ]                                                Non Reactive: Inconsistent with immunity,                                            less than 10 mIU/mL                              Reactive:     Consistent with immunity,                                            greater than 9.9 mIU/mL                [facility]            No: [id number]           [address], [place], [place] [address]           Lindon Romp, MD         [phone]   Result(s) reported on 07 Mar 2013 at 02:49PM.  Hepatitis B Core Antibody Total ========== TEST NAME ==========  =========  RESULTS =========  = REFERENCE RANGE =  HEPATITIS B CORE AB,TOTL  Hep B Core Ab, Tot Hep B Core Ab, Tot              [   Positive             ]          Negative               [facility]      No: [id number]           [address] [place], [place] [address]           Lindon Romp, MD         [phone]   Result(s) reported on 07 Mar 2013 at 02:49PM.  Hepatitis A Antibody, Total ========== TEST NAME ==========  ========= RESULTS =========  = REFERENCE RANGE =  HEPATITIS A ANTIBDY,TOTL  Hep A Ab, Total Hep A Ab, Total                 [   Negative             ]          Negative               [facility]   No: [id number]           [address], [place], [place] [address]           Lindon Romp, MD         [phone]   Result(s) reported on 07 Mar 2013 at 09:17AM.  ANCA Panel ========== TEST NAME ==========  ========= RESULTS =========  = REFERENCE RANGE =  ANCA PANEL  ANCA Panel Antimyeloperoxidase (MPO) Abs   [   <9.0 U/mL            ]           0.0-9.0 Antiproteinase 3 (PR-3) Abs     [   <3.5 U/mL            ]       0.0-3.5 Cytoplasmic (C-ANCA)            [   <1:20 titer          ]         Neg:<1:20 Perinuclear (P-ANCA)            [   <1:20 titer          ]         Neg:<1:20 The presence of positive fluorescence exhibiting P-ANCA or C-ANCA patterns alone is not specific for the diagnosis of Wegener's Granulomatosis (WG) or microscopic polyangiitis. Decisions about treatment should not be based solely on ANCA IFA results.  The International ANCA Group Consensus recommends follow up testing of positive sera with both PR-3 and MPO-ANCA enzyme  immunoassays. As many as 5% serum samples are positive only by EIA. Ref. AM J Clin Pathol 1999;111:507-513. Atypical pANCA                  [   <1:20 titer          ]         Neg:<1:20 The atypical pANCA pattern has been observed in a significant percentage of patients with ulcerative colitis, primary sclerosing cholangitis and autoimmune hepatitis.               [facility]  No: [id number]           [address], [place], [place] [address]           Lindon Romp, MD         [phone]   Result(s) reported on 07 Mar 2013 at 04:49PM.  Hepatitis C Virus Antibody ========== TEST NAME ==========  ========= RESULTS =========  = REFERENCE RANGE =  HCV ANTIBODY  HCV Antibody Hep C Virus Ab                  [H  >11.0 s/co ratio     ]           0.0-0.9 .                                                  Negative:     < 0.8                                             Indeterminate: 0.8 - 0.9                                                  Positive:     > 0.9                                                                      .                  In order to reduce the incidence of a false positive                  result, the CDC recommends that all s/co ratios                  between 1.0 and 10.9 be confirmed by a more specific                  supplemental or PCR testing. [facility] offers HCV Ab                  w/Reflex to Verification test [phone].               [facility]            No: [id number]           [address], [place], [place] [address]           Lindon Romp, MD         [phone]   Result(s) reported on 07 Mar 2013 at 04:49PM.  Cardiology:  22-Sep-14 08:27   Echo Doppler REASON FOR EXAM:     COMMENTS:     PROCEDURE: [facility] - ECHO DOPPLER COMPLETE(TRANSTHOR)  - Mar 06 2013  8:27AM   RESULT: Echocardiogram Report  Patient Name:   Joshua Gordon Date  of Exam: 03/06/2013 Medical Rec #:  [id number]               Custom1: Date of Birth:  Oct 22, 1955          Height:       75.0 in Patient Age:    59 years            Weight:       221.0 lb Patient Gender: M                   BSA:          2.29 m??  Indications: SOB Sonographer:    Sherrie Sport RDCS Referring Phys:SPARKS, JEFFREY, D  Summary:  1. Left ventricular ejection fraction, by visual estimation, is 45 to  50%.  2. Low normal global left ventricular systolic function.  3. Thickening of the anterior and posterior mitral valve leaflets. 2D AND M-MODE MEASUREMENTS (normal ranges within parentheses): Left Ventricle:          Normal IVSd (2D):      1.07 cm (0.7-1.1) LVPWd (2D):     0.86 cm (0.7-1.1) Aorta/LA:                  Normal LVIDd (2D):     4.45 cm (3.4-5.7) Aortic Root (2D): 3.30 cm (2.4-3.7) LVIDs (2D):     3.33 cm           Left Atrium (2D): 2.80 cm (1.9-4.0) LV FS (2D):     25.2 %   (>25%) LV EF (2D):     49.9 %   (>50%)                                   Right Ventricle:                                   RVd (2D):        2.84 cm LV SYSTOLIC FUNCTION BY 2D PLANIMETRY (MOD): EF-A4C View: 47.5 % EF-A2C View: 45.7 % EF-Biplane: 13.2 % LV DIASTOLIC FUNCTION: MV Peak E: 0.54 m/s E/e' Ratio: 8.80 MV Peak A: 0.70 m/s Decel Time: 215 msec E/A Ratio: 0.78 SPECTRAL DOPPLERANALYSIS (where applicable): Mitral Valve: MV P1/2 Time: 62.35 msec MV Area, PHT: 3.53 cm?? Aortic Valve: AoV Max Vel: 0.92 m/s AoV Peak PG: 3.4 mmHg AoV Mean PG: LVOT Vmax: 0.82 m/s LVOT VTI:  LVOT Diameter: 2.20 cm AoV Area, Vmax: 3.39 cm?? AoVArea, VTI:  AoV Area, Vmn: Tricuspid Valve and PA/RV Systolic Pressure: TR Max Velocity: 1.60 m/s RA  Pressure: 5 mmHg RVSP/PASP: 15.2 mmHg Pulmonic Valve: PV Max Velocity: 0.89 m/s PV Max PG: 3.2 mmHg PV Mean PG:  PHYSICIAN INTERPRETATION: Left Ventricle: The left ventricular internal cavity size was normal. LV  septal wall thickness was normal. LV posterior wall thickness was normal.  Global LV systolic function was low  normal. Left ventricular ejection  fraction, by visual estimation, is 45 to 50%. Right Ventricle: The right ventricular size is normal. Left Atrium: The left atrium is normal in size. Right Atrium: The right atrium is normal in size. Pericardium: There is no evidence of pericardial effusion. Mitral Valve: The mitral valve is normal in structure. There is   thickening of the anterior and posterior mitral valve leaflets. Trace  mitral valve regurgitation is seen. Tricuspid Valve: The tricuspid  valve is normal. Trivial tricuspid  regurgitation is visualized. The tricuspid regurgitant velocity is 1.60  m/s, and with an assumed right atrial pressure of 5 mmHg, the estimated  right ventricular systolic pressure is normal at 15.2 mmHg. Aortic Valve: The aortic valve is normal. No evidence of aortic valve  regurgitation is seen. Pulmonic Valve: Structurally normal pulmonic valve, with normal leaflet  excursion.  [place] MD Electronically signed by [place] Lujean Amel MD Signature Date/Time: 03/06/2013/3:50:18 PM *** Final ***  IMPRESSION: .    Verified By: Yolonda Kida, M.D., MD  Routine Chem:  23-Sep-14 04:00   Result Comment LABS - This specimen was collected through an   - indwelling catheter or arterial line.  - A minimum of 49ms of blood was wasted prior    - to collecting the sample.  Interpret  - results with caution.  Result(s) reported on 07 Mar 2013 at 04:45AM.  Result Comment - Back on O2 @ 4L [place]  Result(s) reported on 07 Mar 2013 at 05:06AM.  Glucose, Serum  162  BUN  40  Creatinine (comp)  5.32  Sodium, Serum  135  Potassium, Serum 4.4  Chloride, Serum 103  CO2, Serum 25  Calcium (Total), Serum  7.6  Osmolality (calc) 283  eGFR (African American)  13  eGFR (Non-African American)  11 (eGFR values <665mmin/1.73 m2 may be an indication of chronic kidney disease (CKD). Calculated eGFR is useful in patients with stable renal function. The eGFR  calculation will not be reliable in acutely ill patients when serum creatinine is changing rapidly. It is not useful in  patients on dialysis. The eGFR calculation may not be applicable to patients at the low and high extremes of body sizes, pregnant women, and vegetarians.)  Anion Gap 7    13:23   Result Comment TOTAL CK - REPEATED AND CONFIRMED BY DILUTION  Result(s) reported on 07 Mar 2013 at 03:47PM.  Phosphorus, Serum  6.8 (Result(s) reported on 07 Mar 2013 at 02:35PM.)  Cardiac:  23-Sep-14 13:23   CK, Total  39636  Routine Coag:  23-Sep-14 04:00   Prothrombin 13.8  INR 1.0 (INR reference interval applies to patients on anticoagulant therapy. A single INR therapeutic range for coumarins is not optimal for all indications; however, the suggested range for most indications is 2.0 - 3.0. Exceptions to the INR Reference Range may include: Prosthetic heart valves, acute myocardial infarction, prevention of myocardial infarction, and combinations of aspirin and anticoagulant. The need for a higher or lower target INR must be assessed individually. Reference: The Pharmacology and Management of the Vitamin K  antagonists: the seventh ACCP Conference on Antithrombotic and Thrombolytic Therapy. ChSTMHD.6222ept:126 (3suppl): [id number] HCT value >55% may artifactually increase the PT.  In one study,  the increase was an average of 25%. Reference:  "Effect on Routine and Special Coagulation Testing Values of Citrate Anticoagulant Adjustment in Patients with High HCT Values." American Journal of Clinical Pathology 2006;126:400-405.)  Routine Hem:  23-Sep-14 04:00   WBC (CBC) 10.6  RBC (CBC) 5.09  Hemoglobin (CBC) 14.2  Hematocrit (CBC) 42.4  Platelet Count (CBC)  90  MCV 83  MCH 28.0  MCHC 33.6  RDW 13.7  Neutrophil % 75.0  Lymphocyte % 9.8  Monocyte % 14.4  Eosinophil % 0.0  Basophil % 0.8  Neutrophil #  7.9  Lymphocyte # 1.0  Monocyte #  1.5  Eosinophil # 0.0  Basophil  # 0.1 (Result(s) reported on 07 Mar 2013  at 04:28AM.)   Radiology Results: [place]:    22-Sep-14 10:38, US Abdomen General Survey  US Abdomen General Survey   REASON FOR EXAM:    abd. pain  COMMENTS:       PROCEDURE: [place]  - US ABDOMEN GENERAL SURVEY  - Mar 06 2013 10:38AM     RESULT: Abdominal sonogram is performed. There is poor visualization of   portions of the pancreatic head and tail as well is portions of the body.   The liver echotexture appears normal. Portal venous flow is normal. The   right kidney measures 12.45 x 6.22 x 6.3 to centimeters. The liver length   is 12.49 cm. The portal venous flow is normal. The imaged aorta appears   normal in caliber without aneurysm. There is echogenic nonshadowing   material within the gallbladder consistent with sludge or possibly small   nonshadowing stones. The gallbladder wall thickness is abnormal at 3.8   mm. There is a negative sonographic Murphy's sign. The common bile but   diameter is 4.5 mm. The spleen measures up to 3.12 cm. The proximal     inferior vena cava is patent. The left kidney measures 11.43 x 5.60 x 7.5   cm. There may be a minimal amount of fluid adjacent to the left kidney.   There is no obstruction.    IMPRESSION:   1. Gallbladder wall thickening with sludge and/or small stones in the   gallbladder. Correlate for evolving cholecystitis. The sonographic   Murphy's sign is negative.  2. Incomplete visualization of the pancreas.  3. Possible minimal fluid adjacent to the left kidney.    Dictation Site: 2        Verified By: Sundra Aland, M.D., MD   Assessment/Plan:  Assessment/Plan:  Assessment 1) abnormal lfts-mild improvement, likely secondary to effects of rhabdomyolysis.  testing also indicated positive hepatitis C ab. Abdominal ultrasound noted.  2) polysubstance abuse   Plan 1) continue current, recheck labs in am. Will also get hcv pcr and genotype and crypglobulins.  following.   Electronic  Signatures: Loistine Simas (MD)  (Signed 23-Sep-14 17:59)  Authored: Chief Complaint, VITAL SIGNS/ANCILLARY NOTES, Brief Assessment, Lab Results, Radiology Results, Assessment/Plan   Last Updated: 23-Sep-14 17:59 by Loistine Simas (MD)

## 2014-10-05 NOTE — Consult Note (Signed)
Psychiatry: Came by to see patient today to follow up. He was getting a cath placed so I couldn't talk with him but I did talk awhile with his wife. She is feeling overwhelmed and uupset at her husband's condition. Offered some support and education and encouraged her to talk to primary physicians and treatment team about her concerns. I'll stop by again later to see patient.  Electronic Signatures: Audery Amellapacs, John T (MD)  (Signed on 01-Oct-14 14:44)  Authored  Last Updated: 01-Oct-14 14:44 by Audery Amellapacs, John T (MD)

## 2014-10-05 NOTE — Consult Note (Signed)
Brief Consult Note: Diagnosis: opiate overdose.   Recommend further assessment or treatment.   Comments: Psychiatry: Came by to see patient for consult but he was at dialysis. Spoke with wife who stated she had not been aware of any depression on the patient's part. She said she had concerns in the past that the patient would overuse his pain meds and so for a time she was administering them, but recently her job has kept her away from home more so she wasn't able to supervise it. She denies knowing of any other recent substance abuse. Patient had an overdose of narcotics, unclear if intentional or not. Will follow up tomorrow.  Electronic Signatures: [place] Zaino, Jackquline DenmarkJohn T (MD)  (Signed 24-Sep-14 16:43)  Authored: Brief Consult Note   Last Updated: 24-Sep-14 16:43 by Audery Amellapacs, Dann Galicia T (MD)

## 2014-10-08 ENCOUNTER — Emergency Department
Admit: 2014-10-08 | Disposition: A | Discharge status: Home / Self Care | Payer: Self-pay | Admitting: Emergency Medicine

## 2015-11-18 ENCOUNTER — Emergency Department: Payer: Medicare Other

## 2015-11-18 ENCOUNTER — Emergency Department
Admission: EM | Admit: 2015-11-18 | Discharge: 2015-11-18 | Disposition: A | Discharge status: Home / Self Care | Payer: Medicare Other | Attending: Emergency Medicine | Admitting: Emergency Medicine

## 2015-11-18 DIAGNOSIS — Y999 Unspecified external cause status: Secondary | ICD-10-CM | POA: Diagnosis not present

## 2015-11-18 DIAGNOSIS — Y939 Activity, unspecified: Secondary | ICD-10-CM | POA: Insufficient documentation

## 2015-11-18 DIAGNOSIS — I10 Essential (primary) hypertension: Secondary | ICD-10-CM | POA: Diagnosis not present

## 2015-11-18 DIAGNOSIS — S82202A Unspecified fracture of shaft of left tibia, initial encounter for closed fracture: Secondary | ICD-10-CM

## 2015-11-18 DIAGNOSIS — S82392A Other fracture of lower end of left tibia, initial encounter for closed fracture: Secondary | ICD-10-CM | POA: Insufficient documentation

## 2015-11-18 DIAGNOSIS — W1839XA Other fall on same level, initial encounter: Secondary | ICD-10-CM | POA: Diagnosis not present

## 2015-11-18 DIAGNOSIS — Z87891 Personal history of nicotine dependence: Secondary | ICD-10-CM | POA: Diagnosis not present

## 2015-11-18 DIAGNOSIS — Y929 Unspecified place or not applicable: Secondary | ICD-10-CM | POA: Insufficient documentation

## 2015-11-18 DIAGNOSIS — Z79899 Other long term (current) drug therapy: Secondary | ICD-10-CM | POA: Insufficient documentation

## 2015-11-18 DIAGNOSIS — E119 Type 2 diabetes mellitus without complications: Secondary | ICD-10-CM | POA: Diagnosis not present

## 2015-11-18 DIAGNOSIS — M25572 Pain in left ankle and joints of left foot: Secondary | ICD-10-CM | POA: Diagnosis present

## 2015-11-18 MED ORDER — OXYCODONE HCL 5 MG PO TABS
ORAL_TABLET | ORAL | Status: AC
  Administered 2015-11-18: 5 mg
  Filled 2015-11-18: qty 1

## 2015-11-18 MED ORDER — OXYCODONE HCL 5 MG PO TABS: 5.0000 mg | ORAL_TABLET | Freq: Three times a day (TID) | ORAL | Status: AC | PRN

## 2015-11-18 MED ORDER — OXYCODONE HCL 5 MG PO TABS
5.0000 mg | ORAL_TABLET | Freq: Once | ORAL | Status: DC
  Filled 2015-11-18: qty 1

## 2015-11-18 NOTE — ED Provider Notes (Signed)
[facility] Emergency Department Provider Note ____________________________________________  Time seen: Approximately 8:28 PM  I have reviewed the triage vital signs and the nursing notes.   HISTORY  Chief Complaint Ankle Pain    HPI Joshua Gordon is a 60 y.o. male who presents to the emergency department for evaluation of left foot and ankle pain and swelling. In a mechanical non-syncopal fall 2 days ago and has had increasing pain and swelling of the foot and ankle since that time. He denies previous fracture of the left foot or ankle, but states that he has a "rod from the left hip to below the knee and is unable to bend the left knee." He has been taking ibuprofen for pain.  Past Medical History  Diagnosis Date  . Renal disorder   . Hypertension   . Diabetes mellitus without complication   . Pain management     contract for pain management  . Chronic pain   . Non compliance with medical treatment   . Polysubstance abuse     There are no active problems to display for this patient.   Past Surgical History  Procedure Laterality Date  . Replacement total knee      Current Outpatient Rx  Name  Route  Sig  Dispense  Refill  . ALPRAZolam (XANAX) 1 MG tablet   Oral   Take 1 mg by mouth 2 (two) times daily as needed for anxiety.          . cloNIDine (CATAPRES) 0.2 MG tablet   Oral   Take 0.2 mg by mouth daily.         . cyclobenzaprine (FLEXERIL) 10 MG tablet   Oral   Take 1 tablet (10 mg total) by mouth 3 (three) times daily as needed for muscle spasms.   15 tablet   0   . fentaNYL (DURAGESIC - DOSED MCG/HR) 25 MCG/HR patch   Transdermal   Place 1 patch onto the skin every 3 (three) days.         [name]. oxyCODONE (ROXICODONE) 5 MG immediate release tablet   Oral   Take 1 tablet (5 mg total) by mouth every 8 (eight) hours as needed.   20 tablet   0   . zolpidem (AMBIEN) 10 MG tablet   Oral   Take 10 mg by mouth at bedtime as  needed for sleep.            Allergies Review of patient's allergies indicates no known allergies.  No family history on file.  Social History Social History  Substance Use Topics  . Smoking status: Former Games developermoker  . Smokeless tobacco: Not on file  . Alcohol Use: No    Review of Systems Constitutional: No recent illness. Cardiovascular: Denies chest pain or palpitations. Respiratory: Denies shortness of breath. Musculoskeletal: Pain in Left foot and ankle. Skin: Negative for rash, wound, lesion. Neurological: Negative for focal weakness or numbness.  ____________________________________________   PHYSICAL EXAM:  VITAL SIGNS: ED Triage Vitals  Enc Vitals Group     BP 11/18/15 2016 144/75 mmHg     Pulse Rate 11/18/15 2016 89     Resp 11/18/15 2016 18     Temp 11/18/15 2016 98.6 F (37 C)     Temp Source 11/18/15 2016 Oral     SpO2 --      Weight 11/18/15 2016 203 lb (92.08 kg)     Height 11/18/15 2016 6\' 1"  (1.854 m)  Head Cir --      Peak Flow --      Pain Score 11/18/15 2016 10     Pain Loc --      Pain Edu? --      Excl. in GC? --     Constitutional: Alert and oriented. Well appearing and in no acute distress. Eyes: Conjunctivae are normal. EOMI. Head: Atraumatic. Neck: No stridor.  Respiratory: Normal respiratory effort.   Musculoskeletal: Full range of motion of toes of the left foot. Range of motion of the left ankle very limited secondary to pain and swelling. Swelling extends up to the mid tibia/calf. No tenderness at the proximal fibula or tibia. Neurologic:  Normal speech and language. No gross focal neurologic deficits are appreciated. Speech is normal. No gait instability. Skin:  Skin is warm, dry and intact. Atraumatic. Psychiatric: Mood and affect are normal. Speech and behavior are normal.  ____________________________________________   LABS (all labs ordered are listed, but only abnormal results are displayed)  Labs Reviewed - No data  to display ____________________________________________  RADIOLOGY  Comminuted and intra-articular fracture of the distal tibia as well as a nondisplaced fracture of the medial malleolus of the left ankle.  I, Kem Boroughs, personally viewed and evaluated these images (plain radiographs) as part of my medical decision making, as well as reviewing the written report by the radiologist.  ____________________________________________   PROCEDURES  Procedure(s) performed:   Posterior short leg and ankle stirrup OCL applied by Mellody Dance, ER Tech. Patient neurovascularly intact post-application.  Initial fracture care was provided in the emergency department. Follow-up will be greater than 24 hours.   ____________________________________________   INITIAL IMPRESSION / ASSESSMENT AND PLAN / ED COURSE  Pertinent labs & imaging results that were available during my care of the patient were reviewed by me and considered in my medical decision making (see chart for details).  Case was discussed with Dr. Rosita Kea we will have the patient seen in the clinic on Wednesday. Patient is to remain nonweightbearing. Need for CT scan per radiology recommendation was discussed also and per orthopedics is not indicated at this time. Strict return precautions were discussed with the patient and his wife. Cast care was discussed. ____________________________________________   FINAL CLINICAL IMPRESSION(S) / ED DIAGNOSES  Final diagnoses:  Tibia fracture, left, closed, initial encounter       Chinita Pester, FNP 11/18/15 2315  Emily Filbert, MD 11/20/15 1050

## 2015-11-18 NOTE — ED Notes (Signed)
Pt. Verbalizes understanding of d/c instructions, prescriptions and follow-up care. VS stable. Pt. Wheeled out of the unit with NAD at time of d/c

## 2015-11-18 NOTE — Discharge Instructions (Signed)
°Cast or Splint Care  ° ° °Casts and splints support injured limbs and keep bones from moving while they heal. It is important to care for your cast or splint at home.  °HOME CARE INSTRUCTIONS  °Keep the cast or splint uncovered during the drying period. It can take 24 to 48 hours to dry if it is made of plaster. A fiberglass cast will dry in less than 1 hour.  °Do not rest the cast on anything harder than a pillow for the first 24 hours.  °Do not put weight on your injured limb or apply pressure to the cast until your health care provider gives you permission.  °Keep the cast or splint dry. Wet casts or splints can lose their shape and may not support the limb as well. A wet cast that has lost its shape can also create harmful pressure on your skin when it dries. Also, wet skin can become infected.  °Cover the cast or splint with a plastic bag when bathing or when out in the rain or snow. If the cast is on the trunk of the body, take sponge baths until the cast is removed.  °If your cast does become wet, dry it with a towel or a blow dryer on the cool setting only. °Keep your cast or splint clean. Soiled casts may be wiped with a moistened cloth.  °Do not place any hard or soft foreign objects under your cast or splint, such as cotton, toilet paper, lotion, or powder.  °Do not try to scratch the skin under the cast with any object. The object could get stuck inside the cast. Also, scratching could lead to an infection. If itching is a problem, use a blow dryer on a cool setting to relieve discomfort.  °Do not trim or cut your cast or remove padding from inside of it.  °Exercise all joints next to the injury that are not immobilized by the cast or splint. For example, if you have a long leg cast, exercise the hip joint and toes. If you have an arm cast or splint, exercise the shoulder, elbow, thumb, and fingers.  °Elevate your injured arm or leg on 1 or 2 pillows for the first 1 to 3 days to decrease swelling and  pain. It is best if you can comfortably elevate your cast so it is higher than your heart. °SEEK MEDICAL CARE IF:  °Your cast or splint cracks.  °Your cast or splint is too tight or too loose.  °You have unbearable itching inside the cast.  °Your cast becomes wet or develops a soft spot or area.  °You have a bad smell coming from inside your cast.  °You get an object stuck under your cast.  °Your skin around the cast becomes red or raw.  °You have new pain or worsening pain after the cast has been applied. °SEEK IMMEDIATE MEDICAL CARE IF:  °You have fluid leaking through the cast.  °You are unable to move your fingers or toes.  °You have discolored (blue or white), cool, painful, or very swollen fingers or toes beyond the cast.  °You have tingling or numbness around the injured area.  °You have severe pain or pressure under the cast.  °You have any difficulty with your breathing or have shortness of breath.  °You have chest pain. °This information is not intended to replace advice given to you by your health care provider. Make sure you discuss any questions you have with your health care provider.  °  Document Released: 05/29/2000 Document Revised: 03/22/2013 Document Reviewed: 12/08/2012  °Elsevier Interactive Patient Education ©2016 [employer]  ° °

## 2015-11-18 NOTE — ED Notes (Signed)
Pt to triage via w/c with no distress noted; pt reports fell couple days ago and c/o persistent left ankle pain

## 2015-11-18 NOTE — ED Notes (Signed)
Patient transported to X-ray 

## 2016-01-03 ENCOUNTER — Emergency Department
Admission: EM | Admit: 2016-01-03 | Discharge: 2016-01-03 | Disposition: A | Discharge status: Home / Self Care | Payer: Medicare Other | Attending: Emergency Medicine | Admitting: Emergency Medicine

## 2016-01-03 ENCOUNTER — Encounter: Payer: Self-pay | Admitting: Emergency Medicine

## 2016-01-03 ENCOUNTER — Emergency Department: Payer: Medicare Other

## 2016-01-03 DIAGNOSIS — S82302A Unspecified fracture of lower end of left tibia, initial encounter for closed fracture: Secondary | ICD-10-CM | POA: Diagnosis not present

## 2016-01-03 DIAGNOSIS — S8252XA Displaced fracture of medial malleolus of left tibia, initial encounter for closed fracture: Secondary | ICD-10-CM

## 2016-01-03 DIAGNOSIS — W19XXXA Unspecified fall, initial encounter: Secondary | ICD-10-CM | POA: Insufficient documentation

## 2016-01-03 DIAGNOSIS — Z79899 Other long term (current) drug therapy: Secondary | ICD-10-CM | POA: Diagnosis not present

## 2016-01-03 DIAGNOSIS — Y999 Unspecified external cause status: Secondary | ICD-10-CM | POA: Insufficient documentation

## 2016-01-03 DIAGNOSIS — S82832A Other fracture of upper and lower end of left fibula, initial encounter for closed fracture: Secondary | ICD-10-CM

## 2016-01-03 DIAGNOSIS — Y92009 Unspecified place in unspecified non-institutional (private) residence as the place of occurrence of the external cause: Secondary | ICD-10-CM | POA: Diagnosis not present

## 2016-01-03 DIAGNOSIS — E119 Type 2 diabetes mellitus without complications: Secondary | ICD-10-CM | POA: Diagnosis not present

## 2016-01-03 DIAGNOSIS — I10 Essential (primary) hypertension: Secondary | ICD-10-CM | POA: Insufficient documentation

## 2016-01-03 DIAGNOSIS — M79605 Pain in left leg: Secondary | ICD-10-CM | POA: Diagnosis present

## 2016-01-03 DIAGNOSIS — F17210 Nicotine dependence, cigarettes, uncomplicated: Secondary | ICD-10-CM | POA: Insufficient documentation

## 2016-01-03 DIAGNOSIS — Y939 Activity, unspecified: Secondary | ICD-10-CM | POA: Insufficient documentation

## 2016-01-03 NOTE — ED Notes (Signed)
See triage note  Having cont'd pain to left leg for the past 3 weeks   Also states he has fallen a few times   Not sure if he e-injured it

## 2016-01-03 NOTE — Discharge Instructions (Signed)
Cast or Splint Care Casts and splints support injured limbs and keep bones from moving while they heal.  HOME CARE  Keep the cast or splint uncovered during the drying period.  A plaster cast can take 24 to 48 hours to dry.  A fiberglass cast will dry in less than 1 hour.  Do not rest the cast on anything harder than a pillow for 24 hours.  Do not put weight on your injured limb. Do not put pressure on the cast. Wait for your doctor's approval.  Keep the cast or splint dry.  Cover the cast or splint with a plastic bag during baths or wet weather.  If you have a cast over your chest and belly (trunk), take sponge baths until the cast is taken off.  If your cast gets wet, dry it with a towel or blow dryer. Use the cool setting on the blow dryer.  Keep your cast or splint clean. Wash a dirty cast with a damp cloth.  Do not put any objects under your cast or splint.  Do not scratch the skin under the cast with an object. If itching is a problem, use a blow dryer on a cool setting over the itchy area.  Do not trim or cut your cast.  Do not take out the padding from inside your cast.  Exercise your joints near the cast as told by your doctor.  Raise (elevate) your injured limb on 1 or 2 pillows for the first 1 to 3 days. GET HELP IF:  Your cast or splint cracks.  Your cast or splint is too tight or too loose.  You itch badly under the cast.  Your cast gets wet or has a soft spot.  You have a bad smell coming from the cast.  You get an object stuck under the cast.  Your skin around the cast becomes red or sore.  You have new or more pain after the cast is put on. GET HELP RIGHT AWAY IF:  You have fluid leaking through the cast.  You cannot move your fingers or toes.  Your fingers or toes turn blue or white or are cool, painful, or puffy (swollen).  You have tingling or lose feeling (numbness) around the injured area.  You have bad pain or pressure under the  cast.  You have trouble breathing or have shortness of breath.  You have chest pain.   This information is not intended to replace advice given to you by your health care provider. Make sure you discuss any questions you have with your health care provider.   Document Released: 10/01/2010 Document Revised: 02/01/2013 Document Reviewed: 12/08/2012 Elsevier Interactive Patient Education 2016 [employer]  Tibial Fracture, Adult A tibial fracture is a break in your tibia bone. The tibia is the large shin bone in your lower leg. The bone will be held in place with a cast or splint until it is healed. HOME CARE  If you have a cast:  Do not scratch under the cast.  Check the skin around the cast every day. You may put lotion on any red or sore areas.  Keep your cast dry and clean.  If you have a splint:  Wear the splint as told by your doctor.  Loosen the elastic around the splint if your toes get numb, tingle, or turn cold or blue.  Do not put pressure on the cast or splint until it is hard.  Do not put the cast or splint  in water. Cover it with a plastic bag when bathing.  Use crutches as told by your doctor.  Take medicines only as told by your doctor.  Keep all follow-up visits as told by your doctor. This is important. GET HELP IF:  Your pain gets worse or is not controlled with medicine.  You have increased puffiness (swelling) or redness in your foot.  You start to lose feeling in your foot or toes. GET HELP RIGHT AWAY IF:  Your foot or toes get cold or turn blue.  You have bad pain in your leg, especially if it gets worse when you move your toes. MAKE SURE YOU:  Understand these instructions.  Will watch your condition.  Will get help right away if you are not doing well or get worse.   This information is not intended to replace advice given to you by your health care provider. Make sure you discuss any questions you have with your health care  provider.   Document Released: 07/04/2010 Document Revised: 10/16/2014 Document Reviewed: 07/26/2013 Elsevier Interactive Patient Education 2016 [employer]   Fibular Ankle Fracture Treated With or Without Immobilization, Adult  A fibular fracture at your ankle is a break (fracture) bone in the smallest of the two bones in your lower leg, located on the outside of your leg (fibula) close to the area at your ankle joint.  CAUSES  Rolling your ankle.  Twisting your ankle.  Extreme flexing or extending of your foot.  Severe force on your ankle as when falling from a distance. RISK FACTORS  Jumping activities.  Participation in sports.  Osteoporosis.  Advanced age.  Previous ankle injuries. SIGNS AND SYMPTOMS  Pain.  Swelling.  Inability to put weight on injured ankle.  Bruising.  Bone deformities at site of injury. DIAGNOSIS  This fracture is diagnosed with the help of an X-ray exam.  TREATMENT  If the fractured bone did not move out of place it usually will heal without problems and does casting or splinting. If immobilization is needed for comfort or the fractured bone moved out of place and will not heal properly with immobilization, a cast or splint will be used.  HOME CARE INSTRUCTIONS  Apply ice to the area of injury:  Put ice in a plastic bag.  Place a towel between your skin and the bag.  Leave the ice on for 20 minutes, 2-3 times a day. Use crutches as directed. Resume walking without crutches as directed by your health care provider.  Only take over-the-counter or prescription medicines for pain, discomfort, or fever as directed by your health care provider.  If you have a removable splint or boot, do not remove the boot unless directed by your health care provider. SEEK MEDICAL CARE IF:  You have continued pain or more swelling  The medications do not control the pain. SEEK IMMEDIATE MEDICAL CARE IF:  You develop severe pain in the leg or foot.  Your skin or nails  below the injury turn blue or grey or feel cold or numb. MAKE SURE YOU:  Understand these instructions.  Will watch your condition.  Will get help right away if you are not doing well or get worse. This information is not intended to replace advice given to you by your health care provider. Make sure you discuss any questions you have with your health care provider.  Document Released: 06/01/2005 Document Revised: 06/22/2014 Document Reviewed: 01/11/2013  Elsevier Interactive Patient Education [employer]

## 2016-01-03 NOTE — ED Provider Notes (Signed)
[facility] Emergency Department Provider Note  ____________________________________________  Time seen: Approximately 3:31 PM  I have reviewed the triage vital signs and the nursing notes.   HISTORY  Chief Complaint Leg Pain    HPI Joshua Gordon is a 60 y.o. male , NAD, presents to the emergency department with complaint of increasing lower left leg pain 3 weeks. Patient states he was seen in this emergency department in early June and noted to have left lower leg fracture. Patient was placed in a temporary splint and followed up with Dr. Rosita Kea in orthopedics who applied a cast. Patient notes that his pain was improving over the first 3 weeks being in a cast but now pain is increased and his feet and lower leg feels swollen beneath the cast. States he has not been using crutches at home to angulate. Has been walking and doing chores about his house per his usual. Denies any falls or other traumas to the leg over the last 6 weeks. Patient notes that he is diabetic and concerned about the condition of his lower leg and feet. Also notes that the cast has been digging into the upper part of his lower leg causing skin sores. Patient is currently on Suboxone for history of polysubstance abuse.   Past Medical History  Diagnosis Date  . Renal disorder   . Hypertension   . Diabetes mellitus without complication (HCC)   . Pain management     contract for pain management  . Chronic pain   . Non compliance with medical treatment   . Polysubstance abuse     There are no active problems to display for this patient.   Past Surgical History  Procedure Laterality Date  . Replacement total knee      Current Outpatient Rx  Name  Route  Sig  Dispense  Refill  . ALPRAZolam (XANAX) 1 MG tablet   Oral   Take 1 mg by mouth 2 (two) times daily as needed for anxiety.          . cloNIDine (CATAPRES) 0.2 MG tablet   Oral   Take 0.2 mg by mouth daily.         .  cyclobenzaprine (FLEXERIL) 10 MG tablet   Oral   Take 1 tablet (10 mg total) by mouth 3 (three) times daily as needed for muscle spasms.   15 tablet   0   . fentaNYL (DURAGESIC - DOSED MCG/HR) 25 MCG/HR patch   Transdermal   Place 1 patch onto the skin every 3 (three) days.         [name] oxyCODONE (ROXICODONE) 5 MG immediate release tablet   Oral   Take 1 tablet (5 mg total) by mouth every 8 (eight) hours as needed.   20 tablet   0   . zolpidem (AMBIEN) 10 MG tablet   Oral   Take 10 mg by mouth at bedtime as needed for sleep.            Allergies Review of patient's allergies indicates no known allergies.  No family history on file.  Social History Social History  Substance Use Topics  . Smoking status: Current Every Day Smoker -- 0.50 packs/day    Types: Cigarettes  . Smokeless tobacco: None  . Alcohol Use: No     Review of Systems  Constitutional: No fever/chills Eyes: No visual changes.  Cardiovascular: No chest pain. Respiratory:  No shortness of breath. No wheezing.  Gastrointestinal: No abdominal pain.  No nausea, vomiting.   Musculoskeletal: Positive left lower leg pain. Skin: Positive abrasions to left lower leg. Positive swelling left lower leg and foot. Negative for rash, redness, warmth. Neurological: Negative for headaches, focal weakness or numbness. No tingling. 10-point ROS otherwise negative.  ____________________________________________   PHYSICAL EXAM:  VITAL SIGNS: ED Triage Vitals  Enc Vitals Group     BP 01/03/16 1452 162/91 mmHg     Pulse Rate 01/03/16 1452 65     Resp 01/03/16 1452 18     Temp 01/03/16 1452 98.1 F (36.7 C)     Temp Source 01/03/16 1452 Oral     SpO2 01/03/16 1452 98 %     Weight 01/03/16 1452 210 lb (95.255 kg)     Height 01/03/16 1452 6\' 1"  (1.854 m)     Head Cir --      Peak Flow --      Pain Score 01/03/16 1454 10     Pain Loc --      Pain Edu? --      Excl. in GC? --      Constitutional: Alert and  oriented. Well appearing and in no acute distress. Eyes: Conjunctivae are normal.  Head: Atraumatic. Cardiovascular: Normal rate, regular rhythm. Normal S1 and S2.  Good peripheral circulation with 2+ pulses in the left lower extremity (after cast removal). Capillary refill is brisk in all digits of the left foot. Respiratory: Normal respiratory effort without tachypnea or retractions.  Musculoskeletal: After the cast was removed it was noted that there was moderate swelling about the left ankle with indentions of previous gauze in the skin. No open wounds or lesions. Compartments are soft. FROM of left toes without pain.  Neurologic:  Normal speech and language. No gross focal neurologic deficits are appreciated.  Skin:  Serous oozing or weeping noted about the intertriginous areas of the toes of the left foot. Superficial, non-oozing/bleeding/bleeding abrasion noted to the upper left lower leg at the site of the superior portion of the cast that is in place. No rash noted. Psychiatric: Mood and affect are normal. Speech and behavior are normal. Patient exhibits appropriate insight and but poor judgement in regards to personal care.   ____________________________________________   LABS  None ____________________________________________  EKG  None ____________________________________________  RADIOLOGY I have personally viewed and evaluated these images (plain radiographs) as part of my medical decision making, as well as reviewing the written report by the radiologist.  Dg Ankle Complete Left  01/03/2016  CLINICAL DATA:  Multiple falls, history of recent fracture in distal tibia medial malleolus EXAM: LEFT ANKLE COMPLETE - 3+ VIEW COMPARISON:  11/18/2015 FINDINGS: Three views of the left ankle submitted. Again noted comminuted oblique intra-articular fracture of distal tibia. Study is limited by casting material artifact. Mild increased displacement of distal tibia medial malleolus  fracture. There is new mild angulated fracture distal shaft of left fibula. Also mild angulation in distal shaft of left tibia. IMPRESSION: Again noted comminuted oblique intra-articular fracture of distal tibia. Study is limited by casting material artifact. Mild increased displacement of distal tibia medial malleolus fracture. There is new mild angulated fracture distal shaft of left fibula. Mild angulation distal shaft of left tibia. These results were called by telephone at the time of interpretation on 01/03/2016 at 4:03 pm to Dr. Tye Savoy , who verbally acknowledged these results. Electronically Signed   By: Natasha Mead M.D.   On: 01/03/2016 16:03    ____________________________________________    PROCEDURES  Procedure(s) performed: None    Medications - No data to display  ----------------------------------------- 4:08PM on 01/03/2016 ----------------------------------------- I spoke with Dr. Rosita KeaMenz who is the treating orthopedic physician for this patient and he recommends no further intervention is needed at this time. Patient may follow-up with Dr. Rosita KeaMenz next week for cast replacement. Apparently the patient had an appointment approximately 2 hours ago with Dr. Rosita KeaMenz in his clinic but no showed.   ----------------------------------------- 4:18 PM on 01/03/2016 -----------------------------------------  Patient was evaluated by Dr. Weyman Pedroobert Kanter in regards to the increased pain and discomfort the patient was having in his current cast. We will remove the current casting on the patient's lower leg and replace with a temporary Ortho-Glass splint. Patient is significantly pleased with this outcome and understands that he must keep close follow-up with Dr. Rosita KeaMenz and must make an appointment for early next week for recasting. Patient also made aware and verbalizes understanding that he must use crutches and not bear weight on any splint or cast is placed about the left lower  extremity. ____________________________________________   INITIAL IMPRESSION / ASSESSMENT AND PLAN / ED COURSE  Pertinent imaging results that were available during my care of the patient were reviewed by me and considered in my medical decision making (see chart for details).  Patient's diagnosis is consistent with fracture distal end of tibia with fibula and fractured left medial malleolus. Short leg sugar tong and posterior Ortho-Glass splint was applied. Patient was given crutches and was strongly urged to use them and not weight-bear any longer on the left lower leg. Patient will be discharged home with instructions for home care to include over-the-counter Tylenol or ibuprofen as needed for pain. Patient is to follow up with Dr. Rosita KeaMenz in orthopedics next week for cast placement. Patient is given ED precautions to return to the ED for any worsening or new symptoms. Patient and his wife at the bedside both verbalize understanding of instructions per above. Patient declined any treatment of oozing or weeping noted in the intertriginous areas of his feet stating that it was due to his wife applying an ointment although his wife states she did not do so.    ____________________________________________  FINAL CLINICAL IMPRESSION(S) / ED DIAGNOSES  Final diagnoses:  Fracture of distal end of tibia with fibula, left, closed, initial encounter  Fractured medial malleolus, left, closed, initial encounter      NEW MEDICATIONS STARTED DURING THIS VISIT:  New Prescriptions   No medications on file         Hope PigeonJami L Britny Riel, PA-C 01/03/16 1742   Jene Everyobert Kinner, MD 01/06/16 2102

## 2016-01-03 NOTE — ED Notes (Signed)
Patient presents to the ED with left leg pain.  Patient states he broke his femur about 6 weeks ago and had a cast placed to his left leg.  Patient states for the past 3 weeks he has had increasing pain to area and states he fell multiple times while he was trying to do chores around his house.  Patient states prior to this he felt that his pain was improving and his mobility was improving but states it is now decreasing.  Patient states, "I think I broke it again."

## 2016-01-22 ENCOUNTER — Other Ambulatory Visit: Payer: Medicare Other

## 2016-01-23 ENCOUNTER — Inpatient Hospital Stay: Payer: Medicare Other | Admitting: Anesthesiology

## 2016-01-23 ENCOUNTER — Encounter
Admission: RE | Disposition: A | Discharge status: Home / Self Care | Payer: Self-pay | Source: Ambulatory Visit | Attending: Orthopedic Surgery

## 2016-01-23 ENCOUNTER — Encounter: Payer: Self-pay | Admitting: *Deleted

## 2016-01-23 ENCOUNTER — Ambulatory Visit
Admission: RE | Admit: 2016-01-23 | Discharge: 2016-01-23 | Disposition: A | Discharge status: Home / Self Care | Payer: Medicare Other | Source: Ambulatory Visit | Attending: Orthopedic Surgery | Admitting: Orthopedic Surgery

## 2016-01-23 DIAGNOSIS — Z538 Procedure and treatment not carried out for other reasons: Secondary | ICD-10-CM | POA: Diagnosis not present

## 2016-01-23 DIAGNOSIS — Z7984 Long term (current) use of oral hypoglycemic drugs: Secondary | ICD-10-CM | POA: Diagnosis not present

## 2016-01-23 DIAGNOSIS — Z79899 Other long term (current) drug therapy: Secondary | ICD-10-CM | POA: Insufficient documentation

## 2016-01-23 DIAGNOSIS — F1410 Cocaine abuse, uncomplicated: Secondary | ICD-10-CM | POA: Insufficient documentation

## 2016-01-23 DIAGNOSIS — F17200 Nicotine dependence, unspecified, uncomplicated: Secondary | ICD-10-CM | POA: Diagnosis not present

## 2016-01-23 DIAGNOSIS — X58XXXA Exposure to other specified factors, initial encounter: Secondary | ICD-10-CM | POA: Diagnosis not present

## 2016-01-23 DIAGNOSIS — E119 Type 2 diabetes mellitus without complications: Secondary | ICD-10-CM | POA: Diagnosis not present

## 2016-01-23 DIAGNOSIS — Z419 Encounter for procedure for purposes other than remedying health state, unspecified: Secondary | ICD-10-CM

## 2016-01-23 DIAGNOSIS — S82202A Unspecified fracture of shaft of left tibia, initial encounter for closed fracture: Secondary | ICD-10-CM | POA: Diagnosis not present

## 2016-01-23 LAB — URINE DRUG SCREEN, QUALITATIVE (ARMC ONLY)
AMPHETAMINES, UR SCREEN: NOT DETECTED
BENZODIAZEPINE, UR SCRN: NOT DETECTED
Barbiturates, Ur Screen: NOT DETECTED
COCAINE METABOLITE, UR ~~LOC~~: POSITIVE — AB
Cannabinoid 50 Ng, Ur ~~LOC~~: NOT DETECTED
MDMA (ECSTASY) UR SCREEN: NOT DETECTED
METHADONE SCREEN, URINE: NOT DETECTED
OPIATE, UR SCREEN: NOT DETECTED
PHENCYCLIDINE (PCP) UR S: NOT DETECTED
Tricyclic, Ur Screen: NOT DETECTED

## 2016-01-23 LAB — GLUCOSE, CAPILLARY: Glucose-Capillary: 167 mg/dL — ABNORMAL HIGH (ref 65–99)

## 2016-01-23 SURGERY — OPEN REDUCTION INTERNAL FIXATION (ORIF) TIBIA FRACTURE
Anesthesia: General | Laterality: Left

## 2016-01-23 MED ORDER — CEFAZOLIN SODIUM-DEXTROSE 2-4 GM/100ML-% IV SOLN: 2.0000 g | Freq: Once | INTRAVENOUS | Status: DC

## 2016-01-23 MED ORDER — FAMOTIDINE 20 MG PO TABS
ORAL_TABLET | ORAL | Status: AC
  Administered 2016-01-23: 20 mg via ORAL
  Filled 2016-01-23: qty 1

## 2016-01-23 MED ORDER — SODIUM CHLORIDE 0.9 % IV SOLN: INTRAVENOUS | Status: DC

## 2016-01-23 MED ORDER — FAMOTIDINE 20 MG PO TABS
20.0000 mg | ORAL_TABLET | Freq: Once | ORAL | Status: AC
  Administered 2016-01-23: 20 mg via ORAL

## 2016-01-23 MED ORDER — HYDROMORPHONE HCL 1 MG/ML IJ SOLN
INTRAMUSCULAR | Status: AC
  Filled 2016-01-23: qty 1

## 2016-01-23 MED ORDER — FENTANYL CITRATE (PF) 100 MCG/2ML IJ SOLN
INTRAMUSCULAR | Status: AC
  Filled 2016-01-23: qty 6

## 2016-01-23 MED ORDER — CEFAZOLIN SODIUM-DEXTROSE 2-4 GM/100ML-% IV SOLN
INTRAVENOUS | Status: AC
  Filled 2016-01-23: qty 100

## 2016-01-23 SURGICAL SUPPLY — 35 items
CANISTER SUCT 1200ML W/VALVE (MISCELLANEOUS) IMPLANT
CATH TRAY 16F METER LATEX (MISCELLANEOUS) IMPLANT
CHLORAPREP W/TINT 26ML (MISCELLANEOUS) IMPLANT
CUFF TOURN 24 STER (MISCELLANEOUS) IMPLANT
CUFF TOURN 30 STER DUAL PORT (MISCELLANEOUS) IMPLANT
DRAPE C-ARM XRAY 36X54 (DRAPES) IMPLANT
DRAPE C-ARMOR (DRAPES) IMPLANT
DRAPE SHEET LG 3/4 BI-LAMINATE (DRAPES) IMPLANT
DRSG EMULSION OIL 3X8 NADH (GAUZE/BANDAGES/DRESSINGS) IMPLANT
ELECT CAUTERY BLADE 6.4 (BLADE) IMPLANT
ELECT REM PT RETURN 9FT ADLT (ELECTROSURGICAL)
ELECTRODE REM PT RTRN 9FT ADLT (ELECTROSURGICAL) IMPLANT
GAUZE SPONGE 4X4 12PLY STRL (GAUZE/BANDAGES/DRESSINGS) IMPLANT
GLOVE BIO SURGEON STRL SZ7.5 (GLOVE) IMPLANT
GLOVE BIOGEL PI IND STRL 9 (GLOVE) IMPLANT
GLOVE BIOGEL PI INDICATOR 9 (GLOVE)
GLOVE SURG ORTHO 9.0 STRL STRW (GLOVE) IMPLANT
GOWN SRG 2XL LVL 4 RGLN SLV (GOWNS) IMPLANT
GOWN STRL NON-REIN 2XL LVL4 (GOWNS)
GOWN STRL REUS W/ TWL LRG LVL3 (GOWN DISPOSABLE) IMPLANT
GOWN STRL REUS W/TWL LRG LVL3 (GOWN DISPOSABLE)
HEMOVAC 400CC 10FR (MISCELLANEOUS) IMPLANT
KIT RM TURNOVER STRD PROC AR (KITS) IMPLANT
NEEDLE FILTER BLUNT 18X 1/2SAF (NEEDLE)
NEEDLE FILTER BLUNT 18X1 1/2 (NEEDLE) IMPLANT
NS IRRIG 1000ML POUR BTL (IV SOLUTION) IMPLANT
PACK TOTAL KNEE (MISCELLANEOUS) IMPLANT
PAD PREP 24X41 OB/GYN DISP (PERSONAL CARE ITEMS) IMPLANT
STAPLER SKIN PROX 35W (STAPLE) IMPLANT
STRAP SAFETY BODY (MISCELLANEOUS) IMPLANT
SUT VIC AB 2-0 SH 27 (SUTURE)
SUT VIC AB 2-0 SH 27XBRD (SUTURE) IMPLANT
SUT VICRYL 1-0 27IN ABS (SUTURE)
SUTURE VICRYL 1-0 27IN ABS (SUTURE) IMPLANT
SYR 5ML LL (SYRINGE) IMPLANT

## 2016-01-23 NOTE — H&P (Signed)
Reviewed paper H+P, will be scanned into chart. No changes noted.  

## 2016-01-23 NOTE — OR Nursing (Signed)
UDS positive for Cocaine. Dr Priscella MannPenwarden aware, dr Rosita KeaMenz Aware. Case cancelled.  pt and wife told  per Dr Rosita KeaMenz  of risks for surgery if cocaine in system. Inst. Pt to  Refrain from using illicit drugs and surgery could be rescheduled to next Thursday.  IV d/c and pt dc home

## 2016-01-23 NOTE — Anesthesia Preprocedure Evaluation (Addendum)
Anesthesia Evaluation  Patient identified by MRN, date of birth, ID band Patient awake    Reviewed: Allergy & Precautions, NPO status , Patient's Chart, lab work & pertinent test results  History of Anesthesia Complications Negative for: history of anesthetic complications  Airway Mallampati: IV  TM Distance: >3 FB Neck ROM: Full    Dental  (+) Lower Dentures, Upper Dentures   Pulmonary neg COPD, Current Smoker,    breath sounds clear to auscultation- rhonchi (-) wheezing      Cardiovascular hypertension, (-) CAD and (-) Past MI  Rhythm:Regular Rate:Normal - Systolic murmurs and - Diastolic murmurs    Neuro/Psych negative psych ROS   GI/Hepatic negative GI ROS, (+)     substance abuse  cocaine use,   Endo/Other  diabetes, Type 2, Oral Hypoglycemic Agents  Renal/GU CRFRenal disease     Musculoskeletal Tibia fracture    Abdominal Normal abdominal exam  (+)   Peds  Hematology negative hematology ROS (+)   Anesthesia Other Findings Past Medical History: No date: Chronic pain No date: Diabetes mellitus without complication (HCC) No date: Hypertension No date: Non compliance with medical treatment No date: Pain management     Comment: contract for pain management No date: Polysubstance abuse No date: Renal disorder   Reproductive/Obstetrics                            Anesthesia Physical Anesthesia Plan  ASA: III  Anesthesia Plan: General   Post-op Pain Management:    Induction: Intravenous  Airway Management Planned: LMA  Additional Equipment:   Intra-op Plan:   Post-operative Plan:   Informed Consent: I have reviewed the patients History and Physical, chart, labs and discussed the procedure including the risks, benefits and alternatives for the proposed anesthesia with the patient or authorized representative who has indicated his/her understanding and acceptance.   Dental  advisory given  Plan Discussed with: CRNA and Anesthesiologist  Anesthesia Plan Comments:         Anesthesia Quick Evaluation

## 2016-01-28 ENCOUNTER — Other Ambulatory Visit: Payer: Medicare Other

## 2016-01-29 ENCOUNTER — Other Ambulatory Visit: Payer: Medicare Other

## 2016-01-30 ENCOUNTER — Inpatient Hospital Stay: Admission: RE | Admit: 2016-01-30 | Payer: Medicare Other | Source: Ambulatory Visit | Admitting: Orthopedic Surgery

## 2016-01-30 ENCOUNTER — Encounter: Admission: RE | Payer: Self-pay | Source: Ambulatory Visit

## 2016-01-30 SURGERY — OPEN REDUCTION INTERNAL FIXATION (ORIF) TIBIA FRACTURE
Anesthesia: Choice | Laterality: Left

## 2017-03-23 ENCOUNTER — Emergency Department: Payer: Medicare Other

## 2017-03-23 ENCOUNTER — Encounter: Payer: Self-pay | Admitting: Emergency Medicine

## 2017-03-23 ENCOUNTER — Emergency Department
Admission: EM | Admit: 2017-03-23 | Discharge: 2017-03-23 | Disposition: A | Discharge status: Home / Self Care | Payer: Medicare Other | Attending: Emergency Medicine | Admitting: Emergency Medicine

## 2017-03-23 DIAGNOSIS — M25572 Pain in left ankle and joints of left foot: Secondary | ICD-10-CM | POA: Diagnosis not present

## 2017-03-23 DIAGNOSIS — I10 Essential (primary) hypertension: Secondary | ICD-10-CM | POA: Diagnosis not present

## 2017-03-23 DIAGNOSIS — M545 Low back pain, unspecified: Secondary | ICD-10-CM

## 2017-03-23 DIAGNOSIS — W19XXXA Unspecified fall, initial encounter: Secondary | ICD-10-CM

## 2017-03-23 DIAGNOSIS — M79605 Pain in left leg: Secondary | ICD-10-CM

## 2017-03-23 DIAGNOSIS — E876 Hypokalemia: Secondary | ICD-10-CM | POA: Diagnosis not present

## 2017-03-23 DIAGNOSIS — Z7984 Long term (current) use of oral hypoglycemic drugs: Secondary | ICD-10-CM | POA: Insufficient documentation

## 2017-03-23 DIAGNOSIS — F17210 Nicotine dependence, cigarettes, uncomplicated: Secondary | ICD-10-CM | POA: Insufficient documentation

## 2017-03-23 DIAGNOSIS — Z79899 Other long term (current) drug therapy: Secondary | ICD-10-CM | POA: Diagnosis not present

## 2017-03-23 DIAGNOSIS — E119 Type 2 diabetes mellitus without complications: Secondary | ICD-10-CM | POA: Insufficient documentation

## 2017-03-23 LAB — BASIC METABOLIC PANEL
ANION GAP: 9 (ref 5–15)
BUN: 8 mg/dL (ref 6–20)
CALCIUM: 8.8 mg/dL — AB (ref 8.9–10.3)
CHLORIDE: 100 mmol/L — AB (ref 101–111)
CO2: 29 mmol/L (ref 22–32)
CREATININE: 0.98 mg/dL (ref 0.61–1.24)
GFR calc Af Amer: >60 mL/min (ref >60)
GLUCOSE: 139 mg/dL — AB (ref 65–99)
POTASSIUM: 2.9 mmol/L — AB (ref 3.5–5.1)
SODIUM: 138 mmol/L (ref 135–145)

## 2017-03-23 LAB — CBC WITH DIFFERENTIAL/PLATELET
BASOS ABS: 0 10*3/uL (ref 0–0.1)
Basophils Relative: 0 %
EOS ABS: 0.2 10*3/uL (ref 0–0.7)
Eosinophils Relative: 4 %
HCT: 39.1 % — ABNORMAL LOW (ref 40.0–52.0)
HEMOGLOBIN: 13.2 g/dL (ref 13.0–18.0)
LYMPHS ABS: 2.2 10*3/uL (ref 1.0–3.6)
LYMPHS PCT: 39 %
MCH: 27.6 pg (ref 26.0–34.0)
MCHC: 33.9 g/dL (ref 32.0–36.0)
MCV: 81.4 fL (ref 80.0–100.0)
Monocytes Absolute: 0.6 10*3/uL (ref 0.2–1.0)
Monocytes Relative: 10 %
NEUTROS PCT: 47 %
Neutro Abs: 2.7 10*3/uL (ref 1.4–6.5)
Platelets: 141 10*3/uL — ABNORMAL LOW (ref 150–440)
RBC: 4.8 MIL/uL (ref 4.40–5.90)
RDW: 14.3 % (ref 11.5–14.5)
WBC: 5.7 10*3/uL (ref 3.8–10.6)

## 2017-03-23 MED ORDER — POTASSIUM CHLORIDE CRYS ER 20 MEQ PO TBCR
40.0000 meq | EXTENDED_RELEASE_TABLET | Freq: Once | ORAL | Status: AC
  Administered 2017-03-23: 40 meq via ORAL
  Filled 2017-03-23: qty 2

## 2017-03-23 MED ORDER — POTASSIUM CHLORIDE ER 10 MEQ PO TBCR: 20.0000 meq | EXTENDED_RELEASE_TABLET | Freq: Every day | ORAL | 0 refills | Status: DC

## 2017-03-23 NOTE — ED Notes (Signed)
Pt refuses to sign for discharge, pt is upset that he is being discharged, this RN explained to pt that there is no medical indication for [place] to hospitalize him at this time.  Pt extremely disgruntled upon discharge, states, "This hospital is the worst and its not the hospital, its the people that work here."  Pt placed in wheelchair and taken to lobby via this RN, pt using phone in lobby to call his wife to be picked up.  First nurse made aware.

## 2017-03-23 NOTE — ED Triage Notes (Signed)
Pt in via [facility], pt reports he fell through his bathroom floor this morning, complains of bilateral ankle pain and back pain.  Pt with obvious deformity to left ankle, states that is old x 7-8 months.  Pt appears drowsy, vitals WDL.

## 2017-03-23 NOTE — ED Provider Notes (Addendum)
[facility] Emergency Department Provider Note  ____________________________________________   I have reviewed the triage vital signs and the nursing notes.   HISTORY  Chief Complaint Fall    HPI [name] is a 61 y.o. male with a history of chronic pain, substance abuse, diabetes mellitus hypertension compliance, who has a known fracture of his left ankle from July 2017, did not CID I" with orthopedic surgery and therefore a headache currently. He walks therefore on a vent and twisted left ankle, pain. This is how he is getting around for well over a year. He was in his house and he fell through the floor which was apparently rotted out around the bathroom, he landed standing up. He has pain to his entire body. However, he did not hit his head, and he was upright in the entire finger. He presents with complaint, when vastus was mild pain to his low back as well as chronic pain in his deformed left ankle.Marland KitchenEMS reportsdrug peripheral area throughout the house.     Past Medical History:  Diagnosis Date  . Chronic pain   . Diabetes mellitus without complication (HCC)   . Hypertension   . Non compliance with medical treatment   . Pain management    contract for pain management  . Polysubstance abuse (HCC)   . Renal disorder     There are no active problems to display for this patient.   Past Surgical History:  Procedure Laterality Date  . FEMUR FRACTURE SURGERY Left   . KNEE SURGERY Left 1991   5 times  . REPLACEMENT TOTAL KNEE Right    twice    Prior to Admission medications   Medication Sig Start Date End Date Taking? Authorizing Provider  ALPRAZolam Prudy Feeler) 1 MG tablet Take 1 mg by mouth 2 (two) times daily.  03/24/13   [provider]  amphetamine-dextroamphetamine (ADDERALL XR) 30 MG 24 hr capsule Take 30 mg by mouth daily. 12/23/15   [provider]  citalopram (CELEXA) 40 MG tablet Take 40 mg by mouth daily. 12/21/15    [provider]  cloNIDine (CATAPRES - DOSED IN MG/24 HR) 0.3 mg/24hr patch Place 0.3 mg onto the skin once a week. 01/18/16   [provider]  gabapentin (NEURONTIN) 600 MG tablet Take 600 mg by mouth 4 (four) times daily. 12/21/15   [provider]  ibuprofen (ADVIL,MOTRIN) 800 MG tablet Take 800 mg by mouth 3 (three) times daily. 12/21/15   [provider]  JANUVIA 100 MG tablet Take 100 mg by mouth daily. 12/21/15   [provider]  LYRICA 150 MG capsule Take 150 mg by mouth 2 (two) times daily. 12/17/15   [provider]  metFORMIN (GLUCOPHAGE) 1000 MG tablet Take 1,000 mg by mouth 2 (two) times daily. 12/21/15   [provider]  SUBOXONE 8-2 MG FILM Place 2 mg under the tongue 3 (three) times daily. 01/18/16   [provider]  zolpidem (AMBIEN) 10 MG tablet Take 10 mg by mouth at bedtime as needed for sleep.  04/04/13   [provider]    Allergies Patient has no known allergies.  No family history on file.  Social History Social History  Substance Use Topics  . Smoking status: Current Every Day Smoker    Packs/day: 0.50    Types: Cigarettes  . Smokeless tobacco: Never Used  . Alcohol use No    Review of Systems Constitutional: No fever/chills Eyes: No visual changes. ENT: No  sore throat. No stiff neck no neck pain Cardiovascular: Denies chest pain. Respiratory: Denies shortness of breath. Gastrointestinal:   no vomiting.  No diarrhea.  No constipation. Genitourinary: Negative for dysuria. Musculoskeletal: Negative lower extremity swelling Skin: Negative for rash. Neurological: Negative for severe headaches, focal weakness or numbness.   ____________________________________________   PHYSICAL EXAM:  VITAL SIGNS: ED Triage Vitals  Enc Vitals Group     BP 03/23/17 0811 105/77     Pulse Rate 03/23/17 0811 80     Resp 03/23/17 0811 12     Temp 03/23/17 0811 98.6 F (37 C)     Temp Source 03/23/17  0811 Oral     SpO2 03/23/17 0811 97 %     Weight 03/23/17 0812 195 lb (88.5 kg)     Height 03/23/17 0812  (1.88 m)     Head Circumference --      Peak Flow --      Pain Score 03/23/17 0810 10     Pain Loc --      Pain Edu? --      Excl. in GC? --     Constitutional: Alert and oriented. Well appearing and in no acute distress. Eyes: Conjunctivae are normal Head: Atraumatic HEENT: No congestion/rhinnorhea. Mucous membranes are moist.  Oropharynx non-erythematous Neck:   Nontender with no meningismus, no masses, no stridor Cardiovascular: Normal rate, regular rhythm. Grossly normal heart sounds.  Good peripheral circulation. Respiratory: Normal respiratory effort.  No retractions. Lungs CTAB. Abdominal: Soft and nontender. No distention. No guarding no rebound Back:  there is tenderness palpation in the lower back, mostly in the paraspinal muscles, does cross the midline. No other back pain noted.  there is no midline tenderness there are no lesions noted. there is no CVA tenderness Musculoskeletal: obvious chronic deformity which is quite significant to the left leg with callus formation from walking on the side of his foot. Mildly tender to palpation pulses are present., no upper extremity tenderness. No joint effusions, no DVT signs strong distal pulses no edema Neurologic:  Normal speech and language. No gross focal neurologic deficits are appreciated.  Skin:  Skin is warm, dry and intact. No rash noted. Psychiatric: Mood and affect are normal. Speech and behavior are normal.  ____________________________________________   LABS (all labs ordered are listed, but only abnormal results are displayed)  Labs Reviewed  CBC WITH DIFFERENTIAL/PLATELET - Abnormal; Notable for the following:       Result Value   HCT 39.1 (*)    Platelets 141 (*)    All other components within normal limits  BASIC METABOLIC PANEL - Abnormal; Notable for the following:    Potassium 2.9 (*)     Chloride 100 (*)    Glucose, Bld 139 (*)    Calcium 8.8 (*)    All other components within normal limits  URINE DRUG SCREEN, QUALITATIVE ([facility] ONLY)  URINALYSIS, COMPLETE ([facility]) WITH MICROSCOPIC    Pertinent labs  results that were available during my care of the patient were reviewed by me and considered in my medical decision making (see chart for details). ____________________________________________  EKG  I personally interpreted any EKGs ordered by me or triage  ____________________________________________  RADIOLOGY  Pertinent labs & imaging results that were available during my care of the patient were reviewed by me and considered in my medical decision making (see chart for details). If possible, patient and/or family made aware of any abnormal findings. ____________________________________________    PROCEDURES  Procedure(s)  performed: None  Procedures  Critical Care performed: None  ____________________________________________   INITIAL IMPRESSION / ASSESSMENT AND PLAN / ED COURSE  Pertinent labs & imaging results that were available during my care of the patient were reviewed by me and considered in my medical decision making (see chart for details).  patient here with a non-syncopal fall landing on his already deformed ankle which he has refused to have repair as well as low back pain. We will obtain imaging. Patient in no acute distress. He is using profanity and is verbally abusive towards staff. I don't see any acute injury requiring acute narcotics this time but we'll certainly obtain an x-ray to see if there is any acute pathology. In addition,there is no evidence of knee or hip pain or tenderness to exam. Given patient's generalized state of unkempt body habitus, we'll I did also do basic blood work to ensure that he is not in renal failure other pathology. Potassium somewhat low we will replete that otherwise, unremarkable blood work which is  reassuring  ----------------------------------------- 9:52 AM on 03/23/2017 -----------------------------------------  patient sleeping comfortably in the room he states is because he was up all night watching television until he fell to the floor. He refusesfurther imaging or workup, he easily arouses to verbal stimuli, he is not requesting anything for pain at this time. X-ray does not show any acute injury and he is refused care of that leg anyway. Return precautions and follow-up given and understood, he has no pain to palpation of hip or ankle or knee this time.    ____________________________________________   FINAL CLINICAL IMPRESSION(S) / ED DIAGNOSES  Final diagnoses:  None      This chart was dictated using voice recognition software.  Despite best efforts to proofread,  errors can occur which can change meaning.      Jeanmarie Plant, MD 03/23/17 1610    Jeanmarie Plant, MD 03/23/17 [id number]    Jeanmarie Plant, MD 03/23/17 5409    Jeanmarie Plant, MD 03/23/17 [phone]

## 2017-07-03 DIAGNOSIS — S99912A Unspecified injury of left ankle, initial encounter: Secondary | ICD-10-CM | POA: Diagnosis not present

## 2017-07-03 DIAGNOSIS — M5126 Other intervertebral disc displacement, lumbar region: Secondary | ICD-10-CM | POA: Diagnosis not present

## 2017-07-03 DIAGNOSIS — S82892A Other fracture of left lower leg, initial encounter for closed fracture: Secondary | ICD-10-CM | POA: Diagnosis not present

## 2017-07-03 DIAGNOSIS — M21962 Unspecified acquired deformity of left lower leg: Secondary | ICD-10-CM | POA: Diagnosis not present

## 2017-07-03 DIAGNOSIS — S8252XD Displaced fracture of medial malleolus of left tibia, subsequent encounter for closed fracture with routine healing: Secondary | ICD-10-CM | POA: Diagnosis not present

## 2017-07-03 DIAGNOSIS — S82309D Unspecified fracture of lower end of unspecified tibia, subsequent encounter for closed fracture with routine healing: Secondary | ICD-10-CM | POA: Diagnosis not present

## 2017-07-03 DIAGNOSIS — S8253XD Displaced fracture of medial malleolus of unspecified tibia, subsequent encounter for closed fracture with routine healing: Secondary | ICD-10-CM | POA: Diagnosis not present

## 2017-07-03 DIAGNOSIS — Z743 Need for continuous supervision: Secondary | ICD-10-CM | POA: Diagnosis not present

## 2017-07-03 DIAGNOSIS — M47816 Spondylosis without myelopathy or radiculopathy, lumbar region: Secondary | ICD-10-CM | POA: Diagnosis not present

## 2017-07-03 DIAGNOSIS — M1612 Unilateral primary osteoarthritis, left hip: Secondary | ICD-10-CM | POA: Diagnosis not present

## 2017-07-03 DIAGNOSIS — S82209D Unspecified fracture of shaft of unspecified tibia, subsequent encounter for closed fracture with routine healing: Secondary | ICD-10-CM | POA: Diagnosis not present

## 2017-11-18 ENCOUNTER — Encounter: Payer: Self-pay | Admitting: Emergency Medicine

## 2017-11-18 ENCOUNTER — Emergency Department
Admission: EM | Admit: 2017-11-18 | Discharge: 2017-11-18 | Disposition: A | Discharge status: Home / Self Care | Payer: Medicare HMO | Attending: Emergency Medicine | Admitting: Emergency Medicine

## 2017-11-18 DIAGNOSIS — Z96651 Presence of right artificial knee joint: Secondary | ICD-10-CM | POA: Insufficient documentation

## 2017-11-18 DIAGNOSIS — Z760 Encounter for issue of repeat prescription: Secondary | ICD-10-CM | POA: Diagnosis not present

## 2017-11-18 DIAGNOSIS — F1410 Cocaine abuse, uncomplicated: Secondary | ICD-10-CM | POA: Diagnosis not present

## 2017-11-18 DIAGNOSIS — E08621 Diabetes mellitus due to underlying condition with foot ulcer: Secondary | ICD-10-CM

## 2017-11-18 DIAGNOSIS — L97401 Non-pressure chronic ulcer of unspecified heel and midfoot limited to breakdown of skin: Secondary | ICD-10-CM | POA: Diagnosis not present

## 2017-11-18 DIAGNOSIS — G629 Polyneuropathy, unspecified: Secondary | ICD-10-CM | POA: Diagnosis not present

## 2017-11-18 DIAGNOSIS — F17210 Nicotine dependence, cigarettes, uncomplicated: Secondary | ICD-10-CM | POA: Diagnosis not present

## 2017-11-18 DIAGNOSIS — E11621 Type 2 diabetes mellitus with foot ulcer: Secondary | ICD-10-CM | POA: Diagnosis not present

## 2017-11-18 DIAGNOSIS — R200 Anesthesia of skin: Secondary | ICD-10-CM | POA: Diagnosis present

## 2017-11-18 DIAGNOSIS — I10 Essential (primary) hypertension: Secondary | ICD-10-CM | POA: Diagnosis not present

## 2017-11-18 LAB — CBC WITH DIFFERENTIAL/PLATELET
BASOS PCT: 1 %
Basophils Absolute: 0.1 10*3/uL (ref 0–0.1)
EOS PCT: 3 %
Eosinophils Absolute: 0.2 10*3/uL (ref 0–0.7)
HEMATOCRIT: 46.7 % (ref 40.0–52.0)
Hemoglobin: 15.5 g/dL (ref 13.0–18.0)
Lymphocytes Relative: 27 %
Lymphs Abs: 1.7 10*3/uL (ref 1.0–3.6)
MCH: 26.3 pg (ref 26.0–34.0)
MCHC: 33.2 g/dL (ref 32.0–36.0)
MCV: 79.4 fL — AB (ref 80.0–100.0)
MONO ABS: 0.4 10*3/uL (ref 0.2–1.0)
MONOS PCT: 6 %
NEUTROS ABS: 4.1 10*3/uL (ref 1.4–6.5)
Neutrophils Relative %: 63 %
PLATELETS: 182 10*3/uL (ref 150–440)
RBC: 5.88 MIL/uL (ref 4.40–5.90)
RDW: 14.9 % — AB (ref 11.5–14.5)
WBC: 6.4 10*3/uL (ref 3.8–10.6)

## 2017-11-18 LAB — COMPREHENSIVE METABOLIC PANEL
ALBUMIN: 4.5 g/dL (ref 3.5–5.0)
ALT: 17 U/L (ref 17–63)
AST: 29 U/L (ref 15–41)
Alkaline Phosphatase: 76 U/L (ref 38–126)
Anion gap: 12 (ref 5–15)
BUN: 9 mg/dL (ref 6–20)
CO2: 23 mmol/L (ref 22–32)
Calcium: 9.6 mg/dL (ref 8.9–10.3)
Chloride: 98 mmol/L — ABNORMAL LOW (ref 101–111)
Creatinine, Ser: 0.8 mg/dL (ref 0.61–1.24)
GFR calc Af Amer: >60 mL/min (ref >60)
GFR calc non Af Amer: >60 mL/min (ref >60)
GLUCOSE: 211 mg/dL — AB (ref 65–99)
Potassium: 4.1 mmol/L (ref 3.5–5.1)
SODIUM: 133 mmol/L — AB (ref 135–145)
Total Bilirubin: 0.5 mg/dL (ref 0.3–1.2)
Total Protein: 9 g/dL — ABNORMAL HIGH (ref 6.5–8.1)

## 2017-11-18 MED ORDER — METFORMIN HCL 1000 MG PO TABS: 1000.0000 mg | ORAL_TABLET | Freq: Two times a day (BID) | ORAL | 1 refills | Status: DC

## 2017-11-18 MED ORDER — CLONIDINE HCL 0.3 MG/24HR TD PTWK: 0.3000 mg | MEDICATED_PATCH | TRANSDERMAL | 11 refills | Status: DC

## 2017-11-18 MED ORDER — LYRICA 150 MG PO CAPS: 150.0000 mg | ORAL_CAPSULE | Freq: Two times a day (BID) | ORAL | 4 refills | Status: AC

## 2017-11-18 NOTE — ED Notes (Signed)
Wet to dry dressing applied to R heal. Wet to dry dressing applied per MD order.

## 2017-11-18 NOTE — ED Notes (Signed)
NAD noted at time of D/C. Pt taken to Room 10 where his wife is upon D/C per his request via wheelchair. Pt denies comments/concerns regarding D/C instructions.

## 2017-11-18 NOTE — ED Triage Notes (Signed)
Pt comes into the ED via POV c/o left foot numbness and sores on the bottom of his right foot.  Patient is diabetic and has neuropathy.  Patient states the numbness started two days ago.  Patient denies any new weakness, slurred speech, and he is neurologically intact. Patient states he ran out of his BP medication yesterday as well.

## 2017-11-18 NOTE — ED Provider Notes (Signed)
[facility] Emergency Department Provider Note       Time seen: ----------------------------------------- 2:10 PM on 11/18/2017 -----------------------------------------   I have reviewed the triage vital signs and the nursing notes.  HISTORY   Chief Complaint Numbness    HPI Joshua Gordon is a 62 y.o. male with a history of chronic pain, diabetes, hypertension, substance abuse and renal disorder who presents to the ED for left foot numbness with diabetic ulceration on his right heel.  Patient states he is diabetic and has neuropathy.  Patient reports being in [place] for an extensive period of time and typically is from this area.  He has not had his medications recently.  Numbness started several days ago.  He denies any weakness, slurred speech or other neurologic complaints.  He also ran out of his blood pressure medicines yesterday.  Past Medical History:  Diagnosis Date  . Chronic pain   . Diabetes mellitus without complication (HCC)   . Hypertension   . Non compliance with medical treatment   . Pain management    contract for pain management  . Polysubstance abuse (HCC)   . Renal disorder     There are no active problems to display for this patient.   Past Surgical History:  Procedure Laterality Date  . FEMUR FRACTURE SURGERY Left   . KNEE SURGERY Left 1991   5 times  . REPLACEMENT TOTAL KNEE Right    twice    Allergies Patient has no known allergies.  Social History Social History   Tobacco Use  . Smoking status: Current Every Day Smoker    Packs/day: 0.50    Types: Cigarettes  . Smokeless tobacco: Never Used  Substance Use Topics  . Alcohol use: No  . Drug use: Yes    Types: Cocaine    Comment: opiates   Review of Systems Constitutional: Negative for fever. Cardiovascular: Negative for chest pain. Respiratory: Negative for shortness of breath. Gastrointestinal: Negative for abdominal pain, vomiting and  diarrhea. Musculoskeletal: Negative for back pain. Skin: Negative for rash. Neurological: Negative for headaches, positive for numbness  All systems negative/normal/unremarkable except as stated in the HPI  ____________________________________________   PHYSICAL EXAM:  VITAL SIGNS: ED Triage Vitals  Enc Vitals Group     BP 11/18/17 1216 (!) 159/100     Pulse Rate 11/18/17 1216 83     Resp 11/18/17 1216 16     Temp 11/18/17 1216 99.8 F (37.7 C)     Temp src --      SpO2 11/18/17 1216 95 %     Weight 11/18/17 1217 200 lb (90.7 kg)     Height 11/18/17 1217 6\' 1"  (1.854 m)     Head Circumference --      Peak Flow --      Pain Score 11/18/17 1217 8     Pain Loc --      Pain Edu? --      Excl. in [place]? --    Constitutional: Alert and oriented. Well appearing and in no distress. Eyes: Conjunctivae are normal. Normal extraocular movements. Cardiovascular: Normal rate, regular rhythm. No murmurs, rubs, or gallops. Respiratory: Normal respiratory effort without tachypnea nor retractions. Breath sounds are clear and equal bilaterally. No wheezes/rales/rhonchi. Gastrointestinal: Soft and nontender. Normal bowel sounds Musculoskeletal: Extensive varus deformity around the left ankle.  There is significant callus formation over the lateral aspect of the left foot due to this deformity.  There is an ulceration on the right  heel that does not appear to be infected Neurologic:  Normal speech and language. No gross focal neurologic deficits are appreciated.  Skin: Right heel ulceration Psychiatric: Mood and affect are normal. Speech and behavior are normal.  ____________________________________________  ED COURSE:  As part of my medical decision making, I reviewed the following data within the electronic MEDICAL RECORD NUMBER History obtained from family if available, nursing notes, old chart and ekg, as well as notes from prior ED visits. Patient presented for numbness, ulceration and  medication refill, we will assess with labs as indicated at this time.  Patient will require fresh wound dressing to be placed.   Procedures ____________________________________________   LABS (pertinent positives/negatives)  Labs Reviewed  COMPREHENSIVE METABOLIC PANEL - Abnormal; Notable for the following components:      Result Value   Sodium 133 (*)    Chloride 98 (*)    Glucose, Bld 211 (*)    Total Protein 9.0 (*)    All other components within normal limits  CBC WITH DIFFERENTIAL/PLATELET - Abnormal; Notable for the following components:   MCV 79.4 (*)    RDW 14.9 (*)    All other components within normal limits  URINALYSIS, COMPLETE (UACMP) WITH MICROSCOPIC   ____________________________________________  DIFFERENTIAL DIAGNOSIS   Diabetic foot ulcer, diabetic neuropathy, medication refill, hypertension, medication noncompliance  FINAL ASSESSMENT AND PLAN  Diabetic ulceration, neuropathy, prescription refill   Plan: The patient had presented for multiple complaints. Patient's labs were surprisingly reassuring.  His findings are likely acute on chronic.  We will reapply dressing to his right heel.  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  He will be referred to primary care, wound clinic and podiatry.  He is cleared for outpatient follow-up.  I was able to refill many of his medications.   Ulice DashJohnathan E Williams, MD   Note: This note was generated in part or whole with voice recognition software. Voice recognition is usually quite accurate but there are transcription errors that can and very often do occur. I apologize for any typographical errors that were not detected and corrected.     Emily FilbertWilliams, Jonathan E, MD 11/18/17 1414

## 2017-11-26 DIAGNOSIS — F1410 Cocaine abuse, uncomplicated: Secondary | ICD-10-CM | POA: Diagnosis not present

## 2017-11-26 DIAGNOSIS — I10 Essential (primary) hypertension: Secondary | ICD-10-CM | POA: Diagnosis not present

## 2017-11-26 DIAGNOSIS — F17200 Nicotine dependence, unspecified, uncomplicated: Secondary | ICD-10-CM | POA: Diagnosis not present

## 2017-11-26 DIAGNOSIS — F1120 Opioid dependence, uncomplicated: Secondary | ICD-10-CM | POA: Diagnosis not present

## 2018-01-05 DIAGNOSIS — R6889 Other general symptoms and signs: Secondary | ICD-10-CM | POA: Diagnosis not present

## 2018-01-12 DIAGNOSIS — R6889 Other general symptoms and signs: Secondary | ICD-10-CM | POA: Diagnosis not present

## 2018-01-13 DIAGNOSIS — R6889 Other general symptoms and signs: Secondary | ICD-10-CM | POA: Diagnosis not present

## 2018-01-17 DIAGNOSIS — R6889 Other general symptoms and signs: Secondary | ICD-10-CM | POA: Diagnosis not present

## 2018-01-18 DIAGNOSIS — R6889 Other general symptoms and signs: Secondary | ICD-10-CM | POA: Diagnosis not present

## 2018-01-18 DIAGNOSIS — I10 Essential (primary) hypertension: Secondary | ICD-10-CM | POA: Diagnosis not present

## 2018-01-18 DIAGNOSIS — Z125 Encounter for screening for malignant neoplasm of prostate: Secondary | ICD-10-CM | POA: Diagnosis not present

## 2018-01-18 DIAGNOSIS — E119 Type 2 diabetes mellitus without complications: Secondary | ICD-10-CM | POA: Diagnosis not present

## 2018-01-18 DIAGNOSIS — F1120 Opioid dependence, uncomplicated: Secondary | ICD-10-CM | POA: Diagnosis not present

## 2018-01-19 ENCOUNTER — Ambulatory Visit: Payer: Medicare HMO | Admitting: Nurse Practitioner

## 2018-01-25 DIAGNOSIS — F1120 Opioid dependence, uncomplicated: Secondary | ICD-10-CM | POA: Diagnosis not present

## 2018-01-25 DIAGNOSIS — I10 Essential (primary) hypertension: Secondary | ICD-10-CM | POA: Diagnosis not present

## 2018-01-25 DIAGNOSIS — E1140 Type 2 diabetes mellitus with diabetic neuropathy, unspecified: Secondary | ICD-10-CM | POA: Diagnosis not present

## 2018-01-25 DIAGNOSIS — R6889 Other general symptoms and signs: Secondary | ICD-10-CM | POA: Diagnosis not present

## 2018-01-25 DIAGNOSIS — E119 Type 2 diabetes mellitus without complications: Secondary | ICD-10-CM | POA: Diagnosis not present

## 2018-02-08 DIAGNOSIS — I10 Essential (primary) hypertension: Secondary | ICD-10-CM | POA: Diagnosis not present

## 2018-02-08 DIAGNOSIS — E119 Type 2 diabetes mellitus without complications: Secondary | ICD-10-CM | POA: Diagnosis not present

## 2018-02-08 DIAGNOSIS — F1120 Opioid dependence, uncomplicated: Secondary | ICD-10-CM | POA: Diagnosis not present

## 2018-02-08 DIAGNOSIS — E1140 Type 2 diabetes mellitus with diabetic neuropathy, unspecified: Secondary | ICD-10-CM | POA: Diagnosis not present

## 2018-02-08 DIAGNOSIS — R6889 Other general symptoms and signs: Secondary | ICD-10-CM | POA: Diagnosis not present

## 2018-02-22 DIAGNOSIS — E1140 Type 2 diabetes mellitus with diabetic neuropathy, unspecified: Secondary | ICD-10-CM | POA: Diagnosis not present

## 2018-02-22 DIAGNOSIS — Z79899 Other long term (current) drug therapy: Secondary | ICD-10-CM | POA: Diagnosis not present

## 2018-03-02 DIAGNOSIS — S99929A Unspecified injury of unspecified foot, initial encounter: Secondary | ICD-10-CM | POA: Diagnosis not present

## 2018-03-02 DIAGNOSIS — M86171 Other acute osteomyelitis, right ankle and foot: Secondary | ICD-10-CM | POA: Diagnosis not present

## 2018-03-02 DIAGNOSIS — R262 Difficulty in walking, not elsewhere classified: Secondary | ICD-10-CM | POA: Diagnosis not present

## 2018-03-29 DIAGNOSIS — M545 Low back pain: Secondary | ICD-10-CM | POA: Diagnosis not present

## 2018-03-29 DIAGNOSIS — Z79899 Other long term (current) drug therapy: Secondary | ICD-10-CM | POA: Diagnosis not present

## 2018-03-29 DIAGNOSIS — E1140 Type 2 diabetes mellitus with diabetic neuropathy, unspecified: Secondary | ICD-10-CM | POA: Diagnosis not present

## 2018-03-29 DIAGNOSIS — M25572 Pain in left ankle and joints of left foot: Secondary | ICD-10-CM | POA: Diagnosis not present

## 2018-04-01 DIAGNOSIS — S99929A Unspecified injury of unspecified foot, initial encounter: Secondary | ICD-10-CM | POA: Diagnosis not present

## 2018-04-01 DIAGNOSIS — R262 Difficulty in walking, not elsewhere classified: Secondary | ICD-10-CM | POA: Diagnosis not present

## 2018-04-01 DIAGNOSIS — M86171 Other acute osteomyelitis, right ankle and foot: Secondary | ICD-10-CM | POA: Diagnosis not present

## 2018-04-26 DIAGNOSIS — E119 Type 2 diabetes mellitus without complications: Secondary | ICD-10-CM | POA: Diagnosis not present

## 2018-04-26 DIAGNOSIS — M25572 Pain in left ankle and joints of left foot: Secondary | ICD-10-CM | POA: Diagnosis not present

## 2018-04-26 DIAGNOSIS — Z79899 Other long term (current) drug therapy: Secondary | ICD-10-CM | POA: Diagnosis not present

## 2018-05-02 DIAGNOSIS — S99929A Unspecified injury of unspecified foot, initial encounter: Secondary | ICD-10-CM | POA: Diagnosis not present

## 2018-05-02 DIAGNOSIS — M86171 Other acute osteomyelitis, right ankle and foot: Secondary | ICD-10-CM | POA: Diagnosis not present

## 2018-05-02 DIAGNOSIS — R262 Difficulty in walking, not elsewhere classified: Secondary | ICD-10-CM | POA: Diagnosis not present

## 2018-06-01 DIAGNOSIS — M86171 Other acute osteomyelitis, right ankle and foot: Secondary | ICD-10-CM | POA: Diagnosis not present

## 2018-06-01 DIAGNOSIS — R262 Difficulty in walking, not elsewhere classified: Secondary | ICD-10-CM | POA: Diagnosis not present

## 2018-06-01 DIAGNOSIS — S99929A Unspecified injury of unspecified foot, initial encounter: Secondary | ICD-10-CM | POA: Diagnosis not present

## 2018-07-02 DIAGNOSIS — M86171 Other acute osteomyelitis, right ankle and foot: Secondary | ICD-10-CM | POA: Diagnosis not present

## 2018-07-02 DIAGNOSIS — R262 Difficulty in walking, not elsewhere classified: Secondary | ICD-10-CM | POA: Diagnosis not present

## 2018-07-02 DIAGNOSIS — S99929A Unspecified injury of unspecified foot, initial encounter: Secondary | ICD-10-CM | POA: Diagnosis not present

## 2018-08-02 DIAGNOSIS — Z7984 Long term (current) use of oral hypoglycemic drugs: Secondary | ICD-10-CM | POA: Diagnosis not present

## 2018-08-02 DIAGNOSIS — E1140 Type 2 diabetes mellitus with diabetic neuropathy, unspecified: Secondary | ICD-10-CM | POA: Diagnosis not present

## 2018-08-02 DIAGNOSIS — M79671 Pain in right foot: Secondary | ICD-10-CM | POA: Diagnosis not present

## 2018-08-02 DIAGNOSIS — I10 Essential (primary) hypertension: Secondary | ICD-10-CM | POA: Diagnosis not present

## 2018-08-02 DIAGNOSIS — S99929A Unspecified injury of unspecified foot, initial encounter: Secondary | ICD-10-CM | POA: Diagnosis not present

## 2018-08-02 DIAGNOSIS — M86171 Other acute osteomyelitis, right ankle and foot: Secondary | ICD-10-CM | POA: Diagnosis not present

## 2018-08-02 DIAGNOSIS — E11621 Type 2 diabetes mellitus with foot ulcer: Secondary | ICD-10-CM | POA: Diagnosis not present

## 2018-08-02 DIAGNOSIS — R262 Difficulty in walking, not elsewhere classified: Secondary | ICD-10-CM | POA: Diagnosis not present

## 2018-08-31 DIAGNOSIS — R262 Difficulty in walking, not elsewhere classified: Secondary | ICD-10-CM | POA: Diagnosis not present

## 2018-08-31 DIAGNOSIS — M86171 Other acute osteomyelitis, right ankle and foot: Secondary | ICD-10-CM | POA: Diagnosis not present

## 2018-08-31 DIAGNOSIS — S99929A Unspecified injury of unspecified foot, initial encounter: Secondary | ICD-10-CM | POA: Diagnosis not present

## 2018-09-07 DIAGNOSIS — R600 Localized edema: Secondary | ICD-10-CM | POA: Diagnosis not present

## 2018-09-07 DIAGNOSIS — E1140 Type 2 diabetes mellitus with diabetic neuropathy, unspecified: Secondary | ICD-10-CM | POA: Diagnosis not present

## 2018-09-07 DIAGNOSIS — Z9181 History of falling: Secondary | ICD-10-CM | POA: Diagnosis not present

## 2018-09-07 DIAGNOSIS — M25512 Pain in left shoulder: Secondary | ICD-10-CM | POA: Diagnosis not present

## 2018-09-07 DIAGNOSIS — M25511 Pain in right shoulder: Secondary | ICD-10-CM | POA: Diagnosis not present

## 2019-05-25 DIAGNOSIS — M25511 Pain in right shoulder: Secondary | ICD-10-CM | POA: Diagnosis not present

## 2019-05-25 DIAGNOSIS — E1140 Type 2 diabetes mellitus with diabetic neuropathy, unspecified: Secondary | ICD-10-CM | POA: Diagnosis not present

## 2019-05-25 DIAGNOSIS — M25512 Pain in left shoulder: Secondary | ICD-10-CM | POA: Diagnosis not present

## 2019-05-25 DIAGNOSIS — R600 Localized edema: Secondary | ICD-10-CM | POA: Diagnosis not present

## 2019-05-25 DIAGNOSIS — Z9181 History of falling: Secondary | ICD-10-CM | POA: Diagnosis not present

## 2019-06-07 DIAGNOSIS — M6281 Muscle weakness (generalized): Secondary | ICD-10-CM | POA: Diagnosis not present

## 2019-06-07 DIAGNOSIS — E1140 Type 2 diabetes mellitus with diabetic neuropathy, unspecified: Secondary | ICD-10-CM | POA: Diagnosis not present

## 2019-06-07 DIAGNOSIS — Z7984 Long term (current) use of oral hypoglycemic drugs: Secondary | ICD-10-CM | POA: Diagnosis not present

## 2019-06-07 DIAGNOSIS — I10 Essential (primary) hypertension: Secondary | ICD-10-CM | POA: Diagnosis not present

## 2019-06-07 DIAGNOSIS — Z79899 Other long term (current) drug therapy: Secondary | ICD-10-CM | POA: Diagnosis not present

## 2019-10-03 ENCOUNTER — Emergency Department: Payer: Medicare Other

## 2019-10-03 ENCOUNTER — Inpatient Hospital Stay
Admission: EM | Admit: 2019-10-03 | Discharge: 2019-10-06 | DRG: 565 | Disposition: A | Discharge status: Home Health | Payer: Medicare Other | Attending: Internal Medicine | Admitting: Internal Medicine

## 2019-10-03 ENCOUNTER — Other Ambulatory Visit: Payer: Self-pay

## 2019-10-03 DIAGNOSIS — E876 Hypokalemia: Secondary | ICD-10-CM | POA: Diagnosis not present

## 2019-10-03 DIAGNOSIS — E860 Dehydration: Secondary | ICD-10-CM

## 2019-10-03 DIAGNOSIS — A419 Sepsis, unspecified organism: Secondary | ICD-10-CM

## 2019-10-03 DIAGNOSIS — Z7984 Long term (current) use of oral hypoglycemic drugs: Secondary | ICD-10-CM

## 2019-10-03 DIAGNOSIS — Z20822 Contact with and (suspected) exposure to covid-19: Secondary | ICD-10-CM | POA: Diagnosis present

## 2019-10-03 DIAGNOSIS — Z79899 Other long term (current) drug therapy: Secondary | ICD-10-CM | POA: Diagnosis not present

## 2019-10-03 DIAGNOSIS — S0990XA Unspecified injury of head, initial encounter: Secondary | ICD-10-CM | POA: Diagnosis not present

## 2019-10-03 DIAGNOSIS — M2550 Pain in unspecified joint: Secondary | ICD-10-CM | POA: Diagnosis not present

## 2019-10-03 DIAGNOSIS — M79601 Pain in right arm: Secondary | ICD-10-CM | POA: Diagnosis present

## 2019-10-03 DIAGNOSIS — L97529 Non-pressure chronic ulcer of other part of left foot with unspecified severity: Secondary | ICD-10-CM | POA: Diagnosis not present

## 2019-10-03 DIAGNOSIS — R531 Weakness: Secondary | ICD-10-CM

## 2019-10-03 DIAGNOSIS — Z96651 Presence of right artificial knee joint: Secondary | ICD-10-CM | POA: Diagnosis not present

## 2019-10-03 DIAGNOSIS — R0902 Hypoxemia: Secondary | ICD-10-CM | POA: Diagnosis not present

## 2019-10-03 DIAGNOSIS — E1165 Type 2 diabetes mellitus with hyperglycemia: Secondary | ICD-10-CM | POA: Diagnosis not present

## 2019-10-03 DIAGNOSIS — M6282 Rhabdomyolysis: Secondary | ICD-10-CM | POA: Diagnosis present

## 2019-10-03 DIAGNOSIS — M7989 Other specified soft tissue disorders: Secondary | ICD-10-CM | POA: Diagnosis not present

## 2019-10-03 DIAGNOSIS — E119 Type 2 diabetes mellitus without complications: Secondary | ICD-10-CM | POA: Diagnosis not present

## 2019-10-03 DIAGNOSIS — Z993 Dependence on wheelchair: Secondary | ICD-10-CM

## 2019-10-03 DIAGNOSIS — Z7401 Bed confinement status: Secondary | ICD-10-CM | POA: Diagnosis not present

## 2019-10-03 DIAGNOSIS — R52 Pain, unspecified: Secondary | ICD-10-CM | POA: Diagnosis not present

## 2019-10-03 DIAGNOSIS — R509 Fever, unspecified: Secondary | ICD-10-CM | POA: Diagnosis not present

## 2019-10-03 DIAGNOSIS — L03116 Cellulitis of left lower limb: Secondary | ICD-10-CM | POA: Diagnosis not present

## 2019-10-03 DIAGNOSIS — L03119 Cellulitis of unspecified part of limb: Secondary | ICD-10-CM | POA: Diagnosis not present

## 2019-10-03 DIAGNOSIS — L03115 Cellulitis of right lower limb: Secondary | ICD-10-CM | POA: Diagnosis present

## 2019-10-03 DIAGNOSIS — T796XXA Traumatic ischemia of muscle, initial encounter: Secondary | ICD-10-CM | POA: Diagnosis not present

## 2019-10-03 DIAGNOSIS — T796XXD Traumatic ischemia of muscle, subsequent encounter: Secondary | ICD-10-CM

## 2019-10-03 DIAGNOSIS — F17210 Nicotine dependence, cigarettes, uncomplicated: Secondary | ICD-10-CM | POA: Diagnosis present

## 2019-10-03 DIAGNOSIS — I10 Essential (primary) hypertension: Secondary | ICD-10-CM | POA: Diagnosis not present

## 2019-10-03 DIAGNOSIS — R000 Tachycardia, unspecified: Secondary | ICD-10-CM | POA: Diagnosis not present

## 2019-10-03 DIAGNOSIS — L0390 Cellulitis, unspecified: Secondary | ICD-10-CM | POA: Diagnosis present

## 2019-10-03 DIAGNOSIS — G8929 Other chronic pain: Secondary | ICD-10-CM | POA: Diagnosis not present

## 2019-10-03 DIAGNOSIS — R5381 Other malaise: Secondary | ICD-10-CM | POA: Diagnosis not present

## 2019-10-03 DIAGNOSIS — W050XXA Fall from non-moving wheelchair, initial encounter: Secondary | ICD-10-CM | POA: Diagnosis present

## 2019-10-03 DIAGNOSIS — R404 Transient alteration of awareness: Secondary | ICD-10-CM | POA: Diagnosis not present

## 2019-10-03 LAB — GLUCOSE, CAPILLARY
Glucose-Capillary: 281 mg/dL — ABNORMAL HIGH (ref 70–99)
Glucose-Capillary: 289 mg/dL — ABNORMAL HIGH (ref 70–99)

## 2019-10-03 LAB — LACTIC ACID, PLASMA
Lactic Acid, Venous: 1.4 mmol/L (ref 0.5–1.9)
Lactic Acid, Venous: 1.7 mmol/L (ref 0.5–1.9)

## 2019-10-03 LAB — CBC WITH DIFFERENTIAL/PLATELET
Abs Immature Granulocytes: 0.06 10*3/uL (ref 0.00–0.07)
Basophils Absolute: 0 10*3/uL (ref 0.0–0.1)
Basophils Relative: 0 %
Eosinophils Absolute: 0 10*3/uL (ref 0.0–0.5)
Eosinophils Relative: 0 %
HCT: 43.9 % (ref 39.0–52.0)
Hemoglobin: 14.1 g/dL (ref 13.0–17.0)
Immature Granulocytes: 1 %
Lymphocytes Relative: 11 %
Lymphs Abs: 1.2 10*3/uL (ref 0.7–4.0)
MCH: 26.5 pg (ref 26.0–34.0)
MCHC: 32.1 g/dL (ref 30.0–36.0)
MCV: 82.4 fL (ref 80.0–100.0)
Monocytes Absolute: 0.8 10*3/uL (ref 0.1–1.0)
Monocytes Relative: 7 %
Neutro Abs: 9.5 10*3/uL — ABNORMAL HIGH (ref 1.7–7.7)
Neutrophils Relative %: 81 %
Platelets: 142 10*3/uL — ABNORMAL LOW (ref 150–400)
RBC: 5.33 MIL/uL (ref 4.22–5.81)
RDW: 13.4 % (ref 11.5–15.5)
WBC: 11.6 10*3/uL — ABNORMAL HIGH (ref 4.0–10.5)
nRBC: 0 % (ref 0.0–0.2)

## 2019-10-03 LAB — URINALYSIS, COMPLETE (UACMP) WITH MICROSCOPIC
Bacteria, UA: NONE SEEN
Bilirubin Urine: NEGATIVE
Glucose, UA: >=500 mg/dL — AB
Ketones, ur: NEGATIVE mg/dL
Leukocytes,Ua: NEGATIVE
Nitrite: NEGATIVE
Protein, ur: 100 mg/dL — AB
Specific Gravity, Urine: 1.021 (ref 1.005–1.030)
Squamous Epithelial / HPF: NONE SEEN (ref 0–5)
pH: 5 (ref 5.0–8.0)

## 2019-10-03 LAB — COMPREHENSIVE METABOLIC PANEL
ALT: 26 U/L (ref 0–44)
AST: 92 U/L — ABNORMAL HIGH (ref 15–41)
Albumin: 3.9 g/dL (ref 3.5–5.0)
Alkaline Phosphatase: 80 U/L (ref 38–126)
Anion gap: 11 (ref 5–15)
BUN: 7 mg/dL — ABNORMAL LOW (ref 8–23)
CO2: 24 mmol/L (ref 22–32)
Calcium: 9.2 mg/dL (ref 8.9–10.3)
Chloride: 99 mmol/L (ref 98–111)
Creatinine, Ser: 0.79 mg/dL (ref 0.61–1.24)
GFR calc Af Amer: >60 mL/min (ref >60)
GFR calc non Af Amer: >60 mL/min (ref >60)
Glucose, Bld: 253 mg/dL — ABNORMAL HIGH (ref 70–99)
Potassium: 3.6 mmol/L (ref 3.5–5.1)
Sodium: 134 mmol/L — ABNORMAL LOW (ref 135–145)
Total Bilirubin: 1.2 mg/dL (ref 0.3–1.2)
Total Protein: 8.3 g/dL — ABNORMAL HIGH (ref 6.5–8.1)

## 2019-10-03 LAB — PROTIME-INR
INR: 1.2 (ref 0.8–1.2)
Prothrombin Time: 14.8 seconds (ref 11.4–15.2)

## 2019-10-03 LAB — APTT: aPTT: 35 seconds (ref 24–36)

## 2019-10-03 LAB — RESPIRATORY PANEL BY RT PCR (FLU A&B, COVID)
Influenza A by PCR: NEGATIVE
Influenza B by PCR: NEGATIVE
SARS Coronavirus 2 by RT PCR: NEGATIVE

## 2019-10-03 LAB — CK: Total CK: 5771 U/L — ABNORMAL HIGH (ref 49–397)

## 2019-10-03 LAB — PROCALCITONIN: Procalcitonin: <0.1 ng/mL

## 2019-10-03 MED ORDER — CEFAZOLIN SODIUM-DEXTROSE 1-4 GM/50ML-% IV SOLN: 1.0000 g | Freq: Three times a day (TID) | INTRAVENOUS | Status: DC

## 2019-10-03 MED ORDER — SODIUM CHLORIDE 0.9 % IV SOLN
2.0000 g | Freq: Once | INTRAVENOUS | Status: AC
  Administered 2019-10-03: 2 g via INTRAVENOUS
  Filled 2019-10-03: qty 2

## 2019-10-03 MED ORDER — LABETALOL HCL 5 MG/ML IV SOLN: 10.0000 mg | Freq: Four times a day (QID) | INTRAVENOUS | Status: DC | PRN

## 2019-10-03 MED ORDER — CITALOPRAM HYDROBROMIDE 20 MG PO TABS: 40.0000 mg | ORAL_TABLET | Freq: Every day | ORAL | Status: DC

## 2019-10-03 MED ORDER — ALPRAZOLAM 0.5 MG PO TABS
1.0000 mg | ORAL_TABLET | Freq: Two times a day (BID) | ORAL | Status: DC
  Administered 2019-10-03 – 2019-10-06 (×5): 1 mg via ORAL
  Filled 2019-10-03 (×6): qty 2

## 2019-10-03 MED ORDER — ENOXAPARIN SODIUM 40 MG/0.4ML ~~LOC~~ SOLN
40.0000 mg | SUBCUTANEOUS | Status: DC
  Administered 2019-10-03 – 2019-10-05 (×3): 40 mg via SUBCUTANEOUS
  Filled 2019-10-03 (×3): qty 0.4

## 2019-10-03 MED ORDER — PREGABALIN 75 MG PO CAPS
150.0000 mg | ORAL_CAPSULE | Freq: Two times a day (BID) | ORAL | Status: DC
  Administered 2019-10-03 – 2019-10-06 (×6): 150 mg via ORAL
  Filled 2019-10-03 (×6): qty 2

## 2019-10-03 MED ORDER — DILTIAZEM HCL-DEXTROSE 125-5 MG/125ML-% IV SOLN (PREMIX)
5.0000 mg/h | INTRAVENOUS | Status: DC
  Administered 2019-10-03: 5 mg/h via INTRAVENOUS
  Filled 2019-10-03: qty 125

## 2019-10-03 MED ORDER — ZOLPIDEM TARTRATE 5 MG PO TABS: 10.0000 mg | ORAL_TABLET | Freq: Every evening | ORAL | Status: DC | PRN

## 2019-10-03 MED ORDER — CEFAZOLIN SODIUM-DEXTROSE 1-4 GM/50ML-% IV SOLN
1.0000 g | Freq: Three times a day (TID) | INTRAVENOUS | Status: DC
  Administered 2019-10-03 – 2019-10-06 (×8): 1 g via INTRAVENOUS
  Filled 2019-10-03 (×13): qty 50

## 2019-10-03 MED ORDER — GABAPENTIN 600 MG PO TABS: 600.0000 mg | ORAL_TABLET | Freq: Four times a day (QID) | ORAL | Status: DC

## 2019-10-03 MED ORDER — VANCOMYCIN HCL IN DEXTROSE 1-5 GM/200ML-% IV SOLN
1000.0000 mg | Freq: Once | INTRAVENOUS | Status: AC
  Administered 2019-10-03: 1000 mg via INTRAVENOUS
  Filled 2019-10-03: qty 200

## 2019-10-03 MED ORDER — AMPHETAMINE-DEXTROAMPHET ER 5 MG PO CP24: 30.0000 mg | ORAL_CAPSULE | Freq: Every day | ORAL | Status: DC

## 2019-10-03 MED ORDER — ACETAMINOPHEN 325 MG PO TABS
650.0000 mg | ORAL_TABLET | Freq: Four times a day (QID) | ORAL | Status: DC | PRN
  Administered 2019-10-04: 650 mg via ORAL

## 2019-10-03 MED ORDER — INSULIN ASPART 100 UNIT/ML ~~LOC~~ SOLN
0.0000 [IU] | Freq: Three times a day (TID) | SUBCUTANEOUS | Status: DC
  Administered 2019-10-03: 8 [IU] via SUBCUTANEOUS
  Administered 2019-10-04 (×2): 5 [IU] via SUBCUTANEOUS
  Administered 2019-10-04: 11 [IU] via SUBCUTANEOUS
  Administered 2019-10-05: 8 [IU] via SUBCUTANEOUS
  Administered 2019-10-05: 3 [IU] via SUBCUTANEOUS
  Administered 2019-10-05 – 2019-10-06 (×2): 11 [IU] via SUBCUTANEOUS
  Administered 2019-10-06: 2 [IU] via SUBCUTANEOUS
  Filled 2019-10-03 (×9): qty 1

## 2019-10-03 MED ORDER — DILTIAZEM HCL 25 MG/5ML IV SOLN
10.0000 mg | Freq: Once | INTRAVENOUS | Status: AC
  Administered 2019-10-03: 10 mg via INTRAVENOUS
  Filled 2019-10-03: qty 5

## 2019-10-03 MED ORDER — SODIUM CHLORIDE 0.9 % IV BOLUS (SEPSIS)
1000.0000 mL | Freq: Once | INTRAVENOUS | Status: AC
  Administered 2019-10-03: 1000 mL via INTRAVENOUS

## 2019-10-03 MED ORDER — ACETAMINOPHEN 650 MG RE SUPP: 650.0000 mg | Freq: Four times a day (QID) | RECTAL | Status: DC | PRN

## 2019-10-03 MED ORDER — ONDANSETRON HCL 4 MG PO TABS: 4.0000 mg | ORAL_TABLET | Freq: Four times a day (QID) | ORAL | Status: DC | PRN

## 2019-10-03 MED ORDER — CLONIDINE HCL 0.3 MG/24HR TD PTWK: 0.3000 mg | MEDICATED_PATCH | TRANSDERMAL | Status: DC

## 2019-10-03 MED ORDER — METRONIDAZOLE IN NACL 5-0.79 MG/ML-% IV SOLN
500.0000 mg | Freq: Once | INTRAVENOUS | Status: AC
  Administered 2019-10-03: 500 mg via INTRAVENOUS
  Filled 2019-10-03 (×2): qty 100

## 2019-10-03 MED ORDER — DILTIAZEM LOAD VIA INFUSION
10.0000 mg | Freq: Once | INTRAVENOUS | Status: DC
  Filled 2019-10-03: qty 10

## 2019-10-03 MED ORDER — INSULIN ASPART 100 UNIT/ML ~~LOC~~ SOLN
0.0000 [IU] | Freq: Every day | SUBCUTANEOUS | Status: DC
  Administered 2019-10-03: 5 [IU] via SUBCUTANEOUS
  Administered 2019-10-04: 4 [IU] via SUBCUTANEOUS
  Administered 2019-10-05: 2 [IU] via SUBCUTANEOUS
  Filled 2019-10-03 (×2): qty 1

## 2019-10-03 MED ORDER — BUPRENORPHINE HCL-NALOXONE HCL 8-2 MG SL SUBL
1.0000 | SUBLINGUAL_TABLET | Freq: Three times a day (TID) | SUBLINGUAL | Status: DC
  Administered 2019-10-03 – 2019-10-06 (×10): 1 via SUBLINGUAL
  Filled 2019-10-03 (×10): qty 1

## 2019-10-03 MED ORDER — CLONIDINE HCL 0.1 MG PO TABS
0.2000 mg | ORAL_TABLET | Freq: Three times a day (TID) | ORAL | Status: DC
  Administered 2019-10-03 – 2019-10-06 (×7): 0.2 mg via ORAL
  Filled 2019-10-03 (×9): qty 2

## 2019-10-03 MED ORDER — SODIUM CHLORIDE 0.9 % IV SOLN: INTRAVENOUS | Status: DC

## 2019-10-03 MED ORDER — AMLODIPINE BESYLATE 5 MG PO TABS
5.0000 mg | ORAL_TABLET | Freq: Every day | ORAL | Status: DC
  Administered 2019-10-05 – 2019-10-06 (×2): 5 mg via ORAL
  Filled 2019-10-03 (×3): qty 1

## 2019-10-03 MED ORDER — ONDANSETRON HCL 4 MG/2ML IJ SOLN: 4.0000 mg | Freq: Four times a day (QID) | INTRAMUSCULAR | Status: DC | PRN

## 2019-10-03 NOTE — ED Provider Notes (Signed)
[facility] Emergency Department Provider Note  ____________________________________________  Time seen: Approximately 10:32 AM  I have reviewed the triage vital signs and the nursing notes.   HISTORY  Chief Complaint Fall    Level 5 Caveat: Portions of the History and Physical including HPI and review of systems are unable to be completely obtained due to patient being a poor historian   HPI Joshua Gordon is a 64 y.o. male with a history of diabetes, hypertension, long-term opioid pain management , chronically wheelchair-bound who notes that 3 days ago he fell out of his wheelchair and was too weak to get up off the floor.  He denies any focal pain or lateralizing weakness.  Denies head injury or loss of consciousness.  Reports he has not eaten in 3 days and was found this morning by medical transport service that was going to take him to his chronic pain/Suboxone clinic.  Denies chest pain or shortness of breath.  Weakness is gradual onset, no aggravating or alleviating factors.  Has not taken his medications.     Past Medical History:  Diagnosis Date  . Chronic pain   . Diabetes mellitus without complication (HCC)   . Hypertension   . Non compliance with medical treatment   . Pain management    contract for pain management  . Polysubstance abuse (HCC)   . Renal disorder      There are no problems to display for this patient.    Past Surgical History:  Procedure Laterality Date  . FEMUR FRACTURE SURGERY Left   . KNEE SURGERY Left 1991   5 times  . REPLACEMENT TOTAL KNEE Right    twice     Prior to Admission medications   Medication Sig Start Date End Date Taking? Authorizing Provider  ALPRAZolam Prudy Feeler) 1 MG tablet Take 1 mg by mouth 2 (two) times daily.  03/24/13   [provider]  amphetamine-dextroamphetamine (ADDERALL XR) 30 MG 24 hr capsule Take 30 mg by mouth daily. 12/23/15   [provider]  citalopram  (CELEXA) 40 MG tablet Take 40 mg by mouth daily. 12/21/15   [provider]  cloNIDine (CATAPRES - DOSED IN MG/24 HR) 0.3 mg/24hr patch Place 0.3 mg onto the skin once a week. 01/18/16   [provider]  cloNIDine (CATAPRES - DOSED IN MG/24 HR) 0.3 mg/24hr patch Place 1 patch (0.3 mg total) onto the skin every 7 (seven) days. 11/18/17 11/18/18  Emily Filbert, MD  gabapentin (NEURONTIN) 600 MG tablet Take 600 mg by mouth 4 (four) times daily. 12/21/15   [provider]  ibuprofen (ADVIL,MOTRIN) 800 MG tablet Take 800 mg by mouth 3 (three) times daily. 12/21/15   [provider]  JANUVIA 100 MG tablet Take 100 mg by mouth daily. 12/21/15   [provider]  LYRICA 150 MG capsule Take 1 capsule (150 mg total) by mouth 2 (two) times daily. 11/18/17   Emily Filbert, MD  metFORMIN (GLUCOPHAGE) 1000 MG tablet Take 1 tablet (1,000 mg total) by mouth 2 (two) times daily. 11/18/17   Emily Filbert, MD  potassium chloride (K-DUR) 10 MEQ tablet Take 2 tablets (20 mEq total) by mouth daily. 03/23/17   Jeanmarie Plant, MD  SUBOXONE 8-2 MG FILM Place 2 mg under the tongue 3 (three) times daily. 01/18/16   [provider]  zolpidem (AMBIEN) 10 MG tablet Take 10 mg by mouth at bedtime as needed for sleep.  04/04/13  [provider]     Allergies Patient has no known allergies.   History reviewed. No pertinent family history.  Social History Social History   Tobacco Use  . Smoking status: Current Every Day Smoker    Packs/day: 0.50    Types: Cigarettes  . Smokeless tobacco: Never Used  Substance Use Topics  . Alcohol use: No  . Drug use: Yes    Types: Cocaine    Comment: opiates    Review of Systems Level 5 Caveat: Portions of the History and Physical including HPI and review of systems are unable to be completely obtained due to patient being a poor historian   Constitutional:   No known fever.  ENT:   No  rhinorrhea. Cardiovascular:   No chest pain or syncope. Respiratory:   No dyspnea or cough. Gastrointestinal:   Negative for abdominal pain, vomiting and diarrhea.  Musculoskeletal:   Negative for focal pain or swelling ____________________________________________   PHYSICAL EXAM:  VITAL SIGNS: ED Triage Vitals  Enc Vitals Group     BP 10/03/19 1018 (!) 171/107     Pulse Rate 10/03/19 1018 (!) 112     Resp 10/03/19 1018 19     Temp 10/03/19 1018 (!) 100.5 F (38.1 C)     Temp Source 10/03/19 1018 Oral     SpO2 10/03/19 1018 92 %     Weight 10/03/19 1020 210 lb 5.1 oz (95.4 kg)     Height 10/03/19 1020 6\' 1"  (1.854 m)     Head Circumference --      Peak Flow --      Pain Score 10/03/19 1019 6     Pain Loc --      Pain Edu? --      Excl. in [place]? --     Vital signs reviewed, nursing assessments reviewed.   Constitutional:   Alert and oriented.  Ill-appearing. Eyes:   Conjunctivae are normal. EOMI. PERRL. ENT      Head:   Normocephalic and atraumatic.      Nose:   No congestion/rhinnorhea.       Mouth/Throat:   MMM, no pharyngeal erythema. No peritonsillar mass.       Neck:   No meningismus. Full ROM. Hematological/Lymphatic/Immunilogical:   No cervical lymphadenopathy. Cardiovascular:   Tachycardia heart rate 110. Symmetric bilateral radial and DP pulses.  No murmurs. Cap refill less than 2 seconds. Respiratory:   Normal respiratory effort without tachypnea/retractions. Breath sounds are clear and equal bilaterally. No wheezes/rales/rhonchi. Gastrointestinal:   Soft and nontender. Non distended. There is no CVA tenderness.  No rebound, rigidity, or guarding. Genitourinary:   Normal, uncircumcised male Musculoskeletal:   Normal range of motion in upper extremities. No joint effusions.  No lower extremity tenderness.  Lymphedema and brawny skin thickening of bilateral lower extremities consistent with venous stasis disease.  Multiple callused wounds of the left foot with  chronic inversion deformity.  Patient reports that used to be in a cast which she removed.  No midline spinal tenderness Neurologic:   Normal speech and language.  Motor grossly intact in upper extremities, too weak to move lower extremities bilaterally.  Skin:    Skin is warm, dry with foot wound as above. No rash noted.  No petechiae, purpura, or bullae.  ____________________________________________    LABS (pertinent positives/negatives) (all labs ordered are listed, but only abnormal results are displayed) Labs Reviewed  COMPREHENSIVE METABOLIC PANEL - Abnormal; Notable for the following components:  Result Value   Sodium 134 (*)    Glucose, Bld 253 (*)    BUN 7 (*)    Total Protein 8.3 (*)    AST 92 (*)    All other components within normal limits  CBC WITH DIFFERENTIAL/PLATELET - Abnormal; Notable for the following components:   WBC 11.6 (*)    Platelets 142 (*)    Neutro Abs 9.5 (*)    All other components within normal limits  URINALYSIS, COMPLETE (UACMP) WITH MICROSCOPIC - Abnormal; Notable for the following components:   Color, Urine YELLOW (*)    APPearance CLEAR (*)    Glucose, UA >=500 (*)    Hgb urine dipstick MODERATE (*)    Protein, ur 100 (*)    All other components within normal limits  CK - Abnormal; Notable for the following components:   Total CK 5,771 (*)    All other components within normal limits  CULTURE, BLOOD (ROUTINE X 2)  CULTURE, BLOOD (ROUTINE X 2)  URINE CULTURE  RESPIRATORY PANEL BY RT PCR (FLU A&B, COVID)  LACTIC ACID, PLASMA  APTT  PROTIME-INR  LACTIC ACID, PLASMA  PROCALCITONIN  BLOOD GAS, VENOUS  POC SARS CORONAVIRUS 2 AG -  ED   ____________________________________________   EKG  Interpreted by me Sinus tachycardia rate 100, right axis, normal intervals.  Normal QRS ST segments and T waves.  3 PVCs on the strip.  ____________________________________________    RADIOLOGY  CT HEAD WO CONTRAST  Result Date:  10/03/2019 CLINICAL DATA:  Larey Seat. EXAM: CT HEAD WITHOUT CONTRAST TECHNIQUE: Contiguous axial images were obtained from the base of the skull through the vertex without intravenous contrast. COMPARISON:  04/05/2013 FINDINGS: Brain: Progressive cerebral atrophy, ventriculomegaly and periventricular white matter disease. No extra-axial fluid collections are identified. No CT findings for acute hemispheric infarction or intracranial hemorrhage. No mass lesions. The brainstem and cerebellum are normal. Vascular: Vascular calcifications but no aneurysm or hyperdense vessels. Skull: No skull fracture or bone lesions. Sinuses/Orbits: The paranasal sinuses and mastoid air cells are clear. The globes are intact. Other: No scalp lesions or scalp hematoma. IMPRESSION: 1. Progressive cerebral atrophy, ventriculomegaly and periventricular white matter disease. 2. No acute intracranial findings or skull fracture. Electronically Signed   By: Rudie Meyer M.D.   On: 10/03/2019 11:53   DG Chest Port 1 View  Result Date: 10/03/2019 CLINICAL DATA:  Fever, confusion, weakness EXAM: PORTABLE CHEST 1 VIEW COMPARISON:  Radiograph 04/02/2013 FINDINGS: Normal mediastinum and cardiac silhouette. Chronic central bronchitic markings. Normal pulmonary vasculature. No effusion, infiltrate, or pneumothorax. Rounded calcific density projects over the body of the RIGHT scapula. IMPRESSION: No acute cardiopulmonary process. Rounded calcific density projecting over the RIGHT scapula is favored represent a loose body associated with the glenohumeral joint versus a benign bone lesion of the scapula. Electronically Signed   By: Genevive Bi M.D.   On: 10/03/2019 12:17    ____________________________________________   PROCEDURES .Critical Care Performed by: Sharman Cheek, MD Authorized by: Sharman Cheek, MD   Critical care provider statement:    Critical care time (minutes):  35   Critical care time was exclusive of:   Separately billable procedures and treating other patients   Critical care was necessary to treat or prevent imminent or life-threatening deterioration of the following conditions:  Sepsis   Critical care was time spent personally by me on the following activities:  Development of treatment plan with patient or surrogate, discussions with consultants, evaluation of patient's response to treatment, examination  of patient, obtaining history from patient or surrogate, ordering and performing treatments and interventions, ordering and review of laboratory studies, ordering and review of radiographic studies, pulse oximetry, re-evaluation of patient's condition and review of old charts Comments:          ____________________________________________  DIFFERENTIAL DIAGNOSIS   Intracranial hemorrhage, sepsis, dehydration, electrolyte abnormality, UTI, pneumonia, rhabdomyolysis, Covid  CLINICAL IMPRESSION / ASSESSMENT AND PLAN / ED COURSE  Medications ordered in the ED: Medications  metroNIDAZOLE (FLAGYL) IVPB 500 mg (500 mg Intravenous New Bag/Given 10/03/19 1155)  vancomycin (VANCOCIN) IVPB 1000 mg/200 mL premix (1,000 mg Intravenous New Bag/Given 10/03/19 1156)  ceFEPIme (MAXIPIME) 2 g in sodium chloride 0.9 % 100 mL IVPB (0 g Intravenous Stopped 10/03/19 1148)  sodium chloride 0.9 % bolus 1,000 mL (0 mLs Intravenous Stopped 10/03/19 1149)    Pertinent labs & imaging results that were available during my care of the patient were reviewed by me and considered in my medical decision making (see chart for details).   Joshua Gordon was evaluated in Emergency Department on 10/03/2019 for the symptoms described in the history of present illness. He was evaluated in the context of the global COVID-19 pandemic, which necessitated consideration that the patient might be at risk for infection with the SARS-CoV-2 virus that causes COVID-19. Institutional protocols and algorithms that pertain to the  evaluation of patients at risk for COVID-19 are in a state of rapid change based on information released by regulatory bodies including the CDC and federal and state organizations. These policies and algorithms were followed during the patient's care in the ED.     Clinical Course as of Oct 03 1230  Tue Oct 03, 2019  1024 Patient presents with generalized weakness, fever, tachycardia.  Exam is nonfocal, but there is concern for sepsis.  Code sepsis initiated, will check cultures, urinalysis chest x-ray, give IV fluids 1 L bolus for now.  Empiric antibiotics with cefepime vancomycin and Flagyl.  Will need CT scan of the head as well due to his comorbidities including hypertension, diabetes, chronic disability wheelchair-bound with fall.   [PS]  1110 Lactate normal. No signs of shock.   [PS]  1128 CT head images viewed by me, no obvious ICH.   [PS]  1152 Chest x-ray image viewed by me, no pneumothorax or pulmonary edema, no evidence of pleural effusion, no focal infiltrate.   [PS]    Clinical Course User Index [PS] Sharman Cheek, MD     ----------------------------------------- 12:32 PM on 10/03/2019 -----------------------------------------  Urinalysis unremarkable. will Obtain x-ray of the left foot.  Case discussed with the hospitalist.  ____________________________________________   FINAL CLINICAL IMPRESSION(S) / ED DIAGNOSES    Final diagnoses:  Generalized weakness  Traumatic rhabdomyolysis, initial encounter (HCC)  Dehydration  Sepsis, due to unspecified organism, unspecified whether acute organ dysfunction present [facility])     ED Discharge Orders    None      Portions of this note were generated with dragon dictation software. Dictation errors may occur despite best attempts at proofreading.   Sharman Cheek, MD 10/03/19 1233

## 2019-10-03 NOTE — ED Notes (Signed)
Attempted to obtain second set of blood cultures. Was unable to. Asked Tammy Sours RN to [place] IV for second set of cultures.

## 2019-10-03 NOTE — ED Notes (Signed)
Ready bed @ 1553, patient going to room 145

## 2019-10-03 NOTE — ED Notes (Signed)
Ready bed @ 1608, patient now going to bed 120, spoke with [employer].

## 2019-10-03 NOTE — H&P (Signed)
History and Physical    Joshua Gordon HLK:[id number] DOB: 03/09/1956 DOA: 10/03/2019  PCP: Pearson Grippe, MD   Patient coming from: Home  I have personally briefly reviewed patient's old medical records in [facility]  Chief Complaint: Fall  HPI: Joshua Gordon is a 64 y.o. male with medical history significant for diabetes mellitus, hypertension, chronically wheelchair-bound, long-term opiate management who presents to the emergency room after he fell out of his wheelchair 3 days prior to his admission thank you thank you is a sweet thank you this is going to respiratory good thank you and was too weak to get up off the floor.  He denies having any focal pain or lateralizing weakness.  Denies having any loss of consciousness.  Per patient he has not had anything to eat in 3 days and was found this morning by medical transport service that takes him to his chronic pain clinic.  He denies having any chest pain, shortness of breath, abdominal pain, fever, chills, dizziness or lightheadedness Upon arrival to the emergency room he had a low-grade temp with a T-max of 100.5 and he was tachycardic at 112.  Lactic acid was 1.4. Patient had a left foot x-ray which showed no acute osseous abnormality. Soft tissue ulceration overlies the lateral foot at the level of the fifth metatarsal base. 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 Chest x-ray showed no acute findings and urinalysis was sterile Labs revealed an elevated total CK level greater than 5000  ED Course:   Patient presents with generalized weakness, fever, tachycardia.  Exam is nonfocal, but there is concern for sepsis.  Code sepsis initiated.  Patient received sepsis IV fluid bolus and empiric antibiotics with cefepime vancomycin and Flagyl.       Review of Systems: As per HPI otherwise 10 point review of systems negative.     Past Medical History:  Diagnosis Date  . Chronic pain   . Diabetes  mellitus without complication (HCC)   . Hypertension   . Non compliance with medical treatment   . Pain management    contract for pain management  . Polysubstance abuse (HCC)   . Renal disorder     Past Surgical History:  Procedure Laterality Date  . FEMUR FRACTURE SURGERY Left   . KNEE SURGERY Left 1991   5 times  . REPLACEMENT TOTAL KNEE Right    twice     reports that he has been smoking cigarettes. He has been smoking about 0.50 packs per day. He has never used smokeless tobacco. He reports current drug use. Drug: Cocaine. He reports that he does not drink alcohol.  No Known Allergies  History reviewed. No pertinent family history.   Prior to Admission medications   Medication Sig Start Date End Date Taking? Authorizing Provider  ALPRAZolam Prudy Feeler) 1 MG tablet Take 1 mg by mouth 2 (two) times daily.  03/24/13   [provider]  amphetamine-dextroamphetamine (ADDERALL XR) 30 MG 24 hr capsule Take 30 mg by mouth daily. 12/23/15   [provider]  citalopram (CELEXA) 40 MG tablet Take 40 mg by mouth daily. 12/21/15   [provider]  cloNIDine (CATAPRES - DOSED IN MG/24 HR) 0.3 mg/24hr patch Place 0.3 mg onto the skin once a week. 01/18/16   [provider]  cloNIDine (CATAPRES - DOSED IN MG/24 HR) 0.3 mg/24hr patch Place 1 patch (0.3 mg total) onto the skin every 7 (seven) days. 11/18/17 11/18/18  Emily Filbert,  MD  gabapentin (NEURONTIN) 600 MG tablet Take 600 mg by mouth 4 (four) times daily. 12/21/15   [provider]  ibuprofen (ADVIL,MOTRIN) 800 MG tablet Take 800 mg by mouth 3 (three) times daily. 12/21/15   [provider]  JANUVIA 100 MG tablet Take 100 mg by mouth daily. 12/21/15   [provider]  LYRICA 150 MG capsule Take 1 capsule (150 mg total) by mouth 2 (two) times daily. 11/18/17   Emily Filbert, MD  metFORMIN (GLUCOPHAGE) 1000 MG tablet Take 1 tablet (1,000 mg total) by mouth 2 (two) times daily.  11/18/17   Emily Filbert, MD  potassium chloride (K-DUR) 10 MEQ tablet Take 2 tablets (20 mEq total) by mouth daily. 03/23/17   Jeanmarie Plant, MD  SUBOXONE 8-2 MG FILM Place 2 mg under the tongue 3 (three) times daily. 01/18/16   [provider]  zolpidem (AMBIEN) 10 MG tablet Take 10 mg by mouth at bedtime as needed for sleep.  04/04/13   [provider]    Physical Exam: Vitals:   10/03/19 1200 10/03/19 1230 10/03/19 1330 10/03/19 1430  BP: (!) 142/90 (!) 143/112 (!) 156/93 (!) 173/81  Pulse: 90 79  87  Resp: 15 14 14 14   Temp:      TempSrc:      SpO2: 100% 100%  97%  Weight:      Height:         Vitals:   10/03/19 1200 10/03/19 1230 10/03/19 1330 10/03/19 1430  BP: (!) 142/90 (!) 143/112 (!) 156/93 (!) 173/81  Pulse: 90 79  87  Resp: 15 14 14 14   Temp:      TempSrc:      SpO2: 100% 100%  97%  Weight:      Height:        Constitutional: NAD, alert and oriented x 3.  Chronically ill-appearing Eyes: PERRL, lids and conjunctivae normal, pale conjunctiva ENMT: Mucous membranes are moist.  Neck: normal, supple, no masses, no thyromegaly Respiratory: clear to auscultation bilaterally, no wheezing, no crackles. Normal respiratory effort. No accessory muscle use.  Cardiovascular: Tachycardic, no murmurs / rubs / gallops. No extremity edema. 2+ pedal pulses. No carotid bruits.  Abdomen: no tenderness, no masses palpated. No hepatosplenomegaly. Bowel sounds positive.  Musculoskeletal: no clubbing / cyanosis.  Contracture of left foot, both lower extremities are warm to touch Skin: no rashes, lesions, chronic ulceration involving the left foot with some open wounds.  Neurologic: No gross focal neurologic deficit. Psychiatric: Flat affect.   Labs on Admission: I have personally reviewed following labs and imaging studies  CBC: Recent Labs  Lab 10/03/19 1036  WBC 11.6*  NEUTROABS 9.5*  HGB 14.1  HCT 43.9  MCV 82.4  PLT 142*   Basic Metabolic  Panel: Recent Labs  Lab 10/03/19 1036  NA 134*  K 3.6  CL 99  CO2 24  GLUCOSE 253*  BUN 7*  CREATININE 0.79  CALCIUM 9.2   GFR: Estimated Creatinine Clearance: 109.6 mL/min (by C-G formula based on SCr of 0.79 mg/dL). Liver Function Tests: Recent Labs  Lab 10/03/19 1036  AST 92*  ALT 26  ALKPHOS 80  BILITOT 1.2  PROT 8.3*  ALBUMIN 3.9   No results for input(s): LIPASE, AMYLASE in the last 168 hours. No results for input(s): AMMONIA in the last 168 hours. Coagulation Profile: Recent Labs  Lab 10/03/19 1036  INR 1.2   Cardiac Enzymes: Recent Labs  Lab 10/03/19 1036  CKTOTAL 5,771*   BNP (last 3 results) No results for input(s): PROBNP in the last 8760 hours. HbA1C: No results for input(s): HGBA1C in the last 72 hours. CBG: No results for input(s): GLUCAP in the last 168 hours. Lipid Profile: No results for input(s): CHOL, HDL, LDLCALC, TRIG, CHOLHDL, LDLDIRECT in the last 72 hours. Thyroid Function Tests: No results for input(s): TSH, T4TOTAL, FREET4, T3FREE, THYROIDAB in the last 72 hours. Anemia Panel: No results for input(s): VITAMINB12, FOLATE, FERRITIN, TIBC, IRON, RETICCTPCT in the last 72 hours. Urine analysis:    Component Value Date/Time   COLORURINE YELLOW (A) 10/03/2019 1159   APPEARANCEUR CLEAR (A) 10/03/2019 1159   APPEARANCEUR Clear 07/16/2014 1538   LABSPEC 1.021 10/03/2019 1159   LABSPEC 1.028 07/16/2014 1538   PHURINE 5.0 10/03/2019 1159   GLUCOSEU >=500 (A) 10/03/2019 1159   GLUCOSEU >=500 07/16/2014 1538   HGBUR MODERATE (A) 10/03/2019 1159   BILIRUBINUR NEGATIVE 10/03/2019 1159   BILIRUBINUR Negative 07/16/2014 1538   KETONESUR NEGATIVE 10/03/2019 1159   PROTEINUR 100 (A) 10/03/2019 1159   UROBILINOGEN 4.0 (H) 03/17/2013 2349   NITRITE NEGATIVE 10/03/2019 1159   LEUKOCYTESUR NEGATIVE 10/03/2019 1159   LEUKOCYTESUR Negative 07/16/2014 1538    Radiological Exams on Admission: CT HEAD WO CONTRAST  Result Date:  10/03/2019 CLINICAL DATA:  Larey Seat. EXAM: CT HEAD WITHOUT CONTRAST TECHNIQUE: Contiguous axial images were obtained from the base of the skull through the vertex without intravenous contrast. COMPARISON:  04/05/2013 FINDINGS: Brain: Progressive cerebral atrophy, ventriculomegaly and periventricular white matter disease. No extra-axial fluid collections are identified. No CT findings for acute hemispheric infarction or intracranial hemorrhage. No mass lesions. The brainstem and cerebellum are normal. Vascular: Vascular calcifications but no aneurysm or hyperdense vessels. Skull: No skull fracture or bone lesions. Sinuses/Orbits: The paranasal sinuses and mastoid air cells are clear. The globes are intact. Other: No scalp lesions or scalp hematoma. IMPRESSION: 1. Progressive cerebral atrophy, ventriculomegaly and periventricular white matter disease. 2. No acute intracranial findings or skull fracture. Electronically Signed   By: Rudie Meyer M.D.   On: 10/03/2019 11:53   DG Chest Port 1 View  Result Date: 10/03/2019 CLINICAL DATA:  Fever, confusion, weakness EXAM: PORTABLE CHEST 1 VIEW COMPARISON:  Radiograph 04/02/2013 FINDINGS: Normal mediastinum and cardiac silhouette. Chronic central bronchitic markings. Normal pulmonary vasculature. No effusion, infiltrate, or pneumothorax. Rounded calcific density projects over the body of the RIGHT scapula. IMPRESSION: No acute cardiopulmonary process. Rounded calcific density projecting over the RIGHT scapula is favored represent a loose body associated with the glenohumeral joint versus a benign bone lesion of the scapula. Electronically Signed   By: Genevive Bi M.D.   On: 10/03/2019 12:17   DG Foot 2 Views Left  Result Date: 10/03/2019 CLINICAL DATA:  Left foot swelling EXAM: LEFT FOOT - 2 VIEW COMPARISON:  11/18/2015 FINDINGS: Soft tissue ulceration overlies the lateral foot at the level of the fifth metatarsal base. There is mild diffuse soft tissue swelling.  No soft tissue gas. No acute fracture identified. No malalignment. Degenerative changes including a subchondral cyst within the central aspect of the first metatarsal head. 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 IMPRESSION: 1. No acute osseous abnormality. 2. Soft tissue ulceration overlies the lateral foot at the level of the fifth metatarsal base. 3. 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 Electronically Signed   By: Duanne Guess D.O.   On: 10/03/2019 13:19    EKG: Independently  reviewed.  Sinus tachycardia PVCs  Assessment/Plan Principal Problem:   Rhabdomyolysis Active Problems:   Hypertension   Diabetes mellitus without complication (HCC)   Chronic pain   Cellulitis    Rhabdomyolysis Status post fall Noted to have total CK levels greater than 5000 Aggressive IV fluid hydration Repeat CK levels in a.m.   Status post fall Fall precautions PT evaluation   Bilateral lower extremity cellulitis Patient was tachycardic and had a low-grade fever in the emergency room with a T-max of 100.13F Patient has chronic lower extremity wounds and both legs have differential warmth.  Will start patient empirically on Ancef 1g IV q 8 Follow-up results of blood cultures Lactic acid is 1.4   Diabetes mellitus We will place patient on consistent carbohydrate diet Glycemic control with sliding scale coverage  DVT prophylaxis: Lovenox Code Status: Full  Family Communication: Greater than 50% of time was spent discussing patient's condition and plan of care with him at bedside.  All questions and concerns were addressed Disposition Plan: Back to previous home environment Consults called: Wound Care    Austyn Seier MD [facility]     10/03/2019, 4:15 PM

## 2019-10-03 NOTE — ED Notes (Signed)
Pt dropped 02 sats to 85% on RA. Placed on 2 L [place] and came up to 94%.

## 2019-10-03 NOTE — ED Notes (Signed)
First tachy episode since cardizem drip started. HR in the 164-174 range, lasting approx 30-45 seconds.   NP contacted regarding tachy episodes, for any additional orders. None received at this time  (Pt has had several sinus tach episodes lasting for approx 1-1.5 minutes, HR 160's-170's. Admitting MD was notified, orders received and carried out.)

## 2019-10-03 NOTE — ED Notes (Signed)
Pt taken to CT via stretcher.

## 2019-10-03 NOTE — ED Notes (Signed)
ED TO INPATIENT HANDOFF REPORT  ED Nurse Name and Phone #: [phone]  S Name/Age/Gender Joshua Gordon 64 y.o. male Room/Bed: ED08A/ED08A  Code Status   Code Status: Full Code  Home/SNF/Other Home Patient oriented to: self Is this baseline? Yes   Triage Complete: Triage complete  Chief Complaint Rhabdomyolysis [M62.82]  Triage Note Pt arrives [facility] from home. Pt was found on floor by transport driver (pt was due to be taken to [facility] for suboxone). Pt states in floor 3-4 days. Wife in hospital and pt is wheelchair bound. Ulcers noted to feet. Hasn't taken meds since being in floor. VS with EMS: CBG 302, 98.9 oral, HR 120, 95% RA, 44 CO2, RR 24, 162/100. Pt arrives A&O, has to be waken up to answer questions but answers correctly. Pt falls asleep after answering one question.     Allergies No Known Allergies  Level of Care/Admitting Diagnosis ED Disposition    ED Disposition Condition Comment   Admit  Hospital Area: [facility] [[id number]]  Level of Care: Med-Surg [16]  Covid Evaluation: Asymptomatic Screening Protocol (No Symptoms)  Diagnosis: Rhabdomyolysis [728.88.ICD-9-CM]  Admitting Physician: Lonia MadAGBATA, TOCHUKWU [[id number]]  Attending Physician: Lonia MadAGBATA, TOCHUKWU [[id number]]  Estimated length of stay: 3 - 4 days  Certification:: I certify this patient will need inpatient services for at least 2 midnights  Bed request comments: With telemetry       B Medical/Surgery History Past Medical History:  Diagnosis Date  . Chronic pain   . Diabetes mellitus without complication (HCC)   . Hypertension   . Non compliance with medical treatment   . Pain management    contract for pain management  . Polysubstance abuse (HCC)   . Renal disorder    Past Surgical History:  Procedure Laterality Date  . FEMUR FRACTURE SURGERY Left   . KNEE SURGERY Left 1991   5 times  . REPLACEMENT TOTAL KNEE Right    twice     A IV Location/Drains/Wounds Patient  Lines/Drains/Airways Status   Active Line/Drains/Airways    Name:   Placement date:   Placement time:   Site:   Days:   Peripheral IV 10/03/19 Right Hand   10/03/19    1040    Hand   less than 1   Peripheral IV 10/03/19 Left;Upper Arm   10/03/19    1112    Arm   less than 1          Intake/Output Last 24 hours No intake or output data in the 24 hours ending 10/03/19 1615  Labs/Imaging Results for orders placed or performed during the hospital encounter of 10/03/19 (from the past 48 hour(s))  Blood gas, venous ([facility], [facility], [facility])     Status: Abnormal (Preliminary result)   Collection Time: 10/03/19 10:22 AM  Result Value Ref Range   pH, Ven 7.38 7.250 - 7.430   pCO2, Ven 60 44.0 - 60.0 mmHg   pO2, Ven PENDING 32.0 - 45.0 mmHg   Bicarbonate 35.5 (H) 20.0 - 28.0 mmol/L   Acid-Base Excess 8.2 (H) 0.0 - 2.0 mmol/L   O2 Saturation 35.9 %   Patient temperature 37.0    Collection site VENOUS    Sample type VENOUS     Comment: Performed at [facility], [address] [place], [place] [address]  Lactic acid, plasma     Status: None   Collection Time: 10/03/19 10:36 AM  Result Value Ref Range   Lactic Acid, Venous 1.4 0.5 -  1.9 mmol/L    Comment: Performed at [facility], [address] [place], [place] [address]  Comprehensive metabolic panel     Status: Abnormal   Collection Time: 10/03/19 10:36 AM  Result Value Ref Range   Sodium 134 (L) 135 - 145 mmol/L   Potassium 3.6 3.5 - 5.1 mmol/L   Chloride 99 98 - 111 mmol/L   CO2 24 22 - 32 mmol/L   Glucose, Bld 253 (H) 70 - 99 mg/dL    Comment: Glucose reference range applies only to samples taken after fasting for at least 8 hours.   BUN 7 (L) 8 - 23 mg/dL   Creatinine, Ser 0.79 0.61 - 1.24 mg/dL   Calcium 9.2 8.9 - 10.3 mg/dL   Total Protein 8.3 (H) 6.5 - 8.1 g/dL   Albumin 3.9 3.5 - 5.0 g/dL   AST 92 (H) 15 - 41 U/L   ALT 26 0 - 44 U/L   Alkaline Phosphatase 80 38 - 126 U/L   Total Bilirubin 1.2 0.3 - 1.2  mg/dL   GFR calc non Af Amer >60 >60 mL/min   GFR calc Af Amer >60 >60 mL/min   Anion gap 11 5 - 15    Comment: Performed at [facility], [address] [place], [place] [address]  CBC WITH DIFFERENTIAL     Status: Abnormal   Collection Time: 10/03/19 10:36 AM  Result Value Ref Range   WBC 11.6 (H) 4.0 - 10.5 K/uL   RBC 5.33 4.22 - 5.81 MIL/uL   Hemoglobin 14.1 13.0 - 17.0 g/dL   HCT 43.9 39.0 - 52.0 %   MCV 82.4 80.0 - 100.0 fL   MCH 26.5 26.0 - 34.0 pg   MCHC 32.1 30.0 - 36.0 g/dL   RDW 13.4 11.5 - 15.5 %   Platelets 142 (L) 150 - 400 K/uL   nRBC 0.0 0.0 - 0.2 %   Neutrophils Relative % 81 %   Neutro Abs 9.5 (H) 1.7 - 7.7 K/uL   Lymphocytes Relative 11 %   Lymphs Abs 1.2 0.7 - 4.0 K/uL   Monocytes Relative 7 %   Monocytes Absolute 0.8 0.1 - 1.0 K/uL   Eosinophils Relative 0 %   Eosinophils Absolute 0.0 0.0 - 0.5 K/uL   Basophils Relative 0 %   Basophils Absolute 0.0 0.0 - 0.1 K/uL   Immature Granulocytes 1 %   Abs Immature Granulocytes 0.06 0.00 - 0.07 K/uL    Comment: Performed at [facility], [address] [place], [place] [address]  APTT     Status: None   Collection Time: 10/03/19 10:36 AM  Result Value Ref Range   aPTT 35 24 - 36 seconds    Comment: Performed at [facility], [address] [place], [place] [address]  Protime-INR     Status: None   Collection Time: 10/03/19 10:36 AM  Result Value Ref Range   Prothrombin Time 14.8 11.4 - 15.2 seconds   INR 1.2 0.8 - 1.2    Comment: (NOTE) INR goal varies based on device and disease states. Performed at [facility], [address] [place], [place] [address]   Procalcitonin     Status: None   Collection Time: 10/03/19 10:36 AM  Result Value Ref Range   Procalcitonin <0.10 ng/mL    Comment:        Interpretation: PCT (Procalcitonin) <= 0.5 ng/mL: Systemic infection (sepsis) is not likely. Local bacterial infection is possible. (NOTE)  Sepsis PCT Algorithm            Lower Respiratory Tract                                      Infection PCT Algorithm    ----------------------------     ----------------------------         PCT < 0.25 ng/mL                PCT < 0.10 ng/mL         Strongly encourage             Strongly discourage   discontinuation of antibiotics    initiation of antibiotics    ----------------------------     -----------------------------       PCT 0.25 - 0.50 ng/mL            PCT 0.10 - 0.25 ng/mL               OR       >80% decrease in PCT            Discourage initiation of                                            antibiotics      Encourage discontinuation           of antibiotics    ----------------------------     -----------------------------         PCT >= 0.50 ng/mL              PCT 0.26 - 0.50 ng/mL               AND        <80% decrease in PCT             Encourage initiation of                                             antibiotics       Encourage continuation           of antibiotics    ----------------------------     -----------------------------        PCT >= 0.50 ng/mL                  PCT > 0.50 ng/mL               AND         increase in PCT                  Strongly encourage                                      initiation of antibiotics    Strongly encourage escalation           of antibiotics                                     -----------------------------  PCT <= 0.25 ng/mL                                                 OR                                        > 80% decrease in PCT                                     Discontinue / Do not initiate                                             antibiotics Performed at [facility], [address] [place], [place] [address]   CK     Status: Abnormal   Collection Time: 10/03/19 10:36 AM  Result Value Ref Range   Total CK 5,771 (H) 49 - 397 U/L    Comment: RESULT CONFIRMED BY MANUAL  DILUTION Performed at [facility], [address] [place], [place] [address]   Lactic acid, plasma     Status: None   Collection Time: 10/03/19 11:59 AM  Result Value Ref Range   Lactic Acid, Venous 1.7 0.5 - 1.9 mmol/L    Comment: Performed at [facility], [address] [place], [place] [address]  Urinalysis, Complete w Microscopic     Status: Abnormal   Collection Time: 10/03/19 11:59 AM  Result Value Ref Range   Color, Urine YELLOW (A) YELLOW   APPearance CLEAR (A) CLEAR   Specific Gravity, Urine 1.021 1.005 - 1.030   pH 5.0 5.0 - 8.0   Glucose, UA >=500 (A) NEGATIVE mg/dL   Hgb urine dipstick MODERATE (A) NEGATIVE   Bilirubin Urine NEGATIVE NEGATIVE   Ketones, ur NEGATIVE NEGATIVE mg/dL   Protein, ur 194 (A) NEGATIVE mg/dL   Nitrite NEGATIVE NEGATIVE   Leukocytes,Ua NEGATIVE NEGATIVE   RBC / HPF 0-5 0 - 5 RBC/hpf   WBC, UA 0-5 0 - 5 WBC/hpf   Bacteria, UA NONE SEEN NONE SEEN   Squamous Epithelial / LPF NONE SEEN 0 - 5   Mucus PRESENT     Comment: Performed at [facility], [address] [place], [place] [address]  Respiratory Panel by RT PCR (Flu A&B, Covid) -     Status: None   Collection Time: 10/03/19 11:59 AM  Result Value Ref Range   SARS Coronavirus 2 by RT PCR NEGATIVE NEGATIVE    Comment: (NOTE) SARS-CoV-2 target nucleic acids are NOT DETECTED. The SARS-CoV-2 RNA is generally detectable in upper respiratoy specimens during the acute phase of infection. The lowest concentration of SARS-CoV-2 viral copies this assay can detect is 131 copies/mL. A negative result does not preclude SARS-Cov-2 infection and should not be used as the sole basis for treatment or other patient management decisions. A negative result may occur with  improper specimen collection/handling, submission of specimen other than nasopharyngeal swab, presence of viral mutation(s) within the areas targeted by this assay, and inadequate number of viral  copies (<131 copies/mL). A negative result must  be combined with clinical observations, patient history, and epidemiological information. The expected result is Negative. Fact Sheet for Patients:  https://www.moore.com/ Fact Sheet for Healthcare Providers:  https://www.young.biz/ This test is not yet ap proved or cleared by the [place] FDA and  has been authorized for detection and/or diagnosis of SARS-CoV-2 by FDA under an Emergency Use Authorization (EUA). This EUA will remain  in effect (meaning this test can be used) for the duration of the COVID-19 declaration under Section 564(b)(1) of the Act, 21 U.S.C. section 360bbb-3(b)(1), unless the authorization is terminated or revoked sooner.    Influenza A by PCR NEGATIVE NEGATIVE   Influenza B by PCR NEGATIVE NEGATIVE    Comment: (NOTE) The Xpert Xpress SARS-CoV-2/FLU/RSV assay is intended as an aid in  the diagnosis of influenza from Nasopharyngeal swab specimens and  should not be used as a sole basis for treatment. Nasal washings and  aspirates are unacceptable for Xpert Xpress SARS-CoV-2/FLU/RSV  testing. Fact Sheet for Patients: https://www.moore.com/ Fact Sheet for Healthcare Providers: https://www.young.biz/ This test is not yet approved or cleared by the [place] FDA and  has been authorized for detection and/or diagnosis of SARS-CoV-2 by  FDA under an Emergency Use Authorization (EUA). This EUA will remain  in effect (meaning this test can be used) for the duration of the  Covid-19 declaration under Section 564(b)(1) of the Act, 21  U.S.C. section 360bbb-3(b)(1), unless the authorization is  terminated or revoked. Performed at [facility], [address] [place], [place] [address]    CT HEAD WO CONTRAST  Result Date: 10/03/2019 CLINICAL DATA:  Larey Seat. EXAM: CT HEAD WITHOUT CONTRAST TECHNIQUE: Contiguous axial images were  obtained from the base of the skull through the vertex without intravenous contrast. COMPARISON:  04/05/2013 FINDINGS: Brain: Progressive cerebral atrophy, ventriculomegaly and periventricular white matter disease. No extra-axial fluid collections are identified. No CT findings for acute hemispheric infarction or intracranial hemorrhage. No mass lesions. The brainstem and cerebellum are normal. Vascular: Vascular calcifications but no aneurysm or hyperdense vessels. Skull: No skull fracture or bone lesions. Sinuses/Orbits: The paranasal sinuses and mastoid air cells are clear. The globes are intact. Other: No scalp lesions or scalp hematoma. IMPRESSION: 1. Progressive cerebral atrophy, ventriculomegaly and periventricular white matter disease. 2. No acute intracranial findings or skull fracture. Electronically Signed   By: Rudie Meyer M.D.   On: 10/03/2019 11:53   DG Chest Port 1 View  Result Date: 10/03/2019 CLINICAL DATA:  Fever, confusion, weakness EXAM: PORTABLE CHEST 1 VIEW COMPARISON:  Radiograph 04/02/2013 FINDINGS: Normal mediastinum and cardiac silhouette. Chronic central bronchitic markings. Normal pulmonary vasculature. No effusion, infiltrate, or pneumothorax. Rounded calcific density projects over the body of the RIGHT scapula. IMPRESSION: No acute cardiopulmonary process. Rounded calcific density projecting over the RIGHT scapula is favored represent a loose body associated with the glenohumeral joint versus a benign bone lesion of the scapula. Electronically Signed   By: Genevive Bi M.D.   On: 10/03/2019 12:17   DG Foot 2 Views Left  Result Date: 10/03/2019 CLINICAL DATA:  Left foot swelling EXAM: LEFT FOOT - 2 VIEW COMPARISON:  11/18/2015 FINDINGS: Soft tissue ulceration overlies the lateral foot at the level of the fifth metatarsal base. There is mild diffuse soft tissue swelling. No soft tissue gas. No acute fracture identified. No malalignment. Degenerative changes including a  subchondral cyst within the central aspect of the first metatarsal head. 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  IMPRESSION: 1. No acute osseous abnormality. 2. Soft tissue ulceration overlies the lateral foot at the level of the fifth metatarsal base. 3. 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 Electronically Signed   By: Duanne Guess D.O.   On: 10/03/2019 13:19    Pending Labs Unresulted Labs (From admission, onward)    Start     Ordered   10/10/19 0500  Creatinine, serum  (enoxaparin (LOVENOX)    CrCl >/= 30 ml/min)  Weekly,   STAT    Comments: while on enoxaparin therapy    10/03/19 1341   10/04/19 0500  Basic metabolic panel  Tomorrow morning,   STAT     10/03/19 1341   10/04/19 0500  CBC  Tomorrow morning,   STAT     10/03/19 1341   10/03/19 1340  Hemoglobin A1c  Once,   STAT    Comments: To assess prior glycemic control    10/03/19 1341   10/03/19 1336  HIV Antibody (routine testing w rflx)  (HIV Antibody (Routine testing w reflex) panel)  Once,   STAT     10/03/19 1341   10/03/19 1021  Blood Culture (routine x 2)  BLOOD CULTURE X 2,   STAT     10/03/19 1022   10/03/19 1021  Urine culture  ONCE - STAT,   STAT     10/03/19 1022          Vitals/Pain Today's Vitals   10/03/19 1200 10/03/19 1230 10/03/19 1330 10/03/19 1430  BP: (!) 142/90 (!) 143/112 (!) 156/93 (!) 173/81  Pulse: 90 79  87  Resp: Temp:      TempSrc:      SpO2: 100% 100%  97%  Weight:      Height:      PainSc:        Isolation Precautions No active isolations  Medications Medications  buprenorphine-naloxone (SUBOXONE) 8-2 mg per SL tablet 1 tablet (1 tablet Sublingual Given 10/03/19 1356)  cloNIDine (CATAPRES - Dosed in mg/24 hr) patch 0.3 mg (0.3 mg Transdermal Not Given 10/03/19 1528)  ALPRAZolam (XANAX) tablet 1 mg (has no administration in time range)  amphetamine-dextroamphetamine  (ADDERALL XR) 24 hr capsule 30 mg (has no administration in time range)  zolpidem (AMBIEN) tablet 10 mg (has no administration in time range)  pregabalin (LYRICA) capsule 150 mg (has no administration in time range)  enoxaparin (LOVENOX) injection 40 mg (has no administration in time range)  0.9 %  sodium chloride infusion (has no administration in time range)  acetaminophen (TYLENOL) tablet 650 mg (has no administration in time range)    Or  acetaminophen (TYLENOL) suppository 650 mg (has no administration in time range)  ondansetron (ZOFRAN) tablet 4 mg (has no administration in time range)    Or  ondansetron (ZOFRAN) injection 4 mg (has no administration in time range)  insulin aspart (novoLOG) injection 0-15 Units (has no administration in time range)  ceFAZolin (ANCEF) IVPB 1 g/50 mL premix (has no administration in time range)  labetalol (NORMODYNE) injection 10 mg (has no administration in time range)  ceFEPIme (MAXIPIME) 2 g in sodium chloride 0.9 % 100 mL IVPB (0 g Intravenous Stopped 10/03/19 1148)  metroNIDAZOLE (FLAGYL) IVPB 500 mg (0 mg Intravenous Stopped 10/03/19 1257)  vancomycin (VANCOCIN) IVPB 1000 mg/200 mL premix (0 mg Intravenous Stopped 10/03/19 1257)  sodium chloride 0.9 % bolus 1,000 mL (0 mLs Intravenous Stopped 10/03/19 1149)    Mobility non-ambulatory Moderate  fall risk   Focused Assessments    R Recommendations: See Admitting Provider Note  Report given to:

## 2019-10-03 NOTE — Progress Notes (Signed)
PHARMACY -  BRIEF ANTIBIOTIC NOTE   Pharmacy has received consult(s) for Vancomycin and Cefepime from an ED provider.  The patient's profile has been reviewed for ht/wt/allergies/indication/available labs.    One time order(s) placed for Vancomycin 1g IV and Cefepime 2g IV x 1 dose each.  Further antibiotics/pharmacy consults should be ordered by admitting physician if indicated.                       Thank you, Bettey Costa 10/03/2019  10:23 AM

## 2019-10-03 NOTE — ED Notes (Signed)
Pt cleaned with warm wipes upon arrival. Clothes were saturated with urine. Pt placed in hospital gown. No skin breakdown on buttock. Has ulcers to feet. L ankle turned inward, chronic. C/o bilat leg pain and weakness.

## 2019-10-03 NOTE — Progress Notes (Signed)
CODE SEPSIS - PHARMACY COMMUNICATION  **Broad Spectrum Antibiotics should be administered within 1 hour of Sepsis diagnosis**  Time Code Sepsis Called/Page Received: 1024  Antibiotics Ordered: vanc/cefepime/metronidazole  Time of 1st antibiotic administration: 1101  Additional action taken by pharmacy: NA  If necessary, Name of Provider/Nurse Contacted: NA    Pricilla Riffle ,PharmD Clinical Pharmacist  10/03/2019  11:17 AM

## 2019-10-03 NOTE — ED Triage Notes (Addendum)
Pt arrives [facility] from home. Pt was found on floor by transport driver (pt was due to be taken to [place] for suboxone). Pt states in floor 3-4 days. Wife in hospital and pt is wheelchair bound. Ulcers noted to feet. Hasn't taken meds since being in floor. VS with EMS: CBG 302, 98.9 oral, HR 120, 95% RA, 44 CO2, RR 24, 162/100. Pt arrives A&O, has to be waken up to answer questions but answers correctly. Pt falls asleep after answering one question.

## 2019-10-03 NOTE — ED Notes (Signed)
Pt provided with grape juice per request

## 2019-10-04 ENCOUNTER — Encounter: Payer: Self-pay | Admitting: Internal Medicine

## 2019-10-04 ENCOUNTER — Other Ambulatory Visit: Payer: Self-pay

## 2019-10-04 DIAGNOSIS — R531 Weakness: Secondary | ICD-10-CM

## 2019-10-04 DIAGNOSIS — G8929 Other chronic pain: Secondary | ICD-10-CM

## 2019-10-04 DIAGNOSIS — R000 Tachycardia, unspecified: Secondary | ICD-10-CM

## 2019-10-04 LAB — URINE CULTURE: Culture: NO GROWTH

## 2019-10-04 LAB — GLUCOSE, CAPILLARY
Glucose-Capillary: 331 mg/dL — ABNORMAL HIGH (ref 70–99)
Glucose-Capillary: 230 mg/dL — ABNORMAL HIGH (ref 70–99)
Glucose-Capillary: 242 mg/dL — ABNORMAL HIGH (ref 70–99)
Glucose-Capillary: 329 mg/dL — ABNORMAL HIGH (ref 70–99)

## 2019-10-04 LAB — CBC
HCT: 35.6 % — ABNORMAL LOW (ref 39.0–52.0)
Hemoglobin: 11.4 g/dL — ABNORMAL LOW (ref 13.0–17.0)
MCH: 26.8 pg (ref 26.0–34.0)
MCHC: 32 g/dL (ref 30.0–36.0)
MCV: 83.6 fL (ref 80.0–100.0)
Platelets: 119 10*3/uL — ABNORMAL LOW (ref 150–400)
RBC: 4.26 MIL/uL (ref 4.22–5.81)
RDW: 13.7 % (ref 11.5–15.5)
WBC: 8.2 10*3/uL (ref 4.0–10.5)
nRBC: 0 % (ref 0.0–0.2)

## 2019-10-04 LAB — BASIC METABOLIC PANEL
Anion gap: 6 (ref 5–15)
BUN: 11 mg/dL (ref 8–23)
CO2: 27 mmol/L (ref 22–32)
Calcium: 7.9 mg/dL — ABNORMAL LOW (ref 8.9–10.3)
Chloride: 98 mmol/L (ref 98–111)
Creatinine, Ser: 0.86 mg/dL (ref 0.61–1.24)
GFR calc Af Amer: >60 mL/min (ref >60)
GFR calc non Af Amer: >60 mL/min (ref >60)
Glucose, Bld: 340 mg/dL — ABNORMAL HIGH (ref 70–99)
Potassium: 3.4 mmol/L — ABNORMAL LOW (ref 3.5–5.1)
Sodium: 131 mmol/L — ABNORMAL LOW (ref 135–145)

## 2019-10-04 LAB — HEMOGLOBIN A1C
Hgb A1c MFr Bld: 9.6 % — ABNORMAL HIGH (ref 4.8–5.6)
Mean Plasma Glucose: 228.82 mg/dL

## 2019-10-04 LAB — CK: Total CK: 4268 U/L — ABNORMAL HIGH (ref 49–397)

## 2019-10-04 LAB — HIV ANTIBODY (ROUTINE TESTING W REFLEX): HIV Screen 4th Generation wRfx: NONREACTIVE

## 2019-10-04 LAB — MAGNESIUM: Magnesium: 1.8 mg/dL (ref 1.7–2.4)

## 2019-10-04 MED ORDER — POTASSIUM CHLORIDE CRYS ER 20 MEQ PO TBCR
40.0000 meq | EXTENDED_RELEASE_TABLET | Freq: Once | ORAL | Status: AC
  Administered 2019-10-04: 40 meq via ORAL
  Filled 2019-10-04: qty 2

## 2019-10-04 MED ORDER — LIVING WELL WITH DIABETES BOOK
Freq: Once | Status: AC
  Filled 2019-10-04: qty 1

## 2019-10-04 MED ORDER — ORAL CARE MOUTH RINSE
15.0000 mL | Freq: Two times a day (BID) | OROMUCOSAL | Status: DC
  Administered 2019-10-04 (×2): 15 mL via OROMUCOSAL

## 2019-10-04 MED ORDER — INSULIN GLARGINE 100 UNIT/ML ~~LOC~~ SOLN
15.0000 [IU] | Freq: Every day | SUBCUTANEOUS | Status: DC
  Administered 2019-10-04: 15 [IU] via SUBCUTANEOUS
  Filled 2019-10-04 (×2): qty 0.15

## 2019-10-04 NOTE — Progress Notes (Addendum)
Inpatient Diabetes Program Recommendations  AACE/ADA: New Consensus Statement on Inpatient Glycemic Control (2015)  Target Ranges:  Prepandial:   less than 140 mg/dL      Peak postprandial:   less than 180 mg/dL (1-2 hours)      Critically ill patients:  140 - 180 mg/dL   Lab Results  Component Value Date   GLUCAP 230 (H) 10/04/2019   HGBA1C 9.6 (H) 10/04/2019    Review of Glycemic Control  Results for BRAYCEN, BURANDT (MRN [id number]) as of 10/04/2019 12:08  Ref. Range 10/03/2019 17:11 10/03/2019 21:38 10/04/2019 07:53 10/04/2019 11:50  Glucose-Capillary Latest Ref Range: 70 - 99 mg/dL 979 (H) 892 (H) 119 (H) 230 (H)  Results for BLEASE, CAPALDI (MRN [id number]) as of 10/04/2019 12:08  Ref. Range 10/04/2019 05:19  Hemoglobin A1C Latest Ref Range: 4.8 - 5.6 % 9.6 (H)    Diabetes history: DM2  Outpatient Diabetes medications: metformin 1000mg  bid  Current orders for Inpatient glycemic control: novolog 0-15 units tid + 0-5 units qhs  Inpatient Diabetes Program Recommendations:     Lantus 15 units daily (0.15 units/kg)  Sent secure chat to Dr.  Addendum:  Spoke with patient via telephone.  Reviewed patient's current A1c of 9.6%. Explained what a A1c is and what it measures. Also reviewed goal A1c with patient, importance of good glucose control @ home, and blood sugar goals. He does not check his blood sugar at home nor does he have a meter.  He states he drinks regular lemonade and does not watch his CHO intake.  Educated patient on importance of lowering blood sugar.  Reviewed The Plate Method.  He takes above home medication as prescribed.  Ordered Living Well with Diabetes booklet and attached education to AVS.  His PCP is in [facility].  Will ask MD to prescribe a glucometer for discharge.  Asked patient to check his blood sugar before breakfast, mid afternoon and at bedtime and bring his meter to his follow up appointment for PCP to review.  Patient states  he will make diet adjustments and will hopefully gain better glucose control.    MD-Please prescribe glucometer at discharge   Thank you, [facility], RN, BSN Diabetes Coordinator Inpatient Diabetes Program [phone] (team pager from 8a-5p)

## 2019-10-04 NOTE — Evaluation (Signed)
Physical Therapy Evaluation Patient Details Name: Joshua Gordon MRN: [id number] DOB: September 05, 1955 Today's Date: 10/04/2019   History of Present Illness  Pt is a 64 y.o. male presenting to hospital 4/20 s/p fall out of w/c 3 days prior and too weak to get up off floor (found by medical transport service to take to chronic pain clinic).  PMH includes DM, htn, long-term opioid pain management, chronic pain, R TKR (x2), L knee surgery (x5), and h/o L femur fx surgery.  Clinical Impression  Prior to hospital admission, pt was modified independent with w/c level transfers; lives with his wife (who is currently in the hospital) in 1 level home with ramp to enter.  Pt reports switching between manual and power w/c at home (pt reports his shoulders hurt when using manual w/c and is trying to get new power w/c); pt also reports he stays in w/c at all times (sleeps sitting upright in w/c) except when transferring to toilet and to transfer to take a shower.  During session, pt performed stand pivot transfer bed to recliner to L with CGA; steady strong transfer noted (pt kept L LE NWB'ing d/t L knee fixed in extension and L ankle positioned in inversion).  Pt does report 11 falls in past 6 months.  Pt would benefit from skilled PT to address noted impairments and functional limitations (see below for any additional details).  Upon hospital discharge, pt would benefit from HHPT for strengthening and to assess for safety of transfers within home.    Follow Up Recommendations Home health PT    Equipment Recommendations  Other (comment)(pt reports having needed DME at home)    Recommendations for Other Services       Precautions / Restrictions Precautions Precautions: Fall Restrictions Weight Bearing Restrictions: No      Mobility  Bed Mobility Overal bed mobility: Modified Independent             General bed mobility comments: Semi-supine to sitting edge of bed without any noted  difficulties  Transfers Overall transfer level: Needs assistance Equipment used: None Transfers: Stand Pivot Transfers   Stand pivot transfers: Min guard       General transfer comment: stand pivot to L bed to recliner with UE support on bed and chair; steady safe transfer noted; appearing strong with transfer; CGA provided for safety  Ambulation/Gait             General Gait Details: Deferred (pt non-ambulatory)  Stairs            Wheelchair Mobility    Modified Rankin (Stroke Patients Only)       Balance Overall balance assessment: Modified Independent Sitting-balance support: No upper extremity supported(R LE supported; L LE off floor) Sitting balance-Leahy Scale: Normal Sitting balance - Comments: steady sitting reaching outside BOS                                     Pertinent Vitals/Pain Pain Assessment: Faces Faces Pain Scale: No hurt(beginning/end of session at rest 0/10; 4/10 with activity) Pain Intervention(s): Limited activity within patient's tolerance;Monitored during session;Repositioned  HR stable and WFL throughout treatment session.  O2 sats 91% or greater on 2 L O2 via nasal cannula during sessions activities.    Home Living Family/patient expects to be discharged to:: Private residence Living Arrangements: Spouse/significant other(spouse currently in hospital) Available Help at Discharge: Family Type of Home: House Home  Access: Ramped entrance     Home Layout: One level Home Equipment: Grab bars - toilet;Wheelchair - Engineer, technical sales - power;Tub bench;Grab bars - tub/shower;Toilet riser;Bedside commode Additional Comments: Pt reports working on getting new power w/c.    Prior Function Level of Independence: Independent with assistive device(s)         Comments: Pt switches between using power w/c and manual w/c (doesn't like to use manual w/c as much d/t hurting his shoulder from propelling w/c); 11 falls in past 6  months     Hand Dominance        Extremity/Trunk Assessment   Upper Extremity Assessment Upper Extremity Assessment: Generalized weakness    Lower Extremity Assessment Lower Extremity Assessment: RLE deficits/detail;LLE deficits/detail RLE Deficits / Details: DF/PF AROM 2/5; at least 3/5 R knee flexion/extension AROM; hip flexion at least 3/5 AROM LLE Deficits / Details: L ankle positioned in inversion; L knee positioned straight (pt reports he has a "rod" through knee and can't straighten it); hip flexion at least 3/5 AROM    Cervical / Trunk Assessment Cervical / Trunk Assessment: Normal  Communication   Communication: No difficulties  Cognition Arousal/Alertness: Awake/alert Behavior During Therapy: WFL for tasks assessed/performed Overall Cognitive Status: Within Functional Limits for tasks assessed                                        General Comments General comments (skin integrity, edema, etc.): bandages noted B feet    Exercises   Nursing cleared pt for participation in physical therapy.  Pt agreeable to PT session.   Assessment/Plan    PT Assessment Patient needs continued PT services  PT Problem List Decreased strength;Decreased mobility;Pain       PT Treatment Interventions DME instruction;Functional mobility training;Therapeutic activities;Therapeutic exercise;Balance training;Patient/family education    PT Goals (Current goals can be found in the Care Plan section)  Acute Rehab PT Goals Patient Stated Goal: to improve strength PT Goal Formulation: With patient Time For Goal Achievement: 10/18/19 Potential to Achieve Goals: Good    Frequency Min 2X/week   Barriers to discharge        Co-evaluation               AM-PAC PT "6 Clicks" Mobility  Outcome Measure Help needed turning from your back to your side while in a flat bed without using bedrails?: None Help needed moving from lying on your back to sitting on the side  of a flat bed without using bedrails?: None Help needed moving to and from a bed to a chair (including a wheelchair)?: A Little Help needed standing up from a chair using your arms (e.g., wheelchair or bedside chair)?: A Little Help needed to walk in hospital room?: Total Help needed climbing 3-5 steps with a railing? : Total 6 Click Score: 16    End of Session Equipment Utilized During Treatment: Gait belt;Oxygen(2 L O2 via nasal cannula) Activity Tolerance: Patient tolerated treatment well Patient left: in chair;with call bell/phone within reach;with chair alarm set;Other (comment)(B heels floating via pillows) Nurse Communication: Mobility status;Precautions;Weight bearing status PT Visit Diagnosis: Other abnormalities of gait and mobility (R26.89);Muscle weakness (generalized) (M62.81);History of falling (Z91.81)    Time: 6767-2094 PT Time Calculation (min) (ACUTE ONLY): 27 min   Charges:   PT Evaluation $PT Eval Low Complexity: 1 Low PT Treatments $Therapeutic Activity: 8-22 mins  Leitha Bleak, PT 10/04/19, 1:17 PM

## 2019-10-04 NOTE — Progress Notes (Signed)
Wound care completed per orders. Tolerated well.

## 2019-10-04 NOTE — Consult Note (Signed)
WOC Nurse Consult Note: Reason for Consult:Left dorsal and lateral foot with  Devitalized tissue to woundbed.  Scabbed lesion to right lateral lower leg Wound type: chronic venous insufficiency with edema to lower legs.  Pressure Injury POA: NA Measurement: LEft dorsal and lateral foot:  3 cm x 2.5 cm with slough to wound bed Right lateral leg with 1 cm round scabbed lesion Wound bed: devitalized tissue Drainage (amount, consistency, odor) moderate serosanguinous  No odor.  Periwound: dry skin Dressing procedure/placement/frequency: Bedside Rn to apply: Cleanse wounds to left foot and right lateral leg with NS and pat dry. Apply Xeroform gauze to wound bed.  Wrap legs with kerlix and secure with self adherent coban from below toes to below knees.  Change Mon/wed/Fri Will not follow at this time.  Please re-consult if needed.  Maple Hudson MSN, RN, FNP-BC CWON Wound, Ostomy, Continence Nurse Pager [phone]

## 2019-10-04 NOTE — TOC Initial Note (Signed)
Transition of Care [facility]) - Initial/Assessment Note    Patient Details  Name: Joshua Gordon MRN: [id number] Date of Birth: Aug 19, 1955  [facility] CM/SW Contact:    Victorino Dike, RN Phone Number: 10/04/2019, 2:38 PM  Clinical Narrative:                   Met with patient in room.  He reports having necessary DME in home, he reports he has been using [facility] service, Phone call placed to [place] with [facility] to verify services and continued care at discharge.  Patient is concerned for his cat at home and wants to leave.  He also became tearful when I mentioned his wife was going to be discharged home.  He stated he wants to be home.   Expected Discharge Plan: [place] Barriers to Discharge: Continued Medical Work up   Patient Goals and CMS Choice Patient states their goals for this hospitalization and ongoing recovery are:: To return home to wife and cat CMS Medicare.gov Compare Post Acute Care list provided to:: Patient Choice offered to / list presented to : Patient  Expected Discharge Plan and Services Expected Discharge Plan: [place]   Discharge Planning Services: CM Consult Post Acute Care Choice: [place] arrangements for the past 2 months: Single Family Home                                      Prior Living Arrangements/Services Living arrangements for the past 2 months: Single Family Home Lives with:: Spouse Patient language and need for interpreter reviewed:: Yes Do you feel safe going back to the place where you live?: Yes      Need for Family Participation in Patient Care: No (Comment) Care giver support system in place?: Yes (comment) Current home services: DME    Activities of Daily Living Home Assistive Devices/Equipment: Wheelchair ADL Screening (condition at time of admission) Patient's cognitive ability adequate to safely complete daily activities?: Yes Is  the patient deaf or have difficulty hearing?: No Does the patient have difficulty seeing, even when wearing glasses/contacts?: No Does the patient have difficulty concentrating, remembering, or making decisions?: No Patient able to express need for assistance with ADLs?: Yes Does the patient have difficulty dressing or bathing?: Yes Independently performs ADLs?: No Does the patient have difficulty walking or climbing stairs?: Yes Weakness of Legs: Both Weakness of Arms/Hands: Both  Permission Sought/Granted                  Emotional Assessment Appearance:: Appears stated age Attitude/Demeanor/Rapport: Crying, Engaged Affect (typically observed): Tearful/Crying, Accepting Orientation: : Oriented to Self, Oriented to Place, Oriented to  Time, Oriented to Situation Alcohol / Substance Use: Never Used Psych Involvement: No (comment)  Admission diagnosis:  Rhabdomyolysis [M62.82] Dehydration [E86.0] Generalized weakness [R53.1] Traumatic rhabdomyolysis, initial encounter ([place]) [T79.6XXA] Sepsis, due to unspecified organism, unspecified whether acute organ dysfunction present [facility]) [A41.9] Patient Active Problem List   Diagnosis Date Noted  . Rhabdomyolysis 10/03/2019  . Cellulitis 10/03/2019  . Hypertension   . Diabetes mellitus without complication ([place])   . Chronic pain    PCP:  Jani Gravel, MD Pharmacy:   [place], [place], [place] [address] [place] [address] Phone: [phone] Fax: [phone]  [facility]  [place], [place] [address] [place] [address] Phone: [phone] Fax: [phone]     Social Determinants of Health (SDOH) Interventions    Readmission Risk Interventions No flowsheet data found.

## 2019-10-04 NOTE — Progress Notes (Addendum)
PROGRESS NOTE    Joshua Gordon  ZOX:[id number] DOB: 1956-02-09 DOA: 10/03/2019 PCP: Pearson Grippe, MD   Brief Narrative:  Joshua Gordon is a 64 y.o. male with medical history significant for diabetes mellitus, hypertension, chronically wheelchair-bound, long-term opiate management who presents to the emergency room after he fell out of his wheelchair 3 days prior to his admission thank you thank you is a sweet thank you this is going to respiratory good thank you and was too weak to get up off the floor. Per patient he has not had anything to eat in 3 days and was found this morning by medical transport service that takes him to his chronic pain clinic. Upon arrival to the emergency room he had a low-grade temp with a T-max of 100.5 and he was tachycardic at 112.  Lactic acid was 1.4. Patient had a left foot x-ray which showed no acute osseous abnormality. Soft tissue ulceration overlies the lateral foot at the level of the fifth metatarsal base. 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 Chest x-ray showed no acute findings and urinalysis was sterile Labs revealed an elevated total CK level greater than 5000.  Admitted for rhabdomyolysis.  Subjective: Patient was complaining of pain in his right arm and left ankle stating that it get worse after fall.  He is not on any oxygen at home and was saturating well on 2 L.  Assessment & Plan:   Principal Problem:   Rhabdomyolysis Active Problems:   Hypertension   Diabetes mellitus without complication (HCC)   Chronic pain   Cellulitis  Rhabdomyolysis.  Patient remained on floor for extended time. CK started improving with IV hydration. -Continue IV fluid. -Monitor CK -PT is recommending home health services-patient is already established with [facility] and will continue with them.  Bilateral lower extremity cellulitis Patient was tachycardic and had a low-grade fever in the emergency room with a T-max of 100.24F.   Afebrile this morning with maximum temperature of 101.2 over the last 24 hours. Patient has chronic lower extremity wounds. He was started on Ancef empirically. Cultures remain negative. -Continue Ancef.  Type 2 diabetes.  Uncontrolled with hyperglycemia.  A1c of 9.6 with CBG remained mostly in 300s. -Add Lantus 15 units at bedtime. -Continue with sliding scale  Hypertension..  Blood pressure within goal -Continue home dose of amlodipine.  Hypokalemia.  Potassium at 3.4 with magnesium of 1.8 -Replete electrolytes as needed.  Objective: Vitals:   10/04/19 0900 10/04/19 1046 10/04/19 1151 10/04/19 1552  BP: 93/61 102/70 100/65 107/78  Pulse: 77 65 81 79  Resp:   18 16  Temp:  98.2 F (36.8 C) 98.4 F (36.9 C) 98.1 F (36.7 C)  TempSrc:  Oral Oral Oral  SpO2:  97% 95% 94%  Weight:      Height:        Intake/Output Summary (Last 24 hours) at 10/04/2019 1815 Last data filed at 10/04/2019 1556 Gross per 24 hour  Intake 240 ml  Output 1660 ml  Net -1420 ml   Filed Weights   10/03/19 1020 10/04/19 0227  Weight: 95.4 kg 102.7 kg    Examination:  General exam: Chronically ill-appearing gentleman, appears calm and comfortable  Respiratory system: Clear to auscultation. Respiratory effort normal. Cardiovascular system: S1 & S2 heard, RRR. No JVD, murmurs, rubs, gallops or clicks. Gastrointestinal system: Soft, nontender, nondistended, bowel sounds positive. Central nervous system: Alert and oriented. No focal neurological deficits. Extremities: Bilateral lower extremity multiple skin lesions with  tophi and ulceration left foot with contractures. Psychiatry: Judgement and insight appear normal.     DVT prophylaxis: Lovenox Code Status: Full Family Communication: Discussed with patient Disposition Plan:  Status is: Inpatient  Remains inpatient appropriate because:IV treatments appropriate due to intensity of illness or inability to take PO   Dispo: The patient is  from: Home              Anticipated d/c is to: Home              Anticipated d/c date is: 2 days              Patient currently is not medically stable to d/c.  Consultants:   None  Procedures:  Antimicrobials:  Ancef  Data Reviewed: I have personally reviewed following labs and imaging studies  CBC: Recent Labs  Lab 10/03/19 1036 10/04/19 0519  WBC 11.6* 8.2  NEUTROABS 9.5*  --   HGB 14.1 11.4*  HCT 43.9 35.6*  MCV 82.4 83.6  PLT 142* 119*   Basic Metabolic Panel: Recent Labs  Lab 10/03/19 1036 10/04/19 0519  NA 134* 131*  K 3.6 3.4*  CL 99 98  CO2 24 27  GLUCOSE 253* 340*  BUN 7* 11  CREATININE 0.79 0.86  CALCIUM 9.2 7.9*  MG  --  1.8   GFR: Estimated Creatinine Clearance: 113.5 mL/min (by C-G formula based on SCr of 0.86 mg/dL). Liver Function Tests: Recent Labs  Lab 10/03/19 1036  AST 92*  ALT 26  ALKPHOS 80  BILITOT 1.2  PROT 8.3*  ALBUMIN 3.9   No results for input(s): LIPASE, AMYLASE in the last 168 hours. No results for input(s): AMMONIA in the last 168 hours. Coagulation Profile: Recent Labs  Lab 10/03/19 1036  INR 1.2   Cardiac Enzymes: Recent Labs  Lab 10/03/19 1036 10/04/19 0519  CKTOTAL 5,771* 4,268*   BNP (last 3 results) No results for input(s): PROBNP in the last 8760 hours. HbA1C: Recent Labs    10/04/19 0519  HGBA1C 9.6*   CBG: Recent Labs  Lab 10/03/19 1711 10/03/19 2138 10/04/19 0753 10/04/19 1150 10/04/19 1620  GLUCAP 281* 289* 331* 230* 242*   Lipid Profile: No results for input(s): CHOL, HDL, LDLCALC, TRIG, CHOLHDL, LDLDIRECT in the last 72 hours. Thyroid Function Tests: No results for input(s): TSH, T4TOTAL, FREET4, T3FREE, THYROIDAB in the last 72 hours. Anemia Panel: No results for input(s): VITAMINB12, FOLATE, FERRITIN, TIBC, IRON, RETICCTPCT in the last 72 hours. Sepsis Labs: Recent Labs  Lab 10/03/19 1036 10/03/19 1159  PROCALCITON <0.10  --   LATICACIDVEN 1.4 1.7    Recent Results  (from the past 240 hour(s))  Blood Culture (routine x 2)     Status: None (Preliminary result)   Collection Time: 10/03/19 10:36 AM   Specimen: BLOOD  Result Value Ref Range Status   Specimen Description BLOOD RIGHT HAND  Final   Special Requests   Final    BOTTLES DRAWN AEROBIC AND ANAEROBIC Blood Culture adequate volume   Culture   Final    NO GROWTH < 24 HOURS Performed at [facility], [address] [place], [place] [address]    Report Status PENDING  Incomplete  Blood Culture (routine x 2)     Status: None (Preliminary result)   Collection Time: 10/03/19 11:15 AM   Specimen: BLOOD  Result Value Ref Range Status   Specimen Description BLOOD LEFT ASSIST CONTROL  Final   Special Requests   Final  BOTTLES DRAWN AEROBIC AND ANAEROBIC Blood Culture adequate volume   Culture   Final    NO GROWTH < 24 HOURS Performed at [facility], [address] [place], [place] [address]    Report Status PENDING  Incomplete  Urine culture     Status: None   Collection Time: 10/03/19 11:59 AM   Specimen: Urine, Random  Result Value Ref Range Status   Specimen Description   Final    URINE, RANDOM Performed at [facility], [address], [place], [place] [address]    Special Requests   Final    NONE Performed at [facility], [address], [place], [place] [address]    Culture   Final    NO GROWTH Performed at [facility], [address], [place], [place] [address]    Report Status 10/04/2019 FINAL  Final  Respiratory Panel by RT PCR (Flu A&B, Covid) -     Status: None   Collection Time: 10/03/19 11:59 AM  Result Value Ref Range Status   SARS Coronavirus 2 by RT PCR NEGATIVE NEGATIVE Final    Comment: (NOTE) SARS-CoV-2 target nucleic acids are NOT DETECTED. The SARS-CoV-2 RNA is generally detectable in upper respiratoy specimens during the acute phase of infection. The lowest concentration of SARS-CoV-2 viral copies this assay can  detect is 131 copies/mL. A negative result does not preclude SARS-Cov-2 infection and should not be used as the sole basis for treatment or other patient management decisions. A negative result may occur with  improper specimen collection/handling, submission of specimen other than nasopharyngeal swab, presence of viral mutation(s) within the areas targeted by this assay, and inadequate number of viral copies (<131 copies/mL). A negative result must be combined with clinical observations, patient history, and epidemiological information. The expected result is Negative. Fact Sheet for Patients:  https://www.moore.com/ Fact Sheet for Healthcare Providers:  https://www.young.biz/ This test is not yet ap proved or cleared by the [place] FDA and  has been authorized for detection and/or diagnosis of SARS-CoV-2 by FDA under an Emergency Use Authorization (EUA). This EUA will remain  in effect (meaning this test can be used) for the duration of the COVID-19 declaration under Section 564(b)(1) of the Act, 21 U.S.C. section 360bbb-3(b)(1), unless the authorization is terminated or revoked sooner.    Influenza A by PCR NEGATIVE NEGATIVE Final   Influenza B by PCR NEGATIVE NEGATIVE Final    Comment: (NOTE) The Xpert Xpress SARS-CoV-2/FLU/RSV assay is intended as an aid in  the diagnosis of influenza from Nasopharyngeal swab specimens and  should not be used as a sole basis for treatment. Nasal washings and  aspirates are unacceptable for Xpert Xpress SARS-CoV-2/FLU/RSV  testing. Fact Sheet for Patients: https://www.moore.com/ Fact Sheet for Healthcare Providers: https://www.young.biz/ This test is not yet approved or cleared by the [place] FDA and  has been authorized for detection and/or diagnosis of SARS-CoV-2 by  FDA under an Emergency Use Authorization (EUA). This EUA will remain  in effect (meaning  this test can be used) for the duration of the  Covid-19 declaration under Section 564(b)(1) of the Act, 21  U.S.C. section 360bbb-3(b)(1), unless the authorization is  terminated or revoked. Performed at [facility], [address], [place], [place] [address]      Radiology Studies: CT HEAD WO CONTRAST  Result Date: 10/03/2019 CLINICAL DATA:  Larey Seat. EXAM: CT HEAD WITHOUT CONTRAST TECHNIQUE: Contiguous axial images were obtained from the base of the skull  through the vertex without intravenous contrast. COMPARISON:  04/05/2013 FINDINGS: Brain: Progressive cerebral atrophy, ventriculomegaly and periventricular white matter disease. No extra-axial fluid collections are identified. No CT findings for acute hemispheric infarction or intracranial hemorrhage. No mass lesions. The brainstem and cerebellum are normal. Vascular: Vascular calcifications but no aneurysm or hyperdense vessels. Skull: No skull fracture or bone lesions. Sinuses/Orbits: The paranasal sinuses and mastoid air cells are clear. The globes are intact. Other: No scalp lesions or scalp hematoma. IMPRESSION: 1. Progressive cerebral atrophy, ventriculomegaly and periventricular white matter disease. 2. No acute intracranial findings or skull fracture. Electronically Signed   By: Marijo Sanes M.D.   On: 10/03/2019 11:53   DG Chest Port 1 View  Result Date: 10/03/2019 CLINICAL DATA:  Fever, confusion, weakness EXAM: PORTABLE CHEST 1 VIEW COMPARISON:  Radiograph 04/02/2013 FINDINGS: Normal mediastinum and cardiac silhouette. Chronic central bronchitic markings. Normal pulmonary vasculature. No effusion, infiltrate, or pneumothorax. Rounded calcific density projects over the body of the RIGHT scapula. IMPRESSION: No acute cardiopulmonary process. Rounded calcific density projecting over the RIGHT scapula is favored represent a loose body associated with the glenohumeral joint versus a benign bone lesion of the scapula. Electronically  Signed   By: Suzy Bouchard M.D.   On: 10/03/2019 12:17   DG Foot 2 Views Left  Result Date: 10/03/2019 CLINICAL DATA:  Left foot swelling EXAM: LEFT FOOT - 2 VIEW COMPARISON:  11/18/2015 FINDINGS: Soft tissue ulceration overlies the lateral foot at the level of the fifth metatarsal base. There is mild diffuse soft tissue swelling. No soft tissue gas. No acute fracture identified. No malalignment. Degenerative changes including a subchondral cyst within the central aspect of the first metatarsal head. 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 IMPRESSION: 1. No acute osseous abnormality. 2. Soft tissue ulceration overlies the lateral foot at the level of the fifth metatarsal base. 3. 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 Electronically Signed   By: Davina Poke D.O.   On: 10/03/2019 13:19    Scheduled Meds: . ALPRAZolam  1 mg Oral BID  . amLODipine  5 mg Oral Daily  . buprenorphine-naloxone  1 tablet Sublingual TID  . cloNIDine  0.2 mg Oral TID  . enoxaparin (LOVENOX) injection  40 mg Subcutaneous Q24H  . insulin aspart  0-15 Units Subcutaneous TID WC  . insulin aspart  0-5 Units Subcutaneous QHS  . insulin glargine  15 Units Subcutaneous QHS  . mouth rinse  15 mL Mouth Rinse BID  . pregabalin  150 mg Oral BID   Continuous Infusions: . sodium chloride 125 mL/hr at 10/04/19 1205  .  ceFAZolin (ANCEF) IV 1 g (10/04/19 1543)  . diltiazem (CARDIZEM) infusion Stopped (10/04/19 0909)     LOS: 1 day   Time spent: 45 Minutes.  Lorella Nimrod, MD [facility]  If 7PM-7AM, please contact night-coverage [email or web address]  10/04/2019, 6:15 PM   This record has been created using Dragon voice recognition software. Errors have been sought and corrected,but may not always be located. Such creation errors do not reflect on the standard of care.

## 2019-10-04 NOTE — ED Notes (Signed)
Per floor they need to get a bed for pt room and then he is clear to come up

## 2019-10-04 NOTE — Progress Notes (Signed)
Medications administered by Chloe Yang Student Nurse were supervised by this RN Lielle Vandervort A Westin Knotts  

## 2019-10-05 LAB — BASIC METABOLIC PANEL
Anion gap: 6 (ref 5–15)
BUN: 8 mg/dL (ref 8–23)
CO2: 27 mmol/L (ref 22–32)
Calcium: 8.1 mg/dL — ABNORMAL LOW (ref 8.9–10.3)
Chloride: 102 mmol/L (ref 98–111)
Creatinine, Ser: 0.77 mg/dL (ref 0.61–1.24)
GFR calc Af Amer: >60 mL/min (ref >60)
GFR calc non Af Amer: >60 mL/min (ref >60)
Glucose, Bld: 318 mg/dL — ABNORMAL HIGH (ref 70–99)
Potassium: 4.2 mmol/L (ref 3.5–5.1)
Sodium: 135 mmol/L (ref 135–145)

## 2019-10-05 LAB — GLUCOSE, CAPILLARY
Glucose-Capillary: 327 mg/dL — ABNORMAL HIGH (ref 70–99)
Glucose-Capillary: 256 mg/dL — ABNORMAL HIGH (ref 70–99)
Glucose-Capillary: 191 mg/dL — ABNORMAL HIGH (ref 70–99)
Glucose-Capillary: 234 mg/dL — ABNORMAL HIGH (ref 70–99)

## 2019-10-05 LAB — CBC
HCT: 32.9 % — ABNORMAL LOW (ref 39.0–52.0)
Hemoglobin: 10.5 g/dL — ABNORMAL LOW (ref 13.0–17.0)
MCH: 26.3 pg (ref 26.0–34.0)
MCHC: 31.9 g/dL (ref 30.0–36.0)
MCV: 82.3 fL (ref 80.0–100.0)
Platelets: 112 10*3/uL — ABNORMAL LOW (ref 150–400)
RBC: 4 MIL/uL — ABNORMAL LOW (ref 4.22–5.81)
RDW: 13.7 % (ref 11.5–15.5)
WBC: 5.7 10*3/uL (ref 4.0–10.5)
nRBC: 0 % (ref 0.0–0.2)

## 2019-10-05 LAB — CK: Total CK: 2746 U/L — ABNORMAL HIGH (ref 49–397)

## 2019-10-05 MED ORDER — INSULIN ASPART 100 UNIT/ML ~~LOC~~ SOLN
3.0000 [IU] | Freq: Three times a day (TID) | SUBCUTANEOUS | Status: DC
  Administered 2019-10-05 – 2019-10-06 (×3): 3 [IU] via SUBCUTANEOUS
  Filled 2019-10-05 (×3): qty 1

## 2019-10-05 MED ORDER — ENSURE MAX PROTEIN PO LIQD
11.0000 [oz_av] | Freq: Two times a day (BID) | ORAL | Status: DC
  Administered 2019-10-05 – 2019-10-06 (×3): 11 [oz_av] via ORAL
  Filled 2019-10-05: qty 330

## 2019-10-05 MED ORDER — INSULIN GLARGINE 100 UNIT/ML ~~LOC~~ SOLN
20.0000 [IU] | Freq: Every day | SUBCUTANEOUS | Status: DC
  Administered 2019-10-05: 20 [IU] via SUBCUTANEOUS
  Filled 2019-10-05 (×2): qty 0.2

## 2019-10-05 NOTE — Plan of Care (Signed)
  Problem: Education: Goal: Knowledge of General Education information will improve Description: Including pain rating scale, medication(s)/side effects and non-pharmacologic comfort measures Outcome: Progressing Note: Patient wants to go home this AM to be with his cat, and this is apparently ongoing. Awaiting physician to determine plan for patient for today. Appears he may be staying for one more night. Will continue to monitor overall progression for the remainder of the shift. Jari Favre [facility]

## 2019-10-05 NOTE — Progress Notes (Signed)
PROGRESS NOTE    Joshua Gordon  TSV:[id number] DOB: [birth year] DOA: 10/03/2019 PCP: Pearson Grippe, MD   Brief Narrative:  Joshua Gordon is a 64 y.o. male with medical history significant for diabetes mellitus, hypertension, chronically wheelchair-bound, long-term opiate management who presents to the emergency room after he fell out of his wheelchair 3 days prior to his admission thank you thank you is a sweet thank you this is going to respiratory good thank you and was too weak to get up off the floor. Per patient he has not had anything to eat in 3 days and was found this morning by medical transport service that takes him to his chronic pain clinic. Upon arrival to the emergency room he had a low-grade temp with a T-max of 100.5 and he was tachycardic at 112.  Lactic acid was 1.4. Patient had a left foot x-ray which showed no acute osseous abnormality. Soft tissue ulceration overlies the lateral foot at the level of the fifth metatarsal base. 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 Chest x-ray showed no acute findings and urinalysis was sterile Labs revealed an elevated total CK level greater than 5000.  Admitted for rhabdomyolysis.  Subjective: Patient was worried about his cat and wife who is also admitted.  He was stating if his wife is being discharged he has to go as she cannot take care of herself.  Assessment & Plan:   Principal Problem:   Rhabdomyolysis Active Problems:   Hypertension   Diabetes mellitus without complication (HCC)   Chronic pain   Cellulitis   Generalized weakness  Rhabdomyolysis.  Patient remained on floor for extended time. CK started improving with IV hydration, it was 2746 today. -Continue IV fluid. -Monitor CK-if continue to drop then most likely can go home tomorrow. -PT is recommending home health services-patient is already established with [facility] and will continue with them.  Bilateral lower extremity  cellulitis Patient was tachycardic and had a low-grade fever in the emergency room with a T-max of 100.53F.  Remained afebrile over the past 24 hours. Patient has chronic lower extremity wounds. He was started on Ancef empirically-switch him to Keflex to complete a 5-day course. Cultures remain negative. -Continue Ancef.  Type 2 diabetes.  Uncontrolled with hyperglycemia.  A1c of 9.6 with CBG remained elevated. -Increase Lantus to 20 units at bedtime. -Add 3 units for mealtime coverage. -Continue with sliding scale  Hypertension..  Blood pressure within goal -Continue home dose of amlodipine.  Hypokalemia.  Resolved -Replete electrolytes as needed.  Objective: Vitals:   10/05/19 0406 10/05/19 0500 10/05/19 0747 10/05/19 1138  BP: 112/60  113/64 121/72  Pulse: 88  89 78  Resp:   18 18  Temp: 99.2 F (37.3 C) 98.9 F (37.2 C) 98.6 F (37 C)   TempSrc: Oral Oral Oral   SpO2: 95%  95% 99%  Weight: 106.8 kg     Height:        Intake/Output Summary (Last 24 hours) at 10/05/2019 1234 Last data filed at 10/05/2019 1141 Gross per 24 hour  Intake 1644.34 ml  Output 3880 ml  Net -2235.66 ml   Filed Weights   10/03/19 1020 10/04/19 0227 10/05/19 0406  Weight: 95.4 kg 102.7 kg 106.8 kg    Examination:  General exam: Chronically ill-appearing gentleman, appears calm and comfortable  Respiratory system: Clear to auscultation. Respiratory effort normal. Cardiovascular system: S1 & S2 heard, RRR. No JVD, murmurs, rubs, gallops or clicks. Gastrointestinal system: Soft, nontender,  nondistended, bowel sounds positive. Central nervous system: Alert and oriented. No focal neurological deficits. Extremities: Bilateral lower extremity multiple skin lesions with tophi and ulceration left foot with contractures. Psychiatry: Judgement and insight appear normal.     DVT prophylaxis: Lovenox Code Status: Full Family Communication: Discussed with patient Disposition Plan:  Status is:  Inpatient  Remains inpatient appropriate because:IV treatments appropriate due to intensity of illness or inability to take PO   Dispo: The patient is from: Home              Anticipated d/c is to: Home              Anticipated d/c date is: 1 day              Patient currently is not medically stable to d/c.  We will continue with IV fluid as CK is decreasing appropriately, most likely will be able to go home tomorrow.  Consultants:   None  Procedures:  Antimicrobials:  Keflex  Data Reviewed: I have personally reviewed following labs and imaging studies  CBC: Recent Labs  Lab 10/03/19 1036 10/04/19 0519 10/05/19 0558  WBC 11.6* 8.2 5.7  NEUTROABS 9.5*  --   --   HGB 14.1 11.4* 10.5*  HCT 43.9 35.6* 32.9*  MCV 82.4 83.6 82.3  PLT 142* 119* 112*   Basic Metabolic Panel: Recent Labs  Lab 10/03/19 1036 10/04/19 0519 10/05/19 0558  NA 134* 131* 135  K 3.6 3.4* 4.2  CL 99 98 102  CO2 24 27 27   GLUCOSE 253* 340* 318*  BUN 7* 11 8  CREATININE 0.79 0.86 0.77  CALCIUM 9.2 7.9* 8.1*  MG  --  1.8  --    GFR: Estimated Creatinine Clearance: 124.4 mL/min (by C-G formula based on SCr of 0.77 mg/dL). Liver Function Tests: Recent Labs  Lab 10/03/19 1036  AST 92*  ALT 26  ALKPHOS 80  BILITOT 1.2  PROT 8.3*  ALBUMIN 3.9   No results for input(s): LIPASE, AMYLASE in the last 168 hours. No results for input(s): AMMONIA in the last 168 hours. Coagulation Profile: Recent Labs  Lab 10/03/19 1036  INR 1.2   Cardiac Enzymes: Recent Labs  Lab 10/03/19 1036 10/04/19 0519 10/05/19 0558  CKTOTAL 5,771* 4,268* 2,746*   BNP (last 3 results) No results for input(s): PROBNP in the last 8760 hours. HbA1C: Recent Labs    10/04/19 0519  HGBA1C 9.6*   CBG: Recent Labs  Lab 10/04/19 1150 10/04/19 1620 10/04/19 2039 10/05/19 0745 10/05/19 1139  GLUCAP 230* 242* 329* 327* 256*   Lipid Profile: No results for input(s): CHOL, HDL, LDLCALC, TRIG, CHOLHDL,  LDLDIRECT in the last 72 hours. Thyroid Function Tests: No results for input(s): TSH, T4TOTAL, FREET4, T3FREE, THYROIDAB in the last 72 hours. Anemia Panel: No results for input(s): VITAMINB12, FOLATE, FERRITIN, TIBC, IRON, RETICCTPCT in the last 72 hours. Sepsis Labs: Recent Labs  Lab 10/03/19 1036 10/03/19 1159  PROCALCITON <0.10  --   LATICACIDVEN 1.4 1.7    Recent Results (from the past 240 hour(s))  Blood Culture (routine x 2)     Status: None (Preliminary result)   Collection Time: 10/03/19 10:36 AM   Specimen: BLOOD  Result Value Ref Range Status   Specimen Description BLOOD RIGHT HAND  Final   Special Requests   Final    BOTTLES DRAWN AEROBIC AND ANAEROBIC Blood Culture adequate volume   Culture   Final    NO GROWTH 2 DAYS Performed at  [facility], [address] [place], [place] [address]    Report Status PENDING  Incomplete  Blood Culture (routine x 2)     Status: None (Preliminary result)   Collection Time: 10/03/19 11:15 AM   Specimen: BLOOD  Result Value Ref Range Status   Specimen Description BLOOD LEFT ASSIST CONTROL  Final   Special Requests   Final    BOTTLES DRAWN AEROBIC AND ANAEROBIC Blood Culture adequate volume   Culture   Final    NO GROWTH 2 DAYS Performed at [facility], [address] [place], [place] [address]    Report Status PENDING  Incomplete  Urine culture     Status: None   Collection Time: 10/03/19 11:59 AM   Specimen: Urine, Random  Result Value Ref Range Status   Specimen Description   Final    URINE, RANDOM Performed at [facility], [address], [place], [place] [address]    Special Requests   Final    NONE Performed at [facility], [address], [place], [place] [address]    Culture   Final    NO GROWTH Performed at [facility], [place] [address] [place], [place] [address]    Report Status 10/04/2019 FINAL  Final  Respiratory Panel by RT PCR (Flu A&B, Covid) -      Status: None   Collection Time: 10/03/19 11:59 AM  Result Value Ref Range Status   SARS Coronavirus 2 by RT PCR NEGATIVE NEGATIVE Final    Comment: (NOTE) SARS-CoV-2 target nucleic acids are NOT DETECTED. The SARS-CoV-2 RNA is generally detectable in upper respiratoy specimens during the acute phase of infection. The lowest concentration of SARS-CoV-2 viral copies this assay can detect is 131 copies/mL. A negative result does not preclude SARS-Cov-2 infection and should not be used as the sole basis for treatment or other patient management decisions. A negative result may occur with  improper specimen collection/handling, submission of specimen other than nasopharyngeal swab, presence of viral mutation(s) within the areas targeted by this assay, and inadequate number of viral copies (<131 copies/mL). A negative result must be combined with clinical observations, patient history, and epidemiological information. The expected result is Negative. Fact Sheet for Patients:  PinkCheek.be Fact Sheet for Healthcare Providers:  GravelBags.it This test is not yet ap proved or cleared by the [place] FDA and  has been authorized for detection and/or diagnosis of SARS-CoV-2 by FDA under an Emergency Use Authorization (EUA). This EUA will remain  in effect (meaning this test can be used) for the duration of the COVID-19 declaration under Section 564(b)(1) of the Act, 21 U.S.C. section 360bbb-3(b)(1), unless the authorization is terminated or revoked sooner.    Influenza A by PCR NEGATIVE NEGATIVE Final   Influenza B by PCR NEGATIVE NEGATIVE Final    Comment: (NOTE) The Xpert Xpress SARS-CoV-2/FLU/RSV assay is intended as an aid in  the diagnosis of influenza from Nasopharyngeal swab specimens and  should not be used as a sole basis for treatment. Nasal washings and  aspirates are unacceptable for Xpert Xpress SARS-CoV-2/FLU/RSV   testing. Fact Sheet for Patients: PinkCheek.be Fact Sheet for Healthcare Providers: GravelBags.it This test is not yet approved or cleared by the [place] FDA and  has been authorized for detection and/or diagnosis of SARS-CoV-2 by  FDA under an Emergency Use Authorization (EUA). This EUA will remain  in effect (meaning this test can be used) for the duration of the  Covid-19  declaration under Section 564(b)(1) of the Act, 21  U.S.C. section 360bbb-3(b)(1), unless the authorization is  terminated or revoked. Performed at [facility], [address], [place], [place] [address]      Radiology Studies: DG Foot 2 Views Left  Result Date: 10/03/2019 CLINICAL DATA:  Left foot swelling EXAM: LEFT FOOT - 2 VIEW COMPARISON:  11/18/2015 FINDINGS: Soft tissue ulceration overlies the lateral foot at the level of the fifth metatarsal base. There is mild diffuse soft tissue swelling. No soft tissue gas. No acute fracture identified. No malalignment. Degenerative changes including a subchondral cyst within the central aspect of the first metatarsal head. 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 IMPRESSION: 1. No acute osseous abnormality. 2. Soft tissue ulceration overlies the lateral foot at the level of the fifth metatarsal base. 3. 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 Electronically Signed   By: Duanne Guess D.O.   On: 10/03/2019 13:19    Scheduled Meds: . ALPRAZolam  1 mg Oral BID  . amLODipine  5 mg Oral Daily  . buprenorphine-naloxone  1 tablet Sublingual TID  . cloNIDine  0.2 mg Oral TID  . enoxaparin (LOVENOX) injection  40 mg Subcutaneous Q24H  . insulin aspart  0-15 Units Subcutaneous TID WC  . insulin aspart  0-5 Units Subcutaneous QHS  . insulin aspart  3 Units Subcutaneous TID WC  . insulin glargine  20 Units  Subcutaneous QHS  . mouth rinse  15 mL Mouth Rinse BID  . pregabalin  150 mg Oral BID  . Ensure Max Protein  11 oz Oral BID   Continuous Infusions: . sodium chloride 125 mL/hr at 10/05/19 1223  .  ceFAZolin (ANCEF) IV Stopped (10/05/19 0606)     LOS: 2 days   Time spent: 40 Minutes.  Arnetha Courser, MD [facility]  If 7PM-7AM, please contact night-coverage [email or web address]  10/05/2019, 12:34 PM   This record has been created using Conservation officer, historic buildings. Errors have been sought and corrected,but may not always be located. Such creation errors do not reflect on the standard of care.

## 2019-10-05 NOTE — Plan of Care (Signed)
Nutrition Education Note   RD consulted for nutrition education regarding diabetes.   64 y.o. male with medical history significant for diabetes mellitus, hypertension, chronically wheelchair-bound, long-term opiate management who presents to the emergency room after he fell out of his wheelchair 3 days prior to his admission   Met with pt in room today. Pt reports good appetite pta and in hospital. Pt reports that he is not getting enough food to eat.   Lab Results  Component Value Date   HGBA1C 9.6 (H) 10/04/2019    RD provided "Nutrition and Type II Diabetes" handout from the Academy of Nutrition and Dietetics. Discussed different food groups and their effects on blood sugar, emphasizing carbohydrate-containing foods. Provided list of carbohydrates and recommended serving sizes of common foods.  Discussed importance of controlled and consistent carbohydrate intake throughout the day. Provided examples of ways to balance meals/snacks and encouraged intake of high-fiber, whole grain complex carbohydrates. Teach back method used.  Expect poor compliance.  Body mass index is 31.07 kg/m. Pt meets criteria for obesity based on current BMI.  Current diet order is HH/CHO patient is consuming approximately 100% of meals at this time. Labs and medications reviewed.   RD will add Ensure Max between meals to help curve patients appetite.   RD will also order crystal light with meals per pt request.   No further nutrition interventions warranted at this time. RD contact information provided. If additional nutrition issues arise, please re-consult RD.  Koleen Distance MS, RD, LDN Please refer to [facility] [place] for RD and/or RD on-call/weekend/after hours pager

## 2019-10-05 NOTE — Plan of Care (Signed)
  Problem: Education: Goal: Knowledge of General Education information will improve Description: Including pain rating scale, medication(s)/side effects and non-pharmacologic comfort measures Outcome: Progressing   Problem: Clinical Measurements: Goal: Ability to maintain clinical measurements within normal limits will improve Outcome: Progressing Goal: Respiratory complications will improve Outcome: Progressing   Problem: Safety: Goal: Ability to remain free from injury will improve Outcome: Progressing   

## 2019-10-05 NOTE — Progress Notes (Signed)
Inpatient Diabetes Program Recommendations  AACE/ADA: New Consensus Statement on Inpatient Glycemic Control (2015)  Target Ranges:  Prepandial:   less than 140 mg/dL      Peak postprandial:   less than 180 mg/dL (1-2 hours)      Critically ill patients:  140 - 180 mg/dL   Lab Results  Component Value Date   GLUCAP 256 (H) 10/05/2019   HGBA1C 9.6 (H) 10/04/2019    Review of Glycemic Control  Results for AZUL, BRUMETT (MRN [id number]) as of 10/05/2019 12:05  Ref. Range 10/04/2019 16:20 10/04/2019 20:39 10/05/2019 07:45 10/05/2019 11:39  Glucose-Capillary Latest Ref Range: 70 - 99 mg/dL 754 (H) 237 (H) 023 (H) 256 (H)    Diabetes history: DM2 Outpatient Diabetes medications: Not taking Current orders for Inpatient glycemic control: Novolog 0-15 tidwc, 0-5 qhs, Lantus 15 units daily  Inpatient Diabetes Program Recommendations:     Please consider, -Lantus 16 units bid -Novolog 3 units tidwc if eats at least 50% of meal  Thank you, Dulce Sellar, RN, BSN Diabetes Coordinator Inpatient Diabetes Program [phone] (team pager from 8a-5p)

## 2019-10-06 LAB — BASIC METABOLIC PANEL
Anion gap: 7 (ref 5–15)
BUN: 8 mg/dL (ref 8–23)
CO2: 28 mmol/L (ref 22–32)
Calcium: 8.3 mg/dL — ABNORMAL LOW (ref 8.9–10.3)
Chloride: 98 mmol/L (ref 98–111)
Creatinine, Ser: 0.63 mg/dL (ref 0.61–1.24)
GFR calc Af Amer: >60 mL/min (ref >60)
GFR calc non Af Amer: >60 mL/min (ref >60)
Glucose, Bld: 314 mg/dL — ABNORMAL HIGH (ref 70–99)
Potassium: 4.4 mmol/L (ref 3.5–5.1)
Sodium: 133 mmol/L — ABNORMAL LOW (ref 135–145)

## 2019-10-06 LAB — GLUCOSE, CAPILLARY
Glucose-Capillary: 347 mg/dL — ABNORMAL HIGH (ref 70–99)
Glucose-Capillary: 141 mg/dL — ABNORMAL HIGH (ref 70–99)

## 2019-10-06 LAB — CK: Total CK: 1388 U/L — ABNORMAL HIGH (ref 49–397)

## 2019-10-06 MED ORDER — LANTUS SOLOSTAR 100 UNIT/ML ~~LOC~~ SOPN: 20.0000 [IU] | PEN_INJECTOR | Freq: Every day | SUBCUTANEOUS | 3 refills | Status: DC

## 2019-10-06 MED ORDER — CEPHALEXIN 500 MG PO CAPS: 500.0000 mg | ORAL_CAPSULE | Freq: Three times a day (TID) | ORAL | 0 refills | Status: AC

## 2019-10-06 MED ORDER — PEN NEEDLES 31G X 5 MM MISC: 20.0000 [IU] | Freq: Every day | 3 refills | Status: AC

## 2019-10-06 MED ORDER — ALPRAZOLAM 1 MG PO TABS: 1.0000 mg | ORAL_TABLET | Freq: Two times a day (BID) | ORAL | 0 refills | Status: AC

## 2019-10-06 NOTE — Progress Notes (Signed)
PT Cancellation Note  Patient Details Name: Joshua Gordon MRN: [id number] DOB: 1955-11-09   Cancelled Treatment:    Reason Eval/Treat Not Completed: Other (comment). Pt in transport chair visiting wife in room 254. He politely refuses PT this date reporting he is hopeful for discharge today and wants to rest. Will re-attempt next available date.   Leslie Jester 10/06/2019, 9:48 AM  Elizabeth Palau, PT, DPT [phone]

## 2019-10-06 NOTE — TOC Transition Note (Addendum)
Transition of Care [facility]) - CM/SW Discharge Note   Patient Details  Name: Joshua Gordon MRN: [id number] Date of Birth: 01/04/1956  [facility] CM/SW Contact:  Maree Krabbe, LCSW Phone Number: 10/06/2019, 2:29 PM   Clinical Narrative:   Pt's HH has been arranged [facility] with start date of 4/27--MD aware and agreeable. Pt told RN he would get him and his spouse a ride home.   Final next level of care: Home w Home Health Services Barriers to Discharge: No Barriers Identified   Patient Goals and CMS Choice Patient states their goals for this hospitalization and ongoing recovery are:: To return home to wife and cat CMS Medicare.gov Compare Post Acute Care list provided to:: Patient Choice offered to / list presented to : Patient  Discharge Placement                Patient to be transferred to facility by: [facility]   Patient and family notified of of transfer: 10/06/19  Discharge Plan and Services   Discharge Planning Services: CM Consult Post Acute Care Choice: Home Health                    HH Arranged: PT, OT, RN, Nurse's Aide, Social Work [employer] Agency: [facility] Date HH Agency Contacted: 10/06/19 Time HH Agency Contacted: 1429 Representative spoke with at [facility] Agency: Barbara Cower  Social Determinants of Health (SDOH) Interventions     Readmission Risk Interventions No flowsheet data found.

## 2019-10-06 NOTE — Discharge Summary (Signed)
Physician Discharge Summary  Joshua Gordon YHC:[id number] DOB: 11/12/1955 DOA: 10/03/2019  PCP: Pearson Grippe, MD  Admit date: 10/03/2019 Discharge date: 10/06/2019  Admitted From: Home  Disposition:  Home  Recommendations for Outpatient Follow-up:  1. Follow up with PCP in 1-2 weeks 2. Please obtain BMP/CBC in one week 3. Please obtain CK level in 1 week 4. Please follow up on the following pending results:None  Home Health:Yes Equipment/Devices: Wheelchair Discharge Condition: Stable CODE STATUS: Full Diet recommendation: Heart Healthy / Carb Modified   Brief/Interim Summary: Joshua Haile Longshoreis a 63 y.o.malewith medical history significant fordiabetes mellitus, hypertension,chronically wheelchair-bound, long-term opiate management whopresents to the emergency room after he fell out of his wheelchair 3 daysprior to his admission thank you thank you is a sweet thank you this is going to respiratory good thank youand was too weak to get up off the floor. Per patient he has not had anything to eat in 3 days and was found this morning by medical transport service that takes him to his chronic pain clinic. Upon arrival to the emergency room he had a low-grade temp with a T-max of 100.5 and he was tachycardic at 112.Lactic acid was1.4. Patient had a left foot x-ray which showedno acute osseous abnormality. Soft tissue ulceration overlies the lateral foot at the level of the fifth metatarsal base. 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 Chest x-ray showed no acute findings and urinalysis was sterile Labs revealed an elevated total CK level greater than 5000.  Admitted for rhabdomyolysis.  His CK improved with IV fluids and it was 1388 on the day of discharge.  He was advised to keep himself well-hydrated and follow-up with primary care physician in 1 week for repeat labs.  There was some concern of bilateral lower extremity cellulitis due to her low-grade  fever and lower extremity wounds.  He was treated with Ancef during hospitalization and discharged on Keflex to complete a 7-day course.  He has uncontrolled diabetes with hyperglycemia and A1c of 9.6.  Please stop taking Metformin at home stating that it does not work.  Advised to restart Metformin and also added Lantus 20 units at bedtime.  He needs a close follow-up with primary care physician for further management of his diabetes.  He will continue his amlodipine.  Discharge Diagnoses:  Principal Problem:   Rhabdomyolysis Active Problems:   Hypertension   Diabetes mellitus without complication (HCC)   Chronic pain   Cellulitis   Generalized weakness   Discharge Instructions  Discharge Instructions    Diet - low sodium heart healthy   Complete by: As directed    Discharge instructions   Complete by: As directed    It was pleasure taking care of you. Keep yourself well-hydrated and follow-up with your primary care physician in 1 week for labs. If you have a prescription for antibiotics to be used for 3 more days for your skin infection. Your blood sugar was very high please restart your Metformin, I am also giving you a prescription of Lantus use it as directed at bedtime and follow-up with your primary care physician for further management.   Increase activity slowly   Complete by: As directed    Increase activity slowly   Complete by: As directed      Allergies as of 10/06/2019   No Known Allergies     Medication List    STOP taking these medications   potassium chloride 10 MEQ tablet Commonly known as: KLOR-CON  TAKE these medications   ALPRAZolam 1 MG tablet Commonly known as: XANAX Take 1 tablet (1 mg total) by mouth 2 (two) times daily.   amLODipine 5 MG tablet Commonly known as: NORVASC Take 5 mg by mouth daily.   cephALEXin 500 MG capsule Commonly known as: KEFLEX Take 1 capsule (500 mg total) by mouth 3 (three) times daily for 3 days.    cloNIDine 0.2 MG tablet Commonly known as: CATAPRES Take 0.2 mg by mouth 3 (three) times daily. What changed: Another medication with the same name was removed. Continue taking this medication, and follow the directions you see here.   Lantus SoloStar 100 UNIT/ML Solostar Pen Generic drug: insulin glargine Inject 20 Units into the skin daily at 10 pm.   Lyrica 150 MG capsule Generic drug: pregabalin Take 1 capsule (150 mg total) by mouth 2 (two) times daily. What changed: how much to take   metFORMIN 1000 MG tablet Commonly known as: GLUCOPHAGE Take 1 tablet (1,000 mg total) by mouth 2 (two) times daily.   Pen Needles 31G X 5 MM Misc 20 Units by Does not apply route at bedtime.   Suboxone 8-2 MG Film Generic drug: Buprenorphine HCl-Naloxone HCl Place 2 mg under the tongue 3 (three) times daily.       No Known Allergies  Consultations:  None  Procedures/Studies: CT HEAD WO CONTRAST  Result Date: 10/03/2019 CLINICAL DATA:  Larey Seat. EXAM: CT HEAD WITHOUT CONTRAST TECHNIQUE: Contiguous axial images were obtained from the base of the skull through the vertex without intravenous contrast. COMPARISON:  04/05/2013 FINDINGS: Brain: Progressive cerebral atrophy, ventriculomegaly and periventricular white matter disease. No extra-axial fluid collections are identified. No CT findings for acute hemispheric infarction or intracranial hemorrhage. No mass lesions. The brainstem and cerebellum are normal. Vascular: Vascular calcifications but no aneurysm or hyperdense vessels. Skull: No skull fracture or bone lesions. Sinuses/Orbits: The paranasal sinuses and mastoid air cells are clear. The globes are intact. Other: No scalp lesions or scalp hematoma. IMPRESSION: 1. Progressive cerebral atrophy, ventriculomegaly and periventricular white matter disease. 2. No acute intracranial findings or skull fracture. Electronically Signed   By: Rudie Meyer M.D.   On: 10/03/2019 11:53   DG Chest Port 1  View  Result Date: 10/03/2019 CLINICAL DATA:  Fever, confusion, weakness EXAM: PORTABLE CHEST 1 VIEW COMPARISON:  Radiograph 04/02/2013 FINDINGS: Normal mediastinum and cardiac silhouette. Chronic central bronchitic markings. Normal pulmonary vasculature. No effusion, infiltrate, or pneumothorax. Rounded calcific density projects over the body of the RIGHT scapula. IMPRESSION: No acute cardiopulmonary process. Rounded calcific density projecting over the RIGHT scapula is favored represent a loose body associated with the glenohumeral joint versus a benign bone lesion of the scapula. Electronically Signed   By: Genevive Bi M.D.   On: 10/03/2019 12:17   DG Foot 2 Views Left  Result Date: 10/03/2019 CLINICAL DATA:  Left foot swelling EXAM: LEFT FOOT - 2 VIEW COMPARISON:  11/18/2015 FINDINGS: Soft tissue ulceration overlies the lateral foot at the level of the fifth metatarsal base. There is mild diffuse soft tissue swelling. No soft tissue gas. No acute fracture identified. No malalignment. Degenerative changes including a subchondral cyst within the central aspect of the first metatarsal head. 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 IMPRESSION: 1. No acute osseous abnormality. 2. Soft tissue ulceration overlies the lateral foot at the level of the fifth metatarsal base. 3. 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 Electronically Signed  By: Duanne GuessNicholas  Plundo D.O.   On: 10/03/2019 13:19    Subjective: Patient has no new complaints.  He was eager to go home.  Discharge Exam: Vitals:   10/06/19 0427 10/06/19 1042  BP: 116/60 131/81  Pulse: 79 82  Resp: 16 17  Temp: 97.8 F (36.6 C) 98.5 F (36.9 C)  SpO2: 93% 92%   Vitals:   10/05/19 1540 10/05/19 1924 10/06/19 0427 10/06/19 1042  BP: 117/66 123/70 116/60 131/81  Pulse: 73 77 79 82  Resp: 18 18 16 17   Temp: 98 F (36.7 C) 98.1 F (36.7 C) 97.8 F  (36.6 C) 98.5 F (36.9 C)  TempSrc: Oral Oral Oral Oral  SpO2: 97% 99% 93% 92%  Weight:   (P) 108.4 kg   Height:        General: Pt is alert, awake, not in acute distress Cardiovascular: RRR, S1/S2 +, no rubs, no gallops Respiratory: CTA bilaterally, no wheezing, no rhonchi Abdominal: Soft, NT, ND, bowel sounds + Extremities: Bilateral lower extremity multiple skin lesions with tophi and ulceration left foot with contractures.  The results of significant diagnostics from this hospitalization (including imaging, microbiology, ancillary and laboratory) are listed below for reference.    Microbiology: Recent Results (from the past 240 hour(s))  Blood Culture (routine x 2)     Status: None (Preliminary result)   Collection Time: 10/03/19 10:36 AM   Specimen: BLOOD  Result Value Ref Range Status   Specimen Description BLOOD RIGHT HAND  Final   Special Requests   Final    BOTTLES DRAWN AEROBIC AND ANAEROBIC Blood Culture adequate volume   Culture   Final    NO GROWTH 3 DAYS Performed at [facility], [address] [place], [place] [address]    Report Status PENDING  Incomplete  Blood Culture (routine x 2)     Status: None (Preliminary result)   Collection Time: 10/03/19 11:15 AM   Specimen: BLOOD  Result Value Ref Range Status   Specimen Description BLOOD LEFT ASSIST CONTROL  Final   Special Requests   Final    BOTTLES DRAWN AEROBIC AND ANAEROBIC Blood Culture adequate volume   Culture   Final    NO GROWTH 3 DAYS Performed at [facility], [address] [place], [place] [address]    Report Status PENDING  Incomplete  Urine culture     Status: None   Collection Time: 10/03/19 11:59 AM   Specimen: Urine, Random  Result Value Ref Range Status   Specimen Description   Final    URINE, RANDOM Performed at [facility], [address] [place], [place] [address]    Special Requests   Final    NONE Performed at [facility], [address] [place], [place] [address]    Culture   Final    NO GROWTH Performed at [facility], [address] [place], [place] [address]    Report Status 10/04/2019 FINAL  Final  Respiratory Panel by RT PCR (Flu A&B, Covid) -     Status: None   Collection Time: 10/03/19 11:59 AM  Result Value Ref Range Status   SARS Coronavirus 2 by RT PCR NEGATIVE NEGATIVE Final    Comment: (NOTE) SARS-CoV-2 target nucleic acids are NOT DETECTED. The SARS-CoV-2 RNA is generally detectable in upper respiratoy specimens during the acute phase of infection. The lowest concentration of SARS-CoV-2 viral copies this assay can detect is 131 copies/mL. A negative result does not  preclude SARS-Cov-2 infection and should not be used as the sole basis for treatment or other patient management decisions. A negative result may occur with  improper specimen collection/handling, submission of specimen other than nasopharyngeal swab, presence of viral mutation(s) within the areas targeted by this assay, and inadequate number of viral copies (<131 copies/mL). A negative result must be combined with clinical observations, patient history, and epidemiological information. The expected result is Negative. Fact Sheet for Patients:  https://www.moore.com/ Fact Sheet for Healthcare Providers:  https://www.young.biz/ This test is not yet ap proved or cleared by the [place] FDA and  has been authorized for detection and/or diagnosis of SARS-CoV-2 by FDA under an Emergency Use Authorization (EUA). This EUA will remain  in effect (meaning this test can be used) for the duration of the COVID-19 declaration under Section 564(b)(1) of the Act, 21 U.S.C. section 360bbb-3(b)(1), unless the authorization is terminated or revoked sooner.    Influenza A by PCR NEGATIVE NEGATIVE Final   Influenza B by PCR NEGATIVE NEGATIVE Final    Comment: (NOTE) The Xpert Xpress  SARS-CoV-2/FLU/RSV assay is intended as an aid in  the diagnosis of influenza from Nasopharyngeal swab specimens and  should not be used as a sole basis for treatment. Nasal washings and  aspirates are unacceptable for Xpert Xpress SARS-CoV-2/FLU/RSV  testing. Fact Sheet for Patients: https://www.moore.com/ Fact Sheet for Healthcare Providers: https://www.young.biz/ This test is not yet approved or cleared by the [place] FDA and  has been authorized for detection and/or diagnosis of SARS-CoV-2 by  FDA under an Emergency Use Authorization (EUA). This EUA will remain  in effect (meaning this test can be used) for the duration of the  Covid-19 declaration under Section 564(b)(1) of the Act, 21  U.S.C. section 360bbb-3(b)(1), unless the authorization is  terminated or revoked. Performed at [facility], [address] [place], [place] [address]      Labs: BNP (last 3 results) No results for input(s): BNP in the last 8760 hours. Basic Metabolic Panel: Recent Labs  Lab 10/03/19 1036 10/04/19 0519 10/05/19 0558 10/06/19 0532  NA 134* 131* 135 133*  K 3.6 3.4* 4.2 4.4  CL 99 98 102 98  CO2 24 27 27 28   GLUCOSE 253* 340* 318* 314*  BUN 7* 11 8 8   CREATININE 0.79 0.86 0.77 0.63  CALCIUM 9.2 7.9* 8.1* 8.3*  MG  --  1.8  --   --    Liver Function Tests: Recent Labs  Lab 10/03/19 1036  AST 92*  ALT 26  ALKPHOS 80  BILITOT 1.2  PROT 8.3*  ALBUMIN 3.9   No results for input(s): LIPASE, AMYLASE in the last 168 hours. No results for input(s): AMMONIA in the last 168 hours. CBC: Recent Labs  Lab 10/03/19 1036 10/04/19 0519 10/05/19 0558  WBC 11.6* 8.2 5.7  NEUTROABS 9.5*  --   --   HGB 14.1 11.4* 10.5*  HCT 43.9 35.6* 32.9*  MCV 82.4 83.6 82.3  PLT 142* 119* 112*   Cardiac Enzymes: Recent Labs  Lab 10/03/19 1036 10/04/19 0519 10/05/19 0558 10/06/19 0532  CKTOTAL 5,771* 4,268* 2,746* 1,388*   BNP: Invalid  input(s): POCBNP CBG: Recent Labs  Lab 10/05/19 0745 10/05/19 1139 10/05/19 1645 10/05/19 2201 10/06/19 0902  GLUCAP 327* 256* 191* 234* 347*   D-Dimer No results for input(s): DDIMER in the last 72 hours. Hgb A1c Recent Labs    10/04/19 0519  HGBA1C 9.6*   Lipid Profile No results for input(s):  CHOL, HDL, LDLCALC, TRIG, CHOLHDL, LDLDIRECT in the last 72 hours. Thyroid function studies No results for input(s): TSH, T4TOTAL, T3FREE, THYROIDAB in the last 72 hours.  Invalid input(s): FREET3 Anemia work up No results for input(s): VITAMINB12, FOLATE, FERRITIN, TIBC, IRON, RETICCTPCT in the last 72 hours. Urinalysis    Component Value Date/Time   COLORURINE YELLOW (A) 10/03/2019 1159   APPEARANCEUR CLEAR (A) 10/03/2019 1159   APPEARANCEUR Clear 07/16/2014 1538   LABSPEC 1.021 10/03/2019 1159   LABSPEC 1.028 07/16/2014 1538   PHURINE 5.0 10/03/2019 1159   GLUCOSEU >=500 (A) 10/03/2019 1159   GLUCOSEU >=500 07/16/2014 1538   HGBUR MODERATE (A) 10/03/2019 1159   BILIRUBINUR NEGATIVE 10/03/2019 1159   BILIRUBINUR Negative 07/16/2014 1538   KETONESUR NEGATIVE 10/03/2019 1159   PROTEINUR 100 (A) 10/03/2019 1159   UROBILINOGEN 4.0 (H) 03/17/2013 2349   NITRITE NEGATIVE 10/03/2019 1159   LEUKOCYTESUR NEGATIVE 10/03/2019 1159   LEUKOCYTESUR Negative 07/16/2014 1538   Sepsis Labs Invalid input(s): PROCALCITONIN,  WBC,  LACTICIDVEN Microbiology Recent Results (from the past 240 hour(s))  Blood Culture (routine x 2)     Status: None (Preliminary result)   Collection Time: 10/03/19 10:36 AM   Specimen: BLOOD  Result Value Ref Range Status   Specimen Description BLOOD RIGHT HAND  Final   Special Requests   Final    BOTTLES DRAWN AEROBIC AND ANAEROBIC Blood Culture adequate volume   Culture   Final    NO GROWTH 3 DAYS Performed at [facility], [address], [place], [place] [address]    Report Status PENDING  Incomplete  Blood Culture (routine x 2)      Status: None (Preliminary result)   Collection Time: 10/03/19 11:15 AM   Specimen: BLOOD  Result Value Ref Range Status   Specimen Description BLOOD LEFT ASSIST CONTROL  Final   Special Requests   Final    BOTTLES DRAWN AEROBIC AND ANAEROBIC Blood Culture adequate volume   Culture   Final    NO GROWTH 3 DAYS Performed at [facility], [address] [place], [place] [address]    Report Status PENDING  Incomplete  Urine culture     Status: None   Collection Time: 10/03/19 11:59 AM   Specimen: Urine, Random  Result Value Ref Range Status   Specimen Description   Final    URINE, RANDOM Performed at [facility], [address] [place], [place] [address]    Special Requests   Final    NONE Performed at [facility], [address], [place], [place] [address]    Culture   Final    NO GROWTH Performed at [facility], [place] [address], [place], [place] [address]    Report Status 10/04/2019 FINAL  Final  Respiratory Panel by RT PCR (Flu A&B, Covid) -     Status: None   Collection Time: 10/03/19 11:59 AM  Result Value Ref Range Status   SARS Coronavirus 2 by RT PCR NEGATIVE NEGATIVE Final    Comment: (NOTE) SARS-CoV-2 target nucleic acids are NOT DETECTED. The SARS-CoV-2 RNA is generally detectable in upper respiratoy specimens during the acute phase of infection. The lowest concentration of SARS-CoV-2 viral copies this assay can detect is 131 copies/mL. A negative result does not preclude SARS-Cov-2 infection and should not be used as the sole basis for treatment or other patient management decisions. A negative result may occur with  improper specimen collection/handling, submission of specimen other than nasopharyngeal  swab, presence of viral mutation(s) within the areas targeted by this assay, and inadequate number of viral copies (<131 copies/mL). A negative result must be combined with clinical observations, patient history, and  epidemiological information. The expected result is Negative. Fact Sheet for Patients:  https://www.moore.com/ Fact Sheet for Healthcare Providers:  https://www.young.biz/ This test is not yet ap proved or cleared by the [place] FDA and  has been authorized for detection and/or diagnosis of SARS-CoV-2 by FDA under an Emergency Use Authorization (EUA). This EUA will remain  in effect (meaning this test can be used) for the duration of the COVID-19 declaration under Section 564(b)(1) of the Act, 21 U.S.C. section 360bbb-3(b)(1), unless the authorization is terminated or revoked sooner.    Influenza A by PCR NEGATIVE NEGATIVE Final   Influenza B by PCR NEGATIVE NEGATIVE Final    Comment: (NOTE) The Xpert Xpress SARS-CoV-2/FLU/RSV assay is intended as an aid in  the diagnosis of influenza from Nasopharyngeal swab specimens and  should not be used as a sole basis for treatment. Nasal washings and  aspirates are unacceptable for Xpert Xpress SARS-CoV-2/FLU/RSV  testing. Fact Sheet for Patients: https://www.moore.com/ Fact Sheet for Healthcare Providers: https://www.young.biz/ This test is not yet approved or cleared by the [place] FDA and  has been authorized for detection and/or diagnosis of SARS-CoV-2 by  FDA under an Emergency Use Authorization (EUA). This EUA will remain  in effect (meaning this test can be used) for the duration of the  Covid-19 declaration under Section 564(b)(1) of the Act, 21  U.S.C. section 360bbb-3(b)(1), unless the authorization is  terminated or revoked. Performed at [facility], [address] [place], [place] [address]     Time coordinating discharge: Over 30 minutes  SIGNED:  Arnetha Courser, MD  [facility] 10/06/2019, 11:17 AM  If 7PM-7AM, please contact night-coverage [email or web address]  This record has been created using Software engineer. Errors have been sought and corrected,but may not always be located. Such creation errors do not reflect on the standard of care.

## 2019-10-06 NOTE — Care Management Important Message (Signed)
Important Message  Patient Details  Name: Joshua Gordon MRN: [id number] Date of Birth: 03/23/1956   Medicare Important Message Given:  Yes     Johnell Comings 10/06/2019, 12:59 PM

## 2019-10-08 LAB — CULTURE, BLOOD (ROUTINE X 2)
Culture: NO GROWTH
Culture: NO GROWTH
Special Requests: ADEQUATE
Special Requests: ADEQUATE

## 2019-10-09 LAB — BLOOD GAS, VENOUS
Acid-Base Excess: 8.2 mmol/L — ABNORMAL HIGH (ref 0.0–2.0)
Bicarbonate: 35.5 mmol/L — ABNORMAL HIGH (ref 20.0–28.0)
O2 Saturation: 35.9 %
Patient temperature: 37
pCO2, Ven: 60 mmHg (ref 44.0–60.0)
pH, Ven: 7.38 (ref 7.250–7.430)

## 2019-11-01 DIAGNOSIS — E1140 Type 2 diabetes mellitus with diabetic neuropathy, unspecified: Secondary | ICD-10-CM | POA: Diagnosis not present

## 2019-11-01 DIAGNOSIS — E7800 Pure hypercholesterolemia, unspecified: Secondary | ICD-10-CM | POA: Diagnosis not present

## 2019-11-01 DIAGNOSIS — I10 Essential (primary) hypertension: Secondary | ICD-10-CM | POA: Diagnosis not present

## 2019-11-01 DIAGNOSIS — G629 Polyneuropathy, unspecified: Secondary | ICD-10-CM | POA: Diagnosis not present

## 2019-11-22 DIAGNOSIS — E1140 Type 2 diabetes mellitus with diabetic neuropathy, unspecified: Secondary | ICD-10-CM | POA: Diagnosis not present

## 2019-11-22 DIAGNOSIS — F17210 Nicotine dependence, cigarettes, uncomplicated: Secondary | ICD-10-CM | POA: Diagnosis not present

## 2019-11-22 DIAGNOSIS — I10 Essential (primary) hypertension: Secondary | ICD-10-CM | POA: Diagnosis not present

## 2019-11-29 DIAGNOSIS — E1140 Type 2 diabetes mellitus with diabetic neuropathy, unspecified: Secondary | ICD-10-CM | POA: Diagnosis not present

## 2019-11-29 DIAGNOSIS — I10 Essential (primary) hypertension: Secondary | ICD-10-CM | POA: Diagnosis not present

## 2019-11-29 DIAGNOSIS — R52 Pain, unspecified: Secondary | ICD-10-CM | POA: Diagnosis not present

## 2019-11-29 DIAGNOSIS — Z79899 Other long term (current) drug therapy: Secondary | ICD-10-CM | POA: Diagnosis not present

## 2019-12-07 DIAGNOSIS — Z79899 Other long term (current) drug therapy: Secondary | ICD-10-CM | POA: Diagnosis not present

## 2019-12-14 DIAGNOSIS — M79606 Pain in leg, unspecified: Secondary | ICD-10-CM | POA: Diagnosis not present

## 2019-12-14 DIAGNOSIS — Z79899 Other long term (current) drug therapy: Secondary | ICD-10-CM | POA: Diagnosis not present

## 2019-12-14 DIAGNOSIS — R52 Pain, unspecified: Secondary | ICD-10-CM | POA: Diagnosis not present

## 2019-12-21 DIAGNOSIS — R52 Pain, unspecified: Secondary | ICD-10-CM | POA: Diagnosis not present

## 2019-12-21 DIAGNOSIS — Z79899 Other long term (current) drug therapy: Secondary | ICD-10-CM | POA: Diagnosis not present

## 2019-12-21 DIAGNOSIS — M79606 Pain in leg, unspecified: Secondary | ICD-10-CM | POA: Diagnosis not present

## 2019-12-28 DIAGNOSIS — R52 Pain, unspecified: Secondary | ICD-10-CM | POA: Diagnosis not present

## 2019-12-28 DIAGNOSIS — M79606 Pain in leg, unspecified: Secondary | ICD-10-CM | POA: Diagnosis not present

## 2019-12-28 DIAGNOSIS — Z79899 Other long term (current) drug therapy: Secondary | ICD-10-CM | POA: Diagnosis not present

## 2020-01-04 DIAGNOSIS — Z79899 Other long term (current) drug therapy: Secondary | ICD-10-CM | POA: Diagnosis not present

## 2020-01-06 ENCOUNTER — Other Ambulatory Visit: Payer: Self-pay

## 2020-01-06 ENCOUNTER — Emergency Department
Admission: EM | Admit: 2020-01-06 | Discharge: 2020-01-06 | Disposition: A | Discharge status: Home / Self Care | Payer: Medicare Other | Attending: Emergency Medicine | Admitting: Emergency Medicine

## 2020-01-06 ENCOUNTER — Encounter: Payer: Self-pay | Admitting: Intensive Care

## 2020-01-06 DIAGNOSIS — R109 Unspecified abdominal pain: Secondary | ICD-10-CM | POA: Diagnosis not present

## 2020-01-06 DIAGNOSIS — R6889 Other general symptoms and signs: Secondary | ICD-10-CM | POA: Diagnosis not present

## 2020-01-06 DIAGNOSIS — R1110 Vomiting, unspecified: Secondary | ICD-10-CM | POA: Diagnosis not present

## 2020-01-06 DIAGNOSIS — R609 Edema, unspecified: Secondary | ICD-10-CM | POA: Diagnosis not present

## 2020-01-06 DIAGNOSIS — Z5321 Procedure and treatment not carried out due to patient leaving prior to being seen by health care provider: Secondary | ICD-10-CM | POA: Insufficient documentation

## 2020-01-06 DIAGNOSIS — Z743 Need for continuous supervision: Secondary | ICD-10-CM | POA: Diagnosis not present

## 2020-01-06 DIAGNOSIS — R0902 Hypoxemia: Secondary | ICD-10-CM | POA: Diagnosis not present

## 2020-01-06 LAB — URINALYSIS, COMPLETE (UACMP) WITH MICROSCOPIC
Bacteria, UA: NONE SEEN
Bilirubin Urine: NEGATIVE
Glucose, UA: NEGATIVE mg/dL
Hgb urine dipstick: NEGATIVE
Ketones, ur: 5 mg/dL — AB
Leukocytes,Ua: NEGATIVE
Nitrite: NEGATIVE
Protein, ur: 30 mg/dL — AB
Specific Gravity, Urine: 1.019 (ref 1.005–1.030)
pH: 5 (ref 5.0–8.0)

## 2020-01-06 MED ORDER — ONDANSETRON 4 MG PO TBDP
4.0000 mg | ORAL_TABLET | Freq: Once | ORAL | Status: AC | PRN
  Administered 2020-01-06: 4 mg via ORAL
  Filled 2020-01-06: qty 1

## 2020-01-06 NOTE — ED Notes (Signed)
Lab was unable to get blood from pt.

## 2020-01-06 NOTE — ED Triage Notes (Signed)
Patient arrived by EMS from home with c/o abdominal pain with emesis that started at 5am today. Denies diarrhea.

## 2020-01-06 NOTE — ED Notes (Addendum)
While attempting to draw blood pt informed me he had not take his suboxone today and if he could take it now. Due to coming in for abdominal and emesis I double checked with Dr. Marisa Severin who said it was ok for pt to take.

## 2020-01-06 NOTE — ED Notes (Signed)
First Nurse Note: Pt to ED via [facility] from home for vomiting since 0500. Pt also c/o 7/10 abd pain.  CBG 189 BP 178/100  Pt is in NAD.

## 2020-01-06 NOTE — ED Notes (Signed)
Lab attempting to draw blood at this time 

## 2020-01-08 ENCOUNTER — Telehealth: Payer: Self-pay | Admitting: Emergency Medicine

## 2020-01-08 NOTE — Telephone Encounter (Signed)
Called patient due to lwot to inquire about condition and follow up plans.  No answer and no voicemail  

## 2020-01-12 DIAGNOSIS — R05 Cough: Secondary | ICD-10-CM | POA: Diagnosis not present

## 2020-01-18 DIAGNOSIS — F17210 Nicotine dependence, cigarettes, uncomplicated: Secondary | ICD-10-CM | POA: Diagnosis not present

## 2020-01-18 DIAGNOSIS — I10 Essential (primary) hypertension: Secondary | ICD-10-CM | POA: Diagnosis not present

## 2020-01-18 DIAGNOSIS — E1140 Type 2 diabetes mellitus with diabetic neuropathy, unspecified: Secondary | ICD-10-CM | POA: Diagnosis not present

## 2020-01-25 DIAGNOSIS — Z79899 Other long term (current) drug therapy: Secondary | ICD-10-CM | POA: Diagnosis not present

## 2020-01-26 DIAGNOSIS — Z79899 Other long term (current) drug therapy: Secondary | ICD-10-CM | POA: Diagnosis not present

## 2020-01-26 DIAGNOSIS — F17210 Nicotine dependence, cigarettes, uncomplicated: Secondary | ICD-10-CM | POA: Diagnosis not present

## 2020-02-01 DIAGNOSIS — E1140 Type 2 diabetes mellitus with diabetic neuropathy, unspecified: Secondary | ICD-10-CM | POA: Diagnosis not present

## 2020-02-01 DIAGNOSIS — I10 Essential (primary) hypertension: Secondary | ICD-10-CM | POA: Diagnosis not present

## 2020-02-01 DIAGNOSIS — Z79899 Other long term (current) drug therapy: Secondary | ICD-10-CM | POA: Diagnosis not present

## 2020-02-08 DIAGNOSIS — Z79899 Other long term (current) drug therapy: Secondary | ICD-10-CM | POA: Diagnosis not present

## 2020-02-08 DIAGNOSIS — E1140 Type 2 diabetes mellitus with diabetic neuropathy, unspecified: Secondary | ICD-10-CM | POA: Diagnosis not present

## 2020-02-08 DIAGNOSIS — F17210 Nicotine dependence, cigarettes, uncomplicated: Secondary | ICD-10-CM | POA: Diagnosis not present

## 2020-02-15 DIAGNOSIS — I10 Essential (primary) hypertension: Secondary | ICD-10-CM | POA: Diagnosis not present

## 2020-02-15 DIAGNOSIS — F17210 Nicotine dependence, cigarettes, uncomplicated: Secondary | ICD-10-CM | POA: Diagnosis not present

## 2020-02-15 DIAGNOSIS — E1140 Type 2 diabetes mellitus with diabetic neuropathy, unspecified: Secondary | ICD-10-CM | POA: Diagnosis not present

## 2020-02-15 DIAGNOSIS — Z79899 Other long term (current) drug therapy: Secondary | ICD-10-CM | POA: Diagnosis not present

## 2020-02-22 DIAGNOSIS — Z79899 Other long term (current) drug therapy: Secondary | ICD-10-CM | POA: Diagnosis not present

## 2020-02-22 DIAGNOSIS — I10 Essential (primary) hypertension: Secondary | ICD-10-CM | POA: Diagnosis not present

## 2020-02-22 DIAGNOSIS — E1140 Type 2 diabetes mellitus with diabetic neuropathy, unspecified: Secondary | ICD-10-CM | POA: Diagnosis not present

## 2020-02-29 DIAGNOSIS — I10 Essential (primary) hypertension: Secondary | ICD-10-CM | POA: Diagnosis not present

## 2020-02-29 DIAGNOSIS — Z79899 Other long term (current) drug therapy: Secondary | ICD-10-CM | POA: Diagnosis not present

## 2020-02-29 DIAGNOSIS — E1140 Type 2 diabetes mellitus with diabetic neuropathy, unspecified: Secondary | ICD-10-CM | POA: Diagnosis not present

## 2020-03-07 DIAGNOSIS — Z79899 Other long term (current) drug therapy: Secondary | ICD-10-CM | POA: Diagnosis not present

## 2020-03-07 DIAGNOSIS — R05 Cough: Secondary | ICD-10-CM | POA: Diagnosis not present

## 2020-03-14 DIAGNOSIS — I10 Essential (primary) hypertension: Secondary | ICD-10-CM | POA: Diagnosis not present

## 2020-03-14 DIAGNOSIS — M79606 Pain in leg, unspecified: Secondary | ICD-10-CM | POA: Diagnosis not present

## 2020-03-14 DIAGNOSIS — E1140 Type 2 diabetes mellitus with diabetic neuropathy, unspecified: Secondary | ICD-10-CM | POA: Diagnosis not present

## 2020-03-14 DIAGNOSIS — Z79899 Other long term (current) drug therapy: Secondary | ICD-10-CM | POA: Diagnosis not present

## 2020-03-18 DIAGNOSIS — E1140 Type 2 diabetes mellitus with diabetic neuropathy, unspecified: Secondary | ICD-10-CM | POA: Diagnosis not present

## 2020-03-18 DIAGNOSIS — I10 Essential (primary) hypertension: Secondary | ICD-10-CM | POA: Diagnosis not present

## 2020-03-21 DIAGNOSIS — M79604 Pain in right leg: Secondary | ICD-10-CM | POA: Diagnosis not present

## 2020-03-21 DIAGNOSIS — K219 Gastro-esophageal reflux disease without esophagitis: Secondary | ICD-10-CM | POA: Diagnosis not present

## 2020-04-05 DIAGNOSIS — F17210 Nicotine dependence, cigarettes, uncomplicated: Secondary | ICD-10-CM | POA: Diagnosis not present

## 2020-04-05 DIAGNOSIS — Z79899 Other long term (current) drug therapy: Secondary | ICD-10-CM | POA: Diagnosis not present

## 2020-04-05 DIAGNOSIS — E1140 Type 2 diabetes mellitus with diabetic neuropathy, unspecified: Secondary | ICD-10-CM | POA: Diagnosis not present

## 2020-04-11 DIAGNOSIS — Z79899 Other long term (current) drug therapy: Secondary | ICD-10-CM | POA: Diagnosis not present

## 2020-04-11 DIAGNOSIS — F17210 Nicotine dependence, cigarettes, uncomplicated: Secondary | ICD-10-CM | POA: Diagnosis not present

## 2020-04-18 DIAGNOSIS — F17210 Nicotine dependence, cigarettes, uncomplicated: Secondary | ICD-10-CM | POA: Diagnosis not present

## 2020-04-18 DIAGNOSIS — I10 Essential (primary) hypertension: Secondary | ICD-10-CM | POA: Diagnosis not present

## 2020-04-19 DIAGNOSIS — R059 Cough, unspecified: Secondary | ICD-10-CM | POA: Diagnosis not present

## 2020-04-19 DIAGNOSIS — F17210 Nicotine dependence, cigarettes, uncomplicated: Secondary | ICD-10-CM | POA: Diagnosis not present

## 2020-04-25 DIAGNOSIS — Z79899 Other long term (current) drug therapy: Secondary | ICD-10-CM | POA: Diagnosis not present

## 2020-04-25 DIAGNOSIS — E1140 Type 2 diabetes mellitus with diabetic neuropathy, unspecified: Secondary | ICD-10-CM | POA: Diagnosis not present

## 2020-05-02 DIAGNOSIS — E1140 Type 2 diabetes mellitus with diabetic neuropathy, unspecified: Secondary | ICD-10-CM | POA: Diagnosis not present

## 2020-05-08 DIAGNOSIS — Z79899 Other long term (current) drug therapy: Secondary | ICD-10-CM | POA: Diagnosis not present

## 2020-05-08 DIAGNOSIS — E1140 Type 2 diabetes mellitus with diabetic neuropathy, unspecified: Secondary | ICD-10-CM | POA: Diagnosis not present

## 2020-05-16 DIAGNOSIS — Z79899 Other long term (current) drug therapy: Secondary | ICD-10-CM | POA: Diagnosis not present

## 2020-05-23 DIAGNOSIS — Z79899 Other long term (current) drug therapy: Secondary | ICD-10-CM | POA: Diagnosis not present

## 2020-05-23 DIAGNOSIS — R519 Headache, unspecified: Secondary | ICD-10-CM | POA: Diagnosis not present

## 2020-05-23 DIAGNOSIS — E1140 Type 2 diabetes mellitus with diabetic neuropathy, unspecified: Secondary | ICD-10-CM | POA: Diagnosis not present

## 2020-05-30 DIAGNOSIS — I10 Essential (primary) hypertension: Secondary | ICD-10-CM | POA: Diagnosis not present

## 2020-05-30 DIAGNOSIS — E1140 Type 2 diabetes mellitus with diabetic neuropathy, unspecified: Secondary | ICD-10-CM | POA: Diagnosis not present

## 2020-06-05 DIAGNOSIS — F17210 Nicotine dependence, cigarettes, uncomplicated: Secondary | ICD-10-CM | POA: Diagnosis not present

## 2020-06-13 DIAGNOSIS — I10 Essential (primary) hypertension: Secondary | ICD-10-CM | POA: Diagnosis not present

## 2020-06-13 DIAGNOSIS — F17210 Nicotine dependence, cigarettes, uncomplicated: Secondary | ICD-10-CM | POA: Diagnosis not present

## 2020-06-13 DIAGNOSIS — Z79899 Other long term (current) drug therapy: Secondary | ICD-10-CM | POA: Diagnosis not present

## 2020-06-20 DIAGNOSIS — I10 Essential (primary) hypertension: Secondary | ICD-10-CM | POA: Diagnosis not present

## 2020-06-20 DIAGNOSIS — L0390 Cellulitis, unspecified: Secondary | ICD-10-CM | POA: Diagnosis not present

## 2020-06-20 DIAGNOSIS — K219 Gastro-esophageal reflux disease without esophagitis: Secondary | ICD-10-CM | POA: Diagnosis not present

## 2020-06-28 DIAGNOSIS — I10 Essential (primary) hypertension: Secondary | ICD-10-CM | POA: Diagnosis not present

## 2020-06-28 DIAGNOSIS — Z79899 Other long term (current) drug therapy: Secondary | ICD-10-CM | POA: Diagnosis not present

## 2020-06-28 DIAGNOSIS — F17210 Nicotine dependence, cigarettes, uncomplicated: Secondary | ICD-10-CM | POA: Diagnosis not present

## 2020-07-04 DIAGNOSIS — I10 Essential (primary) hypertension: Secondary | ICD-10-CM | POA: Diagnosis not present

## 2020-07-04 DIAGNOSIS — E1140 Type 2 diabetes mellitus with diabetic neuropathy, unspecified: Secondary | ICD-10-CM | POA: Diagnosis not present

## 2020-07-11 DIAGNOSIS — F17210 Nicotine dependence, cigarettes, uncomplicated: Secondary | ICD-10-CM | POA: Diagnosis not present

## 2020-07-11 DIAGNOSIS — I10 Essential (primary) hypertension: Secondary | ICD-10-CM | POA: Diagnosis not present

## 2020-07-16 DIAGNOSIS — I10 Essential (primary) hypertension: Secondary | ICD-10-CM | POA: Diagnosis not present

## 2020-07-16 DIAGNOSIS — F17210 Nicotine dependence, cigarettes, uncomplicated: Secondary | ICD-10-CM | POA: Diagnosis not present

## 2020-07-18 DIAGNOSIS — I10 Essential (primary) hypertension: Secondary | ICD-10-CM | POA: Diagnosis not present

## 2020-07-25 DIAGNOSIS — I10 Essential (primary) hypertension: Secondary | ICD-10-CM | POA: Diagnosis not present

## 2020-07-25 DIAGNOSIS — E1140 Type 2 diabetes mellitus with diabetic neuropathy, unspecified: Secondary | ICD-10-CM | POA: Diagnosis not present

## 2020-08-02 DIAGNOSIS — Z79899 Other long term (current) drug therapy: Secondary | ICD-10-CM | POA: Diagnosis not present

## 2020-08-02 DIAGNOSIS — R059 Cough, unspecified: Secondary | ICD-10-CM | POA: Diagnosis not present

## 2020-08-08 DIAGNOSIS — I10 Essential (primary) hypertension: Secondary | ICD-10-CM | POA: Diagnosis not present

## 2020-08-08 DIAGNOSIS — R059 Cough, unspecified: Secondary | ICD-10-CM | POA: Diagnosis not present

## 2020-08-09 ENCOUNTER — Other Ambulatory Visit: Payer: Self-pay

## 2020-08-09 DIAGNOSIS — Z5900 Homelessness unspecified: Secondary | ICD-10-CM | POA: Insufficient documentation

## 2020-08-09 DIAGNOSIS — Z7984 Long term (current) use of oral hypoglycemic drugs: Secondary | ICD-10-CM | POA: Insufficient documentation

## 2020-08-09 DIAGNOSIS — M19072 Primary osteoarthritis, left ankle and foot: Secondary | ICD-10-CM | POA: Diagnosis not present

## 2020-08-09 DIAGNOSIS — M7989 Other specified soft tissue disorders: Secondary | ICD-10-CM | POA: Diagnosis not present

## 2020-08-09 DIAGNOSIS — M79672 Pain in left foot: Secondary | ICD-10-CM | POA: Diagnosis present

## 2020-08-09 DIAGNOSIS — M21962 Unspecified acquired deformity of left lower leg: Secondary | ICD-10-CM | POA: Insufficient documentation

## 2020-08-09 DIAGNOSIS — G8929 Other chronic pain: Secondary | ICD-10-CM | POA: Insufficient documentation

## 2020-08-09 DIAGNOSIS — M216X2 Other acquired deformities of left foot: Secondary | ICD-10-CM | POA: Diagnosis not present

## 2020-08-09 DIAGNOSIS — L97929 Non-pressure chronic ulcer of unspecified part of left lower leg with unspecified severity: Secondary | ICD-10-CM | POA: Diagnosis not present

## 2020-08-09 DIAGNOSIS — Z760 Encounter for issue of repeat prescription: Secondary | ICD-10-CM | POA: Insufficient documentation

## 2020-08-09 DIAGNOSIS — L97509 Non-pressure chronic ulcer of other part of unspecified foot with unspecified severity: Secondary | ICD-10-CM | POA: Diagnosis not present

## 2020-08-09 NOTE — ED Triage Notes (Signed)
Reports bilateral leg and arm pain chronic in nature. Swelling and deformity noted to L leg/ankle, which pt reports is baseline. Presents in wheelchair. Pt found outside by [facility] and brought in for evaluation. Pt reports recent eviction and therefore loss of prescribed medications. Pt tearful in triage. Breathing unlabored and speaking in full sentences.

## 2020-08-10 ENCOUNTER — Emergency Department
Admission: EM | Admit: 2020-08-10 | Discharge: 2020-08-10 | Disposition: A | Discharge status: Home / Self Care | Payer: Medicare Other | Attending: Emergency Medicine | Admitting: Emergency Medicine

## 2020-08-10 ENCOUNTER — Emergency Department: Payer: Medicare Other

## 2020-08-10 DIAGNOSIS — Z760 Encounter for issue of repeat prescription: Secondary | ICD-10-CM

## 2020-08-10 DIAGNOSIS — L97929 Non-pressure chronic ulcer of unspecified part of left lower leg with unspecified severity: Secondary | ICD-10-CM | POA: Diagnosis not present

## 2020-08-10 DIAGNOSIS — M7989 Other specified soft tissue disorders: Secondary | ICD-10-CM | POA: Diagnosis not present

## 2020-08-10 DIAGNOSIS — L97509 Non-pressure chronic ulcer of other part of unspecified foot with unspecified severity: Secondary | ICD-10-CM | POA: Diagnosis not present

## 2020-08-10 DIAGNOSIS — G8929 Other chronic pain: Secondary | ICD-10-CM

## 2020-08-10 DIAGNOSIS — M21962 Unspecified acquired deformity of left lower leg: Secondary | ICD-10-CM

## 2020-08-10 DIAGNOSIS — Z5900 Homelessness unspecified: Secondary | ICD-10-CM

## 2020-08-10 DIAGNOSIS — M19072 Primary osteoarthritis, left ankle and foot: Secondary | ICD-10-CM | POA: Diagnosis not present

## 2020-08-10 LAB — CBG MONITORING, ED: Glucose-Capillary: 227 mg/dL — ABNORMAL HIGH (ref 70–99)

## 2020-08-10 MED ORDER — LANTUS SOLOSTAR 100 UNIT/ML ~~LOC~~ SOPN: 20.0000 [IU] | PEN_INJECTOR | Freq: Every day | SUBCUTANEOUS | 0 refills | Status: AC

## 2020-08-10 MED ORDER — LORAZEPAM 1 MG PO TABS
1.0000 mg | ORAL_TABLET | Freq: Once | ORAL | Status: AC
  Administered 2020-08-10: 1 mg via ORAL
  Filled 2020-08-10: qty 1

## 2020-08-10 MED ORDER — ACETAMINOPHEN 500 MG PO TABS
1000.0000 mg | ORAL_TABLET | Freq: Once | ORAL | Status: AC
  Administered 2020-08-10: 1000 mg via ORAL
  Filled 2020-08-10: qty 2

## 2020-08-10 MED ORDER — PREGABALIN 75 MG PO CAPS
150.0000 mg | ORAL_CAPSULE | ORAL | Status: AC
  Administered 2020-08-10: 150 mg via ORAL
  Filled 2020-08-10: qty 2

## 2020-08-10 MED ORDER — CLONIDINE HCL 0.1 MG PO TABS
0.2000 mg | ORAL_TABLET | Freq: Once | ORAL | Status: AC
  Administered 2020-08-10: 0.2 mg via ORAL
  Filled 2020-08-10: qty 2

## 2020-08-10 MED ORDER — METFORMIN HCL 500 MG PO TABS
1000.0000 mg | ORAL_TABLET | Freq: Once | ORAL | Status: AC
  Administered 2020-08-10: 1000 mg via ORAL
  Filled 2020-08-10: qty 2

## 2020-08-10 MED ORDER — PREGABALIN 75 MG PO CAPS
200.0000 mg | ORAL_CAPSULE | Freq: Every day | ORAL | Status: DC
  Administered 2020-08-10: 200 mg via ORAL
  Filled 2020-08-10: qty 1

## 2020-08-10 MED ORDER — ESOMEPRAZOLE MAGNESIUM 40 MG PO CPDR: 40.0000 mg | DELAYED_RELEASE_CAPSULE | Freq: Every day | ORAL | 0 refills | Status: AC

## 2020-08-10 MED ORDER — PANTOPRAZOLE SODIUM 40 MG PO TBEC
40.0000 mg | DELAYED_RELEASE_TABLET | Freq: Every day | ORAL | Status: DC
  Administered 2020-08-10: 40 mg via ORAL
  Filled 2020-08-10: qty 1

## 2020-08-10 MED ORDER — JANUVIA 50 MG PO TABS: 50.0000 mg | ORAL_TABLET | Freq: Every day | ORAL | 0 refills | Status: AC

## 2020-08-10 MED ORDER — METFORMIN HCL 1000 MG PO TABS: 1000.0000 mg | ORAL_TABLET | Freq: Two times a day (BID) | ORAL | 0 refills | Status: AC

## 2020-08-10 MED ORDER — CLONIDINE HCL 0.2 MG PO TABS: 0.2000 mg | ORAL_TABLET | Freq: Three times a day (TID) | ORAL | 0 refills | Status: DC

## 2020-08-10 NOTE — ED Notes (Signed)
SW here to talk with patient regarding resources.  Pt refusing to talk with SW and wants to talk with MD.  Dr. Fuller Plan to bedside to talk with pt at this time.

## 2020-08-10 NOTE — ED Provider Notes (Signed)
[facility] Emergency Department Provider Note  ____________________________________________   Event Date/Time   First MD Initiated Contact with Patient 08/10/20 0214     (approximate)  I have reviewed the triage vital signs and the nursing notes.   HISTORY  Chief Complaint Homeless, Medication Refill, and Leg Pain  Level 5 caveat: History and review of systems are limited because the patient is a vague and unclear historian.  HPI Joshua Gordon is a 65 y.o. male with medical history as listed below which notably includes a history of polysubstance abuse, chronic pain, and noncompliance with his medical plan of care who presents tonight for several reasons.  His primary reason for coming in seems to be that he claims he was evicted from the place at which he was living and then went to stay with someone else.  He says that "I realize she was crazy" and left that place as well.  He is unclear if this is where his medication was left behind or if he otherwise lost it, but now he says he is concerned because he no longer has his meds.  He specifically names Lyrica but cannot name any of the other medications he is missing except he thinks 1 is for his diabetes.  When I asked more questions about the loss medications he then started telling me about the pain in his left foot.  He is unwilling or unable to tell me how long his foot has been hurting but eventually agreed that this is been a chronic issue for a long time, dating back decades to when he had broken bones that did not heal correctly.  He does not answer when asked if he has had a fever recently but he states that he has not had any chest pain or shortness of breath or abdominal pain.  He has not been throwing up.  He is upset about his situation and stopped talking to me after these details were obtained.         Past Medical History:  Diagnosis Date  . Chronic pain   . Diabetes mellitus without  complication (HCC)   . Hypertension   . Non compliance with medical treatment   . Pain management    contract for pain management  . Polysubstance abuse (HCC)   . Renal disorder     Patient Active Problem List   Diagnosis Date Noted  . Generalized weakness   . Rhabdomyolysis 10/03/2019  . Cellulitis 10/03/2019  . Hypertension   . Diabetes mellitus without complication (HCC)   . Chronic pain     Past Surgical History:  Procedure Laterality Date  . FEMUR FRACTURE SURGERY Left   . KNEE SURGERY Left 1991   5 times  . REPLACEMENT TOTAL KNEE Right    twice    Prior to Admission medications   Medication Sig Start Date End Date Taking? Authorizing Provider  ALPRAZolam Prudy Feeler) 1 MG tablet Take 1 tablet (1 mg total) by mouth 2 (two) times daily. 10/06/19  Yes Arnetha Courser, MD  cloNIDine (CATAPRES) 0.2 MG tablet Take 0.2 mg by mouth 3 (three) times daily. 09/12/19  Yes [provider]  esomeprazole (NEXIUM) 40 MG capsule Take 40 mg by mouth daily. 07/13/20  Yes [provider]  insulin glargine (LANTUS SOLOSTAR) 100 UNIT/ML Solostar Pen Inject 20 Units into the skin daily at 10 pm. 10/06/19  Yes Arnetha Courser, MD  JANUVIA 50 MG tablet Take 50 mg by mouth daily. 07/03/20  Yes [provider]  LORazepam (ATIVAN) 0.5 MG tablet Take 0.5 mg by mouth 2 (two) times daily as needed. 08/07/20  Yes [provider]  metFORMIN (GLUCOPHAGE) 1000 MG tablet Take 1 tablet (1,000 mg total) by mouth 2 (two) times daily. 11/18/17  Yes Emily Filbert, MD  pregabalin (LYRICA) 200 MG capsule Take 200 mg by mouth 3 (three) times daily. 08/07/20  Yes [provider]  SUBOXONE 8-2 MG FILM Place 2 mg under the tongue 3 (three) times daily. 01/18/16  Yes [provider]  amLODipine (NORVASC) 5 MG tablet Take 5 mg by mouth daily. Patient not taking: No sig reported 09/12/19   [provider]  Insulin Pen Needle (PEN NEEDLES) 31G X 5 MM MISC 20 Units by Does  not apply route at bedtime. 10/06/19   Arnetha Courser, MD  LYRICA 150 MG capsule Take 1 capsule (150 mg total) by mouth 2 (two) times daily. Patient not taking: No sig reported 11/18/17   Emily Filbert, MD    Allergies Patient has no known allergies.  History reviewed. No pertinent family history.  Social History Social History   Tobacco Use  . Smoking status: Current Every Day Smoker    Packs/day: 0.50    Types: Cigarettes  . Smokeless tobacco: Never Used  Vaping Use  . Vaping Use: Never used  Substance Use Topics  . Alcohol use: No  . Drug use: Yes    Types: Cocaine    Comment: suboxine    Review of Systems Level 5 caveat: History and review of systems are limited because the patient is a vague and unclear historian.   ____________________________________________   PHYSICAL EXAM:  VITAL SIGNS: ED Triage Vitals  Enc Vitals Group     BP 08/09/20 2231 (!) 140/92     Pulse Rate 08/09/20 2231 84     Resp 08/09/20 2231 20     Temp 08/09/20 2231 97.7 F (36.5 C)     Temp Source 08/09/20 2231 Oral     SpO2 08/09/20 2228 100 %     Weight 08/09/20 2229 113.4 kg (250 lb)     Height 08/09/20 2229 1.88 m (6\' 2" )     Head Circumference --      Peak Flow --      Pain Score 08/09/20 2229 8     Pain Loc --      Pain Edu? --      Excl. in GC? --     Constitutional: Alert and oriented.  No acute distress but upset emotionally and only intermittently want to palpate in the history and exam. Eyes: Conjunctivae are normal.  Head: Atraumatic. Nose: No congestion/rhinnorhea. Mouth/Throat: Patient is wearing a mask. Neck: No stridor.  No meningeal signs.   Cardiovascular: Normal rate, regular rhythm. Good peripheral circulation. Respiratory: Normal respiratory effort.  No retractions. Gastrointestinal: Soft and nontender. No distention.  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  There is no indication that this is  an acute issue.  He has some associated swelling but that also appears chronic with extensive skin changes but no signs of acute or purulent infection. Neurologic:  Normal speech and language. No gross focal neurologic deficits are appreciated.  Skin:  Skin is warm, dry and intact.   ____________________________________________   LABS (all labs ordered are listed, but only abnormal results are displayed)  Labs Reviewed  CBG MONITORING, ED - Abnormal; Notable for the following  components:      Result Value   Glucose-Capillary 227 (*)    All other components within normal limits  RESP PANEL BY RT-PCR (FLU A&B, COVID) [id number]   ____________________________________________  EKG  No indication for emergent EKG ____________________________________________  RADIOLOGY I, Loleta Roseory Kiante Petrovich, personally viewed and evaluated these images (plain radiographs) as part of my medical decision making, as well as reviewing the written report by the radiologist.  ED MD interpretation: No acute abnormalities identified, patient has chronic deformity but no evidence of osteomyelitis.  Official radiology report(s): DG Ankle 2 Views Left  Result Date: 08/10/2020 CLINICAL DATA:  Left ankle pain and swelling EXAM: LEFT ANKLE - 2 VIEW COMPARISON:  03/23/2017 FINDINGS: Two view radiograph left ankle demonstrates a remote, markedly angulated, healed fracture of the distal tibial and fibular diaphyses with exuberant callus formation and ankylosis with residual marked medial angulation, similar to that noted on prior examination. Tibial intramedullary rod is partially visualized. Remote nonunited fracture of the medial malleolus is again identified. There is widening of the ankle mortise and mild articular incongruity. This appears, however, improved since prior examination. Secondary mild degenerative arthritis of the tibiotalar joint. Soft tissue ulceration noted adjacent to the base of the fifth metatarsal. Extensive  subcutaneous edema noted within the visualized soft tissues of the left ankle. IMPRESSION: Soft tissue swelling. No acute fracture, dislocation, or destructive osseous lesion. Ulceration again noted adjacent to the base of the fifth metatarsal. Electronically Signed   By: Helyn NumbersAshesh  Parikh MD   On: 08/10/2020 05:42   DG Foot 2 Views Left  Result Date: 08/10/2020 CLINICAL DATA:  Left foot pain EXAM: LEFT FOOT - 2 VIEW COMPARISON:  None. FINDINGS: Two view radiograph left foot demonstrates a remote nonunited fracture of the medial malleolus and probable posttraumatic moderate to severe degenerative arthritis of the tibiotalar articulation. There is mild midfoot degenerative arthritis. There is a soft tissue defect involving the lateral aspect of the left midfoot superficial to the base of the fifth metatarsal. No osseous erosion or abnormal periosteal reaction. Joint spaces are otherwise preserved. No acute fracture or dislocation. IMPRESSION: Soft tissue defect involving the lateral left midfoot. No acute fracture, dislocation, or destructive osseous lesion. Electronically Signed   By: Helyn NumbersAshesh  Parikh MD   On: 08/10/2020 05:39    ____________________________________________   PROCEDURES   Procedure(s) performed (including Critical Care):  Procedures   ____________________________________________   INITIAL IMPRESSION / MDM / ASSESSMENT AND PLAN / ED COURSE  As part of my medical decision making, I reviewed the following data within the electronic MEDICAL RECORD NUMBER Nursing notes reviewed and incorporated, Labs reviewed , Old chart reviewed, Radiograph reviewed , Notes from prior ED visits and [place] Controlled Substance Database   Differential diagnosis includes, but is not limited to, homelessness, loss of medical resources, loss of financial resources, loss of medications, acute infection, acute orthopedic injury.  I believe that the patient needs help although it is somewhat difficult to determine  exactly what he requires.  It is also unclear how long he has been in the current situation.  He is vague and limited in his answers.  I reviewed the medical record and can see no recent notes to indicate what medications he takes.  No pharmacies are open currently.  Out of an abundance of caution I obtained radiographs of his left foot and ankle to make sure there is no evidence of an acute or emergent condition such as osteomyelitis or fracture/dislocation.  I personally reviewed the  patient's imaging and agree with the radiologist's interpretation that there is nothing acute and that his issues are chronic.  There is no evidence of an acute ulcer but there is chronic thickening on the lateral aspect of the foot.  The patient has been getting by medically as an outpatient for some time.  I do not believe that there is an inpatient issue which is currently bothering him more for which he needs extensive lab work performed.  He asked me if I had "check my blood" but he could not explain what he was worried about given that he has generally reassuring review of systems.  I checked a fingerstick blood sugar which was about 220 which is generally reassuring and I do not believe he would benefit from additional blood work.  I ordered a Covid swab to facilitate placement both in the ED and afterwards.  Given the uncertainties of his situation, I placed him in ED boarding status with a request that the pharmacy complete the med reconciliation form as fast as possible and that social work consult on him to check about medication assistance and any other outpatient resources that might be available.       Clinical Course as of 08/10/20 0740  Sat Aug 10, 2020  3875 Glucose-Capillary(!): 227 Generally reassuring CBG [CF]    Clinical Course User Index [CF] Loleta Rose, MD     ____________________________________________  FINAL CLINICAL IMPRESSION(S) / ED DIAGNOSES  Final diagnoses:  Homelessness   Encounter for medication refill  Other chronic pain  Deformity of left foot     MEDICATIONS GIVEN DURING THIS VISIT:  Medications  pregabalin (LYRICA) capsule 150 mg (150 mg Oral Given 08/10/20 0522)  acetaminophen (TYLENOL) tablet 1,000 mg (1,000 mg Oral Given 08/10/20 6433)     ED Discharge Orders    None      *Please note:  Joshua Gordon was evaluated in Emergency Department on 08/10/2020 for the symptoms described in the history of present illness. He was evaluated in the context of the global COVID-19 pandemic, which necessitated consideration that the patient might be at risk for infection with the SARS-CoV-2 virus that causes COVID-19. Institutional protocols and algorithms that pertain to the evaluation of patients at risk for COVID-19 are in a state of rapid change based on information released by regulatory bodies including the CDC and federal and state organizations. These policies and algorithms were followed during the patient's care in the ED.  Some ED evaluations and interventions may be delayed as a result of limited staffing during and after the pandemic.*  Note:  This document was prepared using Dragon voice recognition software and may include unintentional dictation errors.   Loleta Rose, MD 08/10/20 [phone]

## 2020-08-10 NOTE — ED Notes (Signed)
Pt resting in lobby in no acute distress.

## 2020-08-10 NOTE — Discharge Instructions (Addendum)
Resources were provided by the social work.  I prescribed you your medications but you need to follow-up with a doctor for long-term management

## 2020-08-10 NOTE — ED Notes (Signed)
Patient talking with daughter, no where to go as of right now, patient attempting to get in contact with friend that he has been staying with.

## 2020-08-10 NOTE — ED Notes (Signed)
Patient continues to rest in wheelchair, patient rr even and non-labored

## 2020-08-10 NOTE — ED Notes (Signed)
3 attempts made at blood collection without success.

## 2020-08-10 NOTE — ED Notes (Signed)
Patient remains resting in wheelchair, nadn.

## 2020-08-10 NOTE — ED Notes (Signed)
Spoke with pt's daughter Aggie Cosier who states she lives over 30 miles away and is working on finding him a place to stay.  States if we can keep pt for a few hours she will find him a safe place and will call us back when she has secured him a place to go.

## 2020-08-10 NOTE — ED Provider Notes (Signed)
Resources were provided by the patient from social work.  Patient has Medicaid and Medicare so should be able to get prescriptions.  Prescriptions were ordered for patient although I did not order his prescription for Suboxone, lorazepam or pregabalin and given he was recently filled on 2/18.  Daughter is working on finding pt a place to go too. Therefore will put pt up for discharge.    Concha Se, MD 08/10/20 [phone]

## 2020-08-10 NOTE — TOC Progression Note (Signed)
Transition of Care [facility]) - Progression Note    Patient Details  Name: Joshua Gordon MRN: [id number] Date of Birth: 02-22-1956  Transition of Care [facility]) CM/SW Contact  Larwance Rote, LCSW Phone Number: 08/10/2020, 10:43 AM  Clinical Narrative:   Patient provided with [place] resource list: Primary care provider list such as the open door; homeless shelters; and soup kitchen resources.    Expected Discharge Plan and Services     Social Determinants of Health (SDOH) Interventions   Readmission Risk Interventions No flowsheet data found.

## 2020-08-10 NOTE — ED Notes (Signed)
Patient provided with snack/drink and hygiene kit prior to DC. Patient provided with blankets. Patient still trying to call for a ride. Daughter is aware and also trying to make arrangements for him tonight. Patient A&OX3.

## 2020-08-10 NOTE — ED Notes (Signed)
PT has not received breakfast tray at this time, dietary called, will send tray, pt provided graham crackers and peanut butter and orange juice.

## 2020-08-15 DIAGNOSIS — R519 Headache, unspecified: Secondary | ICD-10-CM | POA: Diagnosis not present

## 2020-08-15 DIAGNOSIS — I10 Essential (primary) hypertension: Secondary | ICD-10-CM | POA: Diagnosis not present

## 2020-08-22 DIAGNOSIS — Z79899 Other long term (current) drug therapy: Secondary | ICD-10-CM | POA: Diagnosis not present

## 2020-08-22 DIAGNOSIS — E1140 Type 2 diabetes mellitus with diabetic neuropathy, unspecified: Secondary | ICD-10-CM | POA: Diagnosis not present

## 2020-08-30 DIAGNOSIS — R600 Localized edema: Secondary | ICD-10-CM | POA: Diagnosis not present

## 2020-08-30 DIAGNOSIS — Z79899 Other long term (current) drug therapy: Secondary | ICD-10-CM | POA: Diagnosis not present

## 2020-08-30 DIAGNOSIS — M79604 Pain in right leg: Secondary | ICD-10-CM | POA: Diagnosis not present

## 2020-08-30 DIAGNOSIS — I10 Essential (primary) hypertension: Secondary | ICD-10-CM | POA: Diagnosis not present

## 2020-09-06 DIAGNOSIS — Z79899 Other long term (current) drug therapy: Secondary | ICD-10-CM | POA: Diagnosis not present

## 2020-09-06 DIAGNOSIS — R519 Headache, unspecified: Secondary | ICD-10-CM | POA: Diagnosis not present

## 2020-09-13 DIAGNOSIS — I10 Essential (primary) hypertension: Secondary | ICD-10-CM | POA: Diagnosis not present

## 2020-09-23 DIAGNOSIS — I10 Essential (primary) hypertension: Secondary | ICD-10-CM | POA: Diagnosis not present

## 2020-09-23 DIAGNOSIS — J310 Chronic rhinitis: Secondary | ICD-10-CM | POA: Diagnosis not present

## 2020-09-23 DIAGNOSIS — F17210 Nicotine dependence, cigarettes, uncomplicated: Secondary | ICD-10-CM | POA: Diagnosis not present

## 2020-09-26 DIAGNOSIS — E1140 Type 2 diabetes mellitus with diabetic neuropathy, unspecified: Secondary | ICD-10-CM | POA: Diagnosis not present

## 2020-09-26 DIAGNOSIS — F17210 Nicotine dependence, cigarettes, uncomplicated: Secondary | ICD-10-CM | POA: Diagnosis not present

## 2020-09-26 DIAGNOSIS — I10 Essential (primary) hypertension: Secondary | ICD-10-CM | POA: Diagnosis not present

## 2020-10-17 DIAGNOSIS — Z79899 Other long term (current) drug therapy: Secondary | ICD-10-CM | POA: Diagnosis not present

## 2020-10-24 DIAGNOSIS — Z79899 Other long term (current) drug therapy: Secondary | ICD-10-CM | POA: Diagnosis not present

## 2020-10-24 DIAGNOSIS — I10 Essential (primary) hypertension: Secondary | ICD-10-CM | POA: Diagnosis not present

## 2020-10-28 DIAGNOSIS — I10 Essential (primary) hypertension: Secondary | ICD-10-CM | POA: Diagnosis not present

## 2020-10-28 DIAGNOSIS — E1140 Type 2 diabetes mellitus with diabetic neuropathy, unspecified: Secondary | ICD-10-CM | POA: Diagnosis not present

## 2020-10-28 DIAGNOSIS — K219 Gastro-esophageal reflux disease without esophagitis: Secondary | ICD-10-CM | POA: Diagnosis not present

## 2020-10-31 DIAGNOSIS — E1140 Type 2 diabetes mellitus with diabetic neuropathy, unspecified: Secondary | ICD-10-CM | POA: Diagnosis not present

## 2020-10-31 DIAGNOSIS — I10 Essential (primary) hypertension: Secondary | ICD-10-CM | POA: Diagnosis not present

## 2020-10-31 DIAGNOSIS — Z79899 Other long term (current) drug therapy: Secondary | ICD-10-CM | POA: Diagnosis not present

## 2020-11-07 DIAGNOSIS — Z79899 Other long term (current) drug therapy: Secondary | ICD-10-CM | POA: Diagnosis not present

## 2020-11-07 DIAGNOSIS — E1140 Type 2 diabetes mellitus with diabetic neuropathy, unspecified: Secondary | ICD-10-CM | POA: Diagnosis not present

## 2020-11-07 DIAGNOSIS — I10 Essential (primary) hypertension: Secondary | ICD-10-CM | POA: Diagnosis not present

## 2020-11-14 DIAGNOSIS — F17210 Nicotine dependence, cigarettes, uncomplicated: Secondary | ICD-10-CM | POA: Diagnosis not present

## 2020-11-14 DIAGNOSIS — Z79899 Other long term (current) drug therapy: Secondary | ICD-10-CM | POA: Diagnosis not present

## 2020-11-14 DIAGNOSIS — R600 Localized edema: Secondary | ICD-10-CM | POA: Diagnosis not present

## 2020-11-14 DIAGNOSIS — R52 Pain, unspecified: Secondary | ICD-10-CM | POA: Diagnosis not present

## 2020-11-18 DIAGNOSIS — E1140 Type 2 diabetes mellitus with diabetic neuropathy, unspecified: Secondary | ICD-10-CM | POA: Diagnosis not present

## 2020-11-18 DIAGNOSIS — M79604 Pain in right leg: Secondary | ICD-10-CM | POA: Diagnosis not present

## 2020-11-18 DIAGNOSIS — I10 Essential (primary) hypertension: Secondary | ICD-10-CM | POA: Diagnosis not present

## 2020-11-18 DIAGNOSIS — M79606 Pain in leg, unspecified: Secondary | ICD-10-CM | POA: Diagnosis not present

## 2020-11-21 DIAGNOSIS — R600 Localized edema: Secondary | ICD-10-CM | POA: Diagnosis not present

## 2020-11-21 DIAGNOSIS — Z79899 Other long term (current) drug therapy: Secondary | ICD-10-CM | POA: Diagnosis not present

## 2020-11-21 DIAGNOSIS — F17210 Nicotine dependence, cigarettes, uncomplicated: Secondary | ICD-10-CM | POA: Diagnosis not present

## 2020-11-28 DIAGNOSIS — I10 Essential (primary) hypertension: Secondary | ICD-10-CM | POA: Diagnosis not present

## 2020-12-06 DIAGNOSIS — I10 Essential (primary) hypertension: Secondary | ICD-10-CM | POA: Diagnosis not present

## 2020-12-06 DIAGNOSIS — R52 Pain, unspecified: Secondary | ICD-10-CM | POA: Diagnosis not present

## 2020-12-06 DIAGNOSIS — Z79899 Other long term (current) drug therapy: Secondary | ICD-10-CM | POA: Diagnosis not present

## 2020-12-11 DIAGNOSIS — M25512 Pain in left shoulder: Secondary | ICD-10-CM | POA: Diagnosis not present

## 2020-12-11 DIAGNOSIS — E1140 Type 2 diabetes mellitus with diabetic neuropathy, unspecified: Secondary | ICD-10-CM | POA: Diagnosis not present

## 2020-12-11 DIAGNOSIS — R600 Localized edema: Secondary | ICD-10-CM | POA: Diagnosis not present

## 2020-12-11 DIAGNOSIS — M25511 Pain in right shoulder: Secondary | ICD-10-CM | POA: Diagnosis not present

## 2020-12-11 DIAGNOSIS — Z9181 History of falling: Secondary | ICD-10-CM | POA: Diagnosis not present

## 2020-12-13 DIAGNOSIS — Z79899 Other long term (current) drug therapy: Secondary | ICD-10-CM | POA: Diagnosis not present

## 2020-12-13 DIAGNOSIS — F17210 Nicotine dependence, cigarettes, uncomplicated: Secondary | ICD-10-CM | POA: Diagnosis not present

## 2020-12-19 DIAGNOSIS — Z79899 Other long term (current) drug therapy: Secondary | ICD-10-CM | POA: Diagnosis not present

## 2020-12-19 DIAGNOSIS — E1140 Type 2 diabetes mellitus with diabetic neuropathy, unspecified: Secondary | ICD-10-CM | POA: Diagnosis not present

## 2020-12-19 DIAGNOSIS — I10 Essential (primary) hypertension: Secondary | ICD-10-CM | POA: Diagnosis not present

## 2020-12-27 DIAGNOSIS — Z79899 Other long term (current) drug therapy: Secondary | ICD-10-CM | POA: Diagnosis not present

## 2021-01-03 DIAGNOSIS — L03019 Cellulitis of unspecified finger: Secondary | ICD-10-CM | POA: Diagnosis not present

## 2021-01-03 DIAGNOSIS — Z79899 Other long term (current) drug therapy: Secondary | ICD-10-CM | POA: Diagnosis not present

## 2021-01-06 ENCOUNTER — Other Ambulatory Visit: Payer: Self-pay

## 2021-01-06 ENCOUNTER — Emergency Department
Admission: EM | Admit: 2021-01-06 | Discharge: 2021-01-07 | Disposition: A | Discharge status: Home / Self Care | Payer: Medicare Other | Attending: Emergency Medicine | Admitting: Emergency Medicine

## 2021-01-06 ENCOUNTER — Emergency Department: Payer: Medicare Other

## 2021-01-06 ENCOUNTER — Encounter: Payer: Self-pay | Admitting: Emergency Medicine

## 2021-01-06 DIAGNOSIS — R4789 Other speech disturbances: Secondary | ICD-10-CM | POA: Insufficient documentation

## 2021-01-06 DIAGNOSIS — F17210 Nicotine dependence, cigarettes, uncomplicated: Secondary | ICD-10-CM | POA: Insufficient documentation

## 2021-01-06 DIAGNOSIS — Z7984 Long term (current) use of oral hypoglycemic drugs: Secondary | ICD-10-CM | POA: Diagnosis not present

## 2021-01-06 DIAGNOSIS — Z79899 Other long term (current) drug therapy: Secondary | ICD-10-CM | POA: Diagnosis not present

## 2021-01-06 DIAGNOSIS — E119 Type 2 diabetes mellitus without complications: Secondary | ICD-10-CM | POA: Diagnosis not present

## 2021-01-06 DIAGNOSIS — W19XXXA Unspecified fall, initial encounter: Secondary | ICD-10-CM | POA: Insufficient documentation

## 2021-01-06 DIAGNOSIS — R531 Weakness: Secondary | ICD-10-CM | POA: Diagnosis not present

## 2021-01-06 DIAGNOSIS — Z743 Need for continuous supervision: Secondary | ICD-10-CM | POA: Diagnosis not present

## 2021-01-06 DIAGNOSIS — Y92009 Unspecified place in unspecified non-institutional (private) residence as the place of occurrence of the external cause: Secondary | ICD-10-CM | POA: Diagnosis not present

## 2021-01-06 DIAGNOSIS — R0902 Hypoxemia: Secondary | ICD-10-CM | POA: Diagnosis not present

## 2021-01-06 DIAGNOSIS — I10 Essential (primary) hypertension: Secondary | ICD-10-CM | POA: Insufficient documentation

## 2021-01-06 DIAGNOSIS — Z96651 Presence of right artificial knee joint: Secondary | ICD-10-CM | POA: Insufficient documentation

## 2021-01-06 DIAGNOSIS — R739 Hyperglycemia, unspecified: Secondary | ICD-10-CM | POA: Diagnosis not present

## 2021-01-06 DIAGNOSIS — S022XXA Fracture of nasal bones, initial encounter for closed fracture: Secondary | ICD-10-CM | POA: Diagnosis not present

## 2021-01-06 DIAGNOSIS — R600 Localized edema: Secondary | ICD-10-CM | POA: Diagnosis not present

## 2021-01-06 DIAGNOSIS — Z794 Long term (current) use of insulin: Secondary | ICD-10-CM | POA: Insufficient documentation

## 2021-01-06 LAB — URINALYSIS, COMPLETE (UACMP) WITH MICROSCOPIC
Bilirubin Urine: NEGATIVE
Glucose, UA: 50 mg/dL — AB
Hgb urine dipstick: NEGATIVE
Ketones, ur: NEGATIVE mg/dL
Leukocytes,Ua: NEGATIVE
Nitrite: NEGATIVE
Protein, ur: NEGATIVE mg/dL
Specific Gravity, Urine: 1.01 (ref 1.005–1.030)
Squamous Epithelial / HPF: NONE SEEN (ref 0–5)
pH: 8 (ref 5.0–8.0)

## 2021-01-06 LAB — CBC
HCT: 42.8 % (ref 39.0–52.0)
Hemoglobin: 14.2 g/dL (ref 13.0–17.0)
MCH: 27.6 pg (ref 26.0–34.0)
MCHC: 33.2 g/dL (ref 30.0–36.0)
MCV: 83.1 fL (ref 80.0–100.0)
Platelets: 139 10*3/uL — ABNORMAL LOW (ref 150–400)
RBC: 5.15 MIL/uL (ref 4.22–5.81)
RDW: 12.8 % (ref 11.5–15.5)
WBC: 5.1 10*3/uL (ref 4.0–10.5)
nRBC: 0 % (ref 0.0–0.2)

## 2021-01-06 LAB — BASIC METABOLIC PANEL
Anion gap: 7 (ref 5–15)
BUN: 11 mg/dL (ref 8–23)
CO2: 26 mmol/L (ref 22–32)
Calcium: 9 mg/dL (ref 8.9–10.3)
Chloride: 98 mmol/L (ref 98–111)
Creatinine, Ser: 0.85 mg/dL (ref 0.61–1.24)
GFR, Estimated: >60 mL/min (ref >60)
Glucose, Bld: 238 mg/dL — ABNORMAL HIGH (ref 70–99)
Potassium: 4 mmol/L (ref 3.5–5.1)
Sodium: 131 mmol/L — ABNORMAL LOW (ref 135–145)

## 2021-01-06 NOTE — ED Provider Notes (Signed)
  Physical Exam  BP 139/88 (BP Location: Right Arm)   Pulse 65   Temp 97.6 F (36.4 C) (Oral)   Resp 16   Ht 6\' 3"  (1.905 m)   Wt 113.4 kg   SpO2 100%   BMI 31.25 kg/m   Physical Exam  ED Course/Procedures   Clinical Course as of 01/06/21 2336  Mon Jan 06, 2021  2008 WBC: 5.1 [KP]    Clinical Course User Index [KP] 2009, MD    Procedures  MDM  Assumed care from Dr. Minna Antis.  Urinalysis only shows rare bacteria.  Patient is appropriate for discharge.       Lenard Lance [place], [place] 01/06/21 2337    2338, MD 01/07/21 1009

## 2021-01-06 NOTE — ED Triage Notes (Signed)
Pt via EMS from home. Pt had a unwitnessed fall, he lives with a roommate. EMS reports this is at his baseline. Pt is wheelchair bound. Pt is A&Ox4 and NAD. Denies pain.

## 2021-01-06 NOTE — ED Notes (Signed)
Pt placed on male purewick at this time for urine sample

## 2021-01-06 NOTE — ED Notes (Signed)
Called EMS for transport home 

## 2021-01-06 NOTE — ED Notes (Signed)
Patient straight cath for urine sample.

## 2021-01-06 NOTE — ED Triage Notes (Addendum)
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 Reports this is his baseline. No complaints. Baseline is wheelchair bound. CBG 298

## 2021-01-06 NOTE — ED Notes (Signed)
Patient to ED via EMS per triage note. Pt unsure of why hes at ED but denies pain at this time

## 2021-01-06 NOTE — ED Provider Notes (Signed)
[facility] Emergency Department Provider Note  Time seen: 8:05 PM  I have reviewed the triage vital signs and the nursing notes.   HISTORY  Chief Complaint Fall    HPI Joshua Gordon is a 65 y.o. male with a past medical history of chronic pain, diabetes, hypertension, generalized weakness, presents emergency department after a fall.  According to the patient and report patient is wheelchair-bound, had a fall today however patient does not recall the fall.  Brought in by EMS for further evaluation.  Here the patient has no complaints, he is somewhat sluggish to respond however per EMS report this is the patient's baseline.  He does respond accurately.  His only complaint is saying he is hungry and asking for food.   Past Medical History:  Diagnosis Date   Chronic pain    Diabetes mellitus without complication (HCC)    Hypertension    Non compliance with medical treatment    Pain management    contract for pain management   Polysubstance abuse [facility])    Renal disorder     Patient Active Problem List   Diagnosis Date Noted   Generalized weakness    Rhabdomyolysis 10/03/2019   Cellulitis 10/03/2019   Hypertension    Diabetes mellitus without complication (HCC)    Chronic pain     Past Surgical History:  Procedure Laterality Date   FEMUR FRACTURE SURGERY Left    KNEE SURGERY Left 1991   5 times   REPLACEMENT TOTAL KNEE Right    twice    Prior to Admission medications   Medication Sig Start Date End Date Taking? Authorizing Provider  ALPRAZolam Prudy Feeler) 1 MG tablet Take 1 tablet (1 mg total) by mouth 2 (two) times daily. 10/06/19   Arnetha Courser, MD  amLODipine (NORVASC) 5 MG tablet Take 5 mg by mouth daily. Patient not taking: No sig reported 09/12/19   [provider]  cloNIDine (CATAPRES) 0.2 MG tablet Take 1 tablet (0.2 mg total) by mouth 3 (three) times daily for 14 days. 08/10/20 08/24/20  Concha Se, MD  esomeprazole (NEXIUM)  40 MG capsule Take 1 capsule (40 mg total) by mouth daily for 14 days. 08/10/20 08/24/20  Concha Se, MD  insulin glargine (LANTUS SOLOSTAR) 100 UNIT/ML Solostar Pen Inject 20 Units into the skin daily at 10 pm for 15 days. 08/10/20 08/25/20  Concha Se, MD  Insulin Pen Needle (PEN NEEDLES) 31G X 5 MM MISC 20 Units by Does not apply route at bedtime. 10/06/19   Arnetha Courser, MD  JANUVIA 50 MG tablet Take 1 tablet (50 mg total) by mouth daily for 14 days. 08/10/20 08/24/20  Concha Se, MD  LORazepam (ATIVAN) 0.5 MG tablet Take 0.5 mg by mouth 2 (two) times daily as needed. 08/07/20   [provider]  LYRICA 150 MG capsule Take 1 capsule (150 mg total) by mouth 2 (two) times daily. Patient not taking: No sig reported 11/18/17   Emily Filbert, MD  metFORMIN (GLUCOPHAGE) 1000 MG tablet Take 1 tablet (1,000 mg total) by mouth 2 (two) times daily for 14 days. 08/10/20 08/24/20  Concha Se, MD  pregabalin (LYRICA) 200 MG capsule Take 200 mg by mouth 3 (three) times daily. 08/07/20   [provider]  SUBOXONE 8-2 MG FILM Place 2 mg under the tongue 3 (three) times daily. 01/18/16   [provider]    No Known Allergies  History reviewed. No pertinent family history.  Social History Social History   Tobacco Use   Smoking status: Every Day    Packs/day: 0.50    Types: Cigarettes   Smokeless tobacco: Never  Vaping Use   Vaping Use: Never used  Substance Use Topics   Alcohol use: No   Drug use: Yes    Types: Cocaine    Comment: suboxine    Review of Systems Constitutional: Negative for fever. Cardiovascular: Negative for chest pain. Respiratory: Negative for shortness of breath. Gastrointestinal: Negative for abdominal pain Genitourinary: Negative for urinary compaints Musculoskeletal: Negative for musculoskeletal complaints Neurological: Negative for headache All other ROS negative  ____________________________________________   PHYSICAL  EXAM:  VITAL SIGNS: ED Triage Vitals [01/06/21 1620]  Enc Vitals Group     BP (!) 144/90     Pulse Rate (!) 59     Resp 20     Temp 97.6 F (36.4 C)     Temp Source Oral     SpO2 99 %     Weight 250 lb (113.4 kg)     Height 6\' 3"  (1.905 m)     Head Circumference      Peak Flow      Pain Score 0     Pain Loc      Pain Edu?      Excl. in GC?    Constitutional: Patient is awake and alert, slow responses but accurate responses.  Eyes: Normal exam ENT      Head: Normocephalic and atraumatic.      Mouth/Throat: Mucous membranes are moist. Cardiovascular: Normal rate, regular rhythm. No murmurs, rubs, or gallops. Respiratory: Normal respiratory effort without tachypnea nor retractions. Breath sounds are clear  Gastrointestinal: Soft and nontender. No distention. Musculoskeletal: 1+ lower extreme edema bilaterally. Neurologic: Somewhat slowed speech and responsiveness.  Moves upper extremities. Skin:  Skin is warm.  Small chronic appearing ulcerations to bilateral lower extremities Psychiatric: Mood and affect are normal  ____________________________________________    EKG  EKG viewed and interpreted by myself shows normal sinus rhythm at 63 bpm with a narrow QRS, normal axis, normal intervals with no concerning ST changes.  ____________________________________________    RADIOLOGY  CT negative  ____________________________________________   INITIAL IMPRESSION / ASSESSMENT AND PLAN / ED COURSE  Pertinent labs & imaging results that were available during my care of the patient were reviewed by me and considered in my medical decision making (see chart for details).   Patient presents emergency department after a fall at home.  Patient is wheelchair-bound at baseline.  Here the patient appears well he has no complaints does have slow responses but per EMS report this is the patient's baseline.  Patient is asking for food to eat.  We will feed the patient, will check labs,  urinalysis and obtain a CT scan as a precaution.  CT scan head is negative.  Lab work largely within normal limits.  Patient eating and overall appears well.  Urine pending anticipate likely discharge home.  Joshua Gordon was evaluated in Emergency Department on 01/06/2021 for the symptoms described in the history of present illness. He was evaluated in the context of the global COVID-19 pandemic, which necessitated consideration that the patient might be at risk for infection with the SARS-CoV-2 virus that causes COVID-19. Institutional protocols and algorithms that pertain to the evaluation of patients at risk for COVID-19 are in a state of rapid change based on information released by regulatory bodies including the CDC and federal and state organizations. These  policies and algorithms were followed during the patient's care in the ED.  ____________________________________________   FINAL CLINICAL IMPRESSION(S) / ED DIAGNOSES  Thresa Ross, MD 01/06/21 2204

## 2021-01-07 DIAGNOSIS — R0902 Hypoxemia: Secondary | ICD-10-CM | POA: Diagnosis not present

## 2021-01-07 DIAGNOSIS — G8220 Paraplegia, unspecified: Secondary | ICD-10-CM | POA: Diagnosis not present

## 2021-01-07 DIAGNOSIS — R6889 Other general symptoms and signs: Secondary | ICD-10-CM | POA: Diagnosis not present

## 2021-01-07 DIAGNOSIS — Z743 Need for continuous supervision: Secondary | ICD-10-CM | POA: Diagnosis not present

## 2021-01-07 DIAGNOSIS — Z7401 Bed confinement status: Secondary | ICD-10-CM | POA: Diagnosis not present

## 2021-01-14 DIAGNOSIS — R600 Localized edema: Secondary | ICD-10-CM | POA: Diagnosis not present

## 2021-01-14 DIAGNOSIS — Z79899 Other long term (current) drug therapy: Secondary | ICD-10-CM | POA: Diagnosis not present

## 2021-01-14 DIAGNOSIS — I10 Essential (primary) hypertension: Secondary | ICD-10-CM | POA: Diagnosis not present

## 2021-01-21 DIAGNOSIS — I10 Essential (primary) hypertension: Secondary | ICD-10-CM | POA: Diagnosis not present

## 2021-01-21 DIAGNOSIS — Z79899 Other long term (current) drug therapy: Secondary | ICD-10-CM | POA: Diagnosis not present

## 2021-01-28 DIAGNOSIS — Z79899 Other long term (current) drug therapy: Secondary | ICD-10-CM | POA: Diagnosis not present

## 2021-01-28 DIAGNOSIS — I10 Essential (primary) hypertension: Secondary | ICD-10-CM | POA: Diagnosis not present

## 2021-01-28 DIAGNOSIS — E1140 Type 2 diabetes mellitus with diabetic neuropathy, unspecified: Secondary | ICD-10-CM | POA: Diagnosis not present

## 2021-02-04 DIAGNOSIS — R52 Pain, unspecified: Secondary | ICD-10-CM | POA: Diagnosis not present

## 2021-02-04 DIAGNOSIS — Z79899 Other long term (current) drug therapy: Secondary | ICD-10-CM | POA: Diagnosis not present

## 2021-02-11 DIAGNOSIS — F17210 Nicotine dependence, cigarettes, uncomplicated: Secondary | ICD-10-CM | POA: Diagnosis not present

## 2021-02-11 DIAGNOSIS — Z79899 Other long term (current) drug therapy: Secondary | ICD-10-CM | POA: Diagnosis not present

## 2021-02-18 DIAGNOSIS — Z9181 History of falling: Secondary | ICD-10-CM | POA: Diagnosis not present

## 2021-02-18 DIAGNOSIS — Z79899 Other long term (current) drug therapy: Secondary | ICD-10-CM | POA: Diagnosis not present

## 2021-02-18 DIAGNOSIS — R600 Localized edema: Secondary | ICD-10-CM | POA: Diagnosis not present

## 2021-02-18 DIAGNOSIS — M25511 Pain in right shoulder: Secondary | ICD-10-CM | POA: Diagnosis not present

## 2021-02-18 DIAGNOSIS — I10 Essential (primary) hypertension: Secondary | ICD-10-CM | POA: Diagnosis not present

## 2021-02-18 DIAGNOSIS — F17210 Nicotine dependence, cigarettes, uncomplicated: Secondary | ICD-10-CM | POA: Diagnosis not present

## 2021-02-18 DIAGNOSIS — M25512 Pain in left shoulder: Secondary | ICD-10-CM | POA: Diagnosis not present

## 2021-02-18 DIAGNOSIS — E1140 Type 2 diabetes mellitus with diabetic neuropathy, unspecified: Secondary | ICD-10-CM | POA: Diagnosis not present

## 2021-02-25 DIAGNOSIS — F17210 Nicotine dependence, cigarettes, uncomplicated: Secondary | ICD-10-CM | POA: Diagnosis not present

## 2021-02-25 DIAGNOSIS — Z79899 Other long term (current) drug therapy: Secondary | ICD-10-CM | POA: Diagnosis not present

## 2021-02-27 DIAGNOSIS — Z9181 History of falling: Secondary | ICD-10-CM | POA: Diagnosis not present

## 2021-02-27 DIAGNOSIS — M25511 Pain in right shoulder: Secondary | ICD-10-CM | POA: Diagnosis not present

## 2021-02-27 DIAGNOSIS — R600 Localized edema: Secondary | ICD-10-CM | POA: Diagnosis not present

## 2021-02-27 DIAGNOSIS — M25512 Pain in left shoulder: Secondary | ICD-10-CM | POA: Diagnosis not present

## 2021-02-27 DIAGNOSIS — E1140 Type 2 diabetes mellitus with diabetic neuropathy, unspecified: Secondary | ICD-10-CM | POA: Diagnosis not present

## 2021-03-04 DIAGNOSIS — K219 Gastro-esophageal reflux disease without esophagitis: Secondary | ICD-10-CM | POA: Diagnosis not present

## 2021-03-04 DIAGNOSIS — E1140 Type 2 diabetes mellitus with diabetic neuropathy, unspecified: Secondary | ICD-10-CM | POA: Diagnosis not present

## 2021-03-04 DIAGNOSIS — R600 Localized edema: Secondary | ICD-10-CM | POA: Diagnosis not present

## 2021-03-04 DIAGNOSIS — I10 Essential (primary) hypertension: Secondary | ICD-10-CM | POA: Diagnosis not present

## 2021-03-11 DIAGNOSIS — Z79899 Other long term (current) drug therapy: Secondary | ICD-10-CM | POA: Diagnosis not present

## 2021-03-11 DIAGNOSIS — F17210 Nicotine dependence, cigarettes, uncomplicated: Secondary | ICD-10-CM | POA: Diagnosis not present

## 2021-03-13 DIAGNOSIS — Z79899 Other long term (current) drug therapy: Secondary | ICD-10-CM | POA: Diagnosis not present

## 2021-03-18 DIAGNOSIS — F17210 Nicotine dependence, cigarettes, uncomplicated: Secondary | ICD-10-CM | POA: Diagnosis not present

## 2021-03-18 DIAGNOSIS — M79604 Pain in right leg: Secondary | ICD-10-CM | POA: Diagnosis not present

## 2021-03-25 DIAGNOSIS — Z79899 Other long term (current) drug therapy: Secondary | ICD-10-CM | POA: Diagnosis not present

## 2021-03-25 DIAGNOSIS — E1140 Type 2 diabetes mellitus with diabetic neuropathy, unspecified: Secondary | ICD-10-CM | POA: Diagnosis not present

## 2021-04-01 DIAGNOSIS — Z79899 Other long term (current) drug therapy: Secondary | ICD-10-CM | POA: Diagnosis not present

## 2021-04-01 DIAGNOSIS — E1140 Type 2 diabetes mellitus with diabetic neuropathy, unspecified: Secondary | ICD-10-CM | POA: Diagnosis not present

## 2021-04-01 DIAGNOSIS — I10 Essential (primary) hypertension: Secondary | ICD-10-CM | POA: Diagnosis not present

## 2021-04-08 DIAGNOSIS — K219 Gastro-esophageal reflux disease without esophagitis: Secondary | ICD-10-CM | POA: Diagnosis not present

## 2021-04-08 DIAGNOSIS — Z79899 Other long term (current) drug therapy: Secondary | ICD-10-CM | POA: Diagnosis not present

## 2021-04-15 DIAGNOSIS — Z79899 Other long term (current) drug therapy: Secondary | ICD-10-CM | POA: Diagnosis not present

## 2021-04-15 DIAGNOSIS — M79604 Pain in right leg: Secondary | ICD-10-CM | POA: Diagnosis not present

## 2021-04-22 DIAGNOSIS — F17210 Nicotine dependence, cigarettes, uncomplicated: Secondary | ICD-10-CM | POA: Diagnosis not present

## 2021-04-22 DIAGNOSIS — Z79899 Other long term (current) drug therapy: Secondary | ICD-10-CM | POA: Diagnosis not present

## 2021-04-22 DIAGNOSIS — E1140 Type 2 diabetes mellitus with diabetic neuropathy, unspecified: Secondary | ICD-10-CM | POA: Diagnosis not present

## 2021-04-29 DIAGNOSIS — Z79899 Other long term (current) drug therapy: Secondary | ICD-10-CM | POA: Diagnosis not present

## 2021-04-29 DIAGNOSIS — F17210 Nicotine dependence, cigarettes, uncomplicated: Secondary | ICD-10-CM | POA: Diagnosis not present

## 2021-05-06 DIAGNOSIS — E1140 Type 2 diabetes mellitus with diabetic neuropathy, unspecified: Secondary | ICD-10-CM | POA: Diagnosis not present

## 2021-05-06 DIAGNOSIS — I10 Essential (primary) hypertension: Secondary | ICD-10-CM | POA: Diagnosis not present

## 2021-05-20 DIAGNOSIS — F17210 Nicotine dependence, cigarettes, uncomplicated: Secondary | ICD-10-CM | POA: Diagnosis not present

## 2021-05-20 DIAGNOSIS — Z79899 Other long term (current) drug therapy: Secondary | ICD-10-CM | POA: Diagnosis not present

## 2021-05-25 ENCOUNTER — Emergency Department: Payer: Medicare Other

## 2021-05-25 ENCOUNTER — Emergency Department
Admission: EM | Admit: 2021-05-25 | Discharge: 2021-05-25 | Disposition: A | Discharge status: Home / Self Care | Payer: Medicare Other | Attending: Emergency Medicine | Admitting: Emergency Medicine

## 2021-05-25 ENCOUNTER — Other Ambulatory Visit: Payer: Self-pay

## 2021-05-25 DIAGNOSIS — Z96651 Presence of right artificial knee joint: Secondary | ICD-10-CM | POA: Diagnosis not present

## 2021-05-25 DIAGNOSIS — S99922A Unspecified injury of left foot, initial encounter: Secondary | ICD-10-CM | POA: Diagnosis present

## 2021-05-25 DIAGNOSIS — Z23 Encounter for immunization: Secondary | ICD-10-CM | POA: Diagnosis not present

## 2021-05-25 DIAGNOSIS — S90852A Superficial foreign body, left foot, initial encounter: Secondary | ICD-10-CM | POA: Diagnosis not present

## 2021-05-25 DIAGNOSIS — Z743 Need for continuous supervision: Secondary | ICD-10-CM | POA: Diagnosis not present

## 2021-05-25 DIAGNOSIS — Z794 Long term (current) use of insulin: Secondary | ICD-10-CM | POA: Insufficient documentation

## 2021-05-25 DIAGNOSIS — S91322A Laceration with foreign body, left foot, initial encounter: Secondary | ICD-10-CM | POA: Insufficient documentation

## 2021-05-25 DIAGNOSIS — Z79899 Other long term (current) drug therapy: Secondary | ICD-10-CM | POA: Diagnosis not present

## 2021-05-25 DIAGNOSIS — M79673 Pain in unspecified foot: Secondary | ICD-10-CM | POA: Diagnosis not present

## 2021-05-25 DIAGNOSIS — F17210 Nicotine dependence, cigarettes, uncomplicated: Secondary | ICD-10-CM | POA: Insufficient documentation

## 2021-05-25 DIAGNOSIS — R52 Pain, unspecified: Secondary | ICD-10-CM | POA: Diagnosis not present

## 2021-05-25 DIAGNOSIS — I10 Essential (primary) hypertension: Secondary | ICD-10-CM | POA: Diagnosis not present

## 2021-05-25 DIAGNOSIS — R6889 Other general symptoms and signs: Secondary | ICD-10-CM | POA: Diagnosis not present

## 2021-05-25 DIAGNOSIS — E119 Type 2 diabetes mellitus without complications: Secondary | ICD-10-CM | POA: Insufficient documentation

## 2021-05-25 DIAGNOSIS — M79672 Pain in left foot: Secondary | ICD-10-CM | POA: Diagnosis not present

## 2021-05-25 DIAGNOSIS — W25XXXA Contact with sharp glass, initial encounter: Secondary | ICD-10-CM | POA: Insufficient documentation

## 2021-05-25 DIAGNOSIS — M7989 Other specified soft tissue disorders: Secondary | ICD-10-CM | POA: Diagnosis not present

## 2021-05-25 LAB — CBG MONITORING, ED: Glucose-Capillary: 127 mg/dL — ABNORMAL HIGH (ref 70–99)

## 2021-05-25 MED ORDER — ACETAMINOPHEN 325 MG PO TABS
650.0000 mg | ORAL_TABLET | Freq: Once | ORAL | Status: AC
  Administered 2021-05-25: 650 mg via ORAL
  Filled 2021-05-25: qty 2

## 2021-05-25 MED ORDER — CEPHALEXIN 500 MG PO CAPS: 500.0000 mg | ORAL_CAPSULE | Freq: Three times a day (TID) | ORAL | 0 refills | Status: DC

## 2021-05-25 MED ORDER — CLONAZEPAM 0.5 MG PO TABS
2.0000 mg | ORAL_TABLET | Freq: Once | ORAL | Status: AC
  Administered 2021-05-25: 2 mg via ORAL
  Filled 2021-05-25: qty 4

## 2021-05-25 MED ORDER — CLONIDINE HCL 0.2 MG PO TABS
0.2000 mg | ORAL_TABLET | Freq: Three times a day (TID) | ORAL | 0 refills | Status: AC
Start: 2021-05-25 — End: 2021-06-08

## 2021-05-25 MED ORDER — TETANUS-DIPHTH-ACELL PERTUSSIS 5-2.5-18.5 LF-MCG/0.5 IM SUSY
0.5000 mL | PREFILLED_SYRINGE | Freq: Once | INTRAMUSCULAR | Status: AC
  Administered 2021-05-25: 0.5 mL via INTRAMUSCULAR
  Filled 2021-05-25: qty 0.5

## 2021-05-25 MED ORDER — LIDOCAINE-EPINEPHRINE-TETRACAINE (LET) TOPICAL GEL
3.0000 mL | Freq: Once | TOPICAL | Status: AC
  Administered 2021-05-25: 3 mL via TOPICAL
  Filled 2021-05-25: qty 3

## 2021-05-25 MED ORDER — SULFAMETHOXAZOLE-TRIMETHOPRIM 800-160 MG PO TABS: 1.0000 | ORAL_TABLET | Freq: Two times a day (BID) | ORAL | 0 refills | Status: DC

## 2021-05-25 MED ORDER — SULFAMETHOXAZOLE-TRIMETHOPRIM 800-160 MG PO TABS: 1.0000 | ORAL_TABLET | Freq: Two times a day (BID) | ORAL | 0 refills | Status: AC

## 2021-05-25 MED ORDER — CLONIDINE HCL 0.2 MG PO TABS: 0.2000 mg | ORAL_TABLET | Freq: Three times a day (TID) | ORAL | 0 refills | Status: DC

## 2021-05-25 MED ORDER — CEPHALEXIN 500 MG PO CAPS
500.0000 mg | ORAL_CAPSULE | Freq: Three times a day (TID) | ORAL | 0 refills | Status: AC
Start: 2021-05-25 — End: ?

## 2021-05-25 NOTE — Discharge Instructions (Addendum)
Have your home health provider clean and dress your foot daily and watch for any signs of infection.  A piece of glass was removed from your foot.  2 antibiotics were sent to the pharmacy to help prevent infection.  Also a prescription for your blood pressure medication was sent to the pharmacy for 2 weeks.  You will need to contact your primary care provider for further medication refills and follow-up of your foot wound.  Return to the emergency department if any severe worsening of your symptoms such as fever, chills or drainage from the wound site.

## 2021-05-25 NOTE — ED Notes (Signed)
Per Bjorn Loser, PA-C, Pt given ice water and saltine crackers. Will continue to monitor.

## 2021-05-25 NOTE — ED Notes (Signed)
Pt arguing with nurse and provider because he says he lost other clonidine and he says pharmacy will not dispense clonidine to him because he cannot pay for it. Pt agitated. Pt stating he will have a stroke. Provider talking with pt. Goodrx cards given to pt. Explained to pt we cannot just change ordered dosage and pt needs to see provider as well. Provider ordering one dose for before pt leaves.

## 2021-05-25 NOTE — ED Notes (Signed)
Called [facility] again. She will arrive to pick up pt in about 1 hour with pt wheelchair. [phone].

## 2021-05-25 NOTE — ED Notes (Signed)
Pt still expressing anger and frustration about clonidine. Did not want full dose 2mg  then took it while cursing and complaining. Pt wheeled out in wheelchair.

## 2021-05-25 NOTE — ED Provider Notes (Signed)
[facility] Emergency Department Provider Note  ____________________________________________   Event Date/Time   First MD Initiated Contact with Patient 05/25/21 [phone]     (approximate)  I have reviewed the triage vital signs and the nursing notes.   HISTORY  Chief Complaint Leg Pain   HPI Joshua Gordon is a 65 y.o. male presents to the ED via EMS with complaint of possible foreign body to his left foot that occurred 2 days ago.  Patient believes that he has a piece of glass in it.  He has a home health person who comes in to check on him and states that this was cleaned and a dressing applied.  Patient states he continues to have pain in that area.  He denies any drainage, fever or swelling.  He rates his pain as 10/10.  Patient also states he has not taken his blood pressure medication in 2 days as he left it in a car that was giving him a ride.         Past Medical History:  Diagnosis Date   Chronic pain    Diabetes mellitus without complication (HCC)    Hypertension    Non compliance with medical treatment    Pain management    contract for pain management   Polysubstance abuse [facility])    Renal disorder     Patient Active Problem List   Diagnosis Date Noted   Generalized weakness    Rhabdomyolysis 10/03/2019   Cellulitis 10/03/2019   Hypertension    Diabetes mellitus without complication (HCC)    Chronic pain     Past Surgical History:  Procedure Laterality Date   FEMUR FRACTURE SURGERY Left    KNEE SURGERY Left 1991   5 times   REPLACEMENT TOTAL KNEE Right    twice    Prior to Admission medications   Medication Sig Start Date End Date Taking? Authorizing Provider  ALPRAZolam Prudy Feeler) 1 MG tablet Take 1 tablet (1 mg total) by mouth 2 (two) times daily. 10/06/19   Arnetha Courser, MD  amLODipine (NORVASC) 5 MG tablet Take 5 mg by mouth daily. Patient not taking: No sig reported 09/12/19   [provider]  cephALEXin  (KEFLEX) 500 MG capsule Take 1 capsule (500 mg total) by mouth 3 (three) times daily. 05/25/21   Tommi Rumps, PA-C  cloNIDine (CATAPRES) 0.2 MG tablet Take 1 tablet (0.2 mg total) by mouth 3 (three) times daily for 14 days. 05/25/21 06/08/21  Tommi Rumps, PA-C  esomeprazole (NEXIUM) 40 MG capsule Take 1 capsule (40 mg total) by mouth daily for 14 days. 08/10/20 08/24/20  Concha Se, MD  insulin glargine (LANTUS SOLOSTAR) 100 UNIT/ML Solostar Pen Inject 20 Units into the skin daily at 10 pm for 15 days. 08/10/20 08/25/20  Concha Se, MD  Insulin Pen Needle (PEN NEEDLES) 31G X 5 MM MISC 20 Units by Does not apply route at bedtime. 10/06/19   Arnetha Courser, MD  JANUVIA 50 MG tablet Take 1 tablet (50 mg total) by mouth daily for 14 days. 08/10/20 08/24/20  Concha Se, MD  LORazepam (ATIVAN) 0.5 MG tablet Take 0.5 mg by mouth 2 (two) times daily as needed. 08/07/20   [provider]  LYRICA 150 MG capsule Take 1 capsule (150 mg total) by mouth 2 (two) times daily. Patient not taking: No sig reported 11/18/17   Emily Filbert, MD  metFORMIN (GLUCOPHAGE) 1000 MG tablet Take 1 tablet (1,000 mg  total) by mouth 2 (two) times daily for 14 days. 08/10/20 08/24/20  Concha Se, MD  pregabalin (LYRICA) 200 MG capsule Take 200 mg by mouth 3 (three) times daily. 08/07/20   [provider]  SUBOXONE 8-2 MG FILM Place 2 mg under the tongue 3 (three) times daily. 01/18/16   [provider]  sulfamethoxazole-trimethoprim (BACTRIM DS) 800-160 MG tablet Take 1 tablet by mouth 2 (two) times daily. 05/25/21   Tommi Rumps, PA-C    Allergies Patient has no known allergies.  No family history on file.  Social History Social History   Tobacco Use   Smoking status: Every Day    Packs/day: 0.50    Types: Cigarettes   Smokeless tobacco: Never  Vaping Use   Vaping Use: Never used  Substance Use Topics   Alcohol use: No   Drug use: Yes    Types: Cocaine    Comment:  suboxine    Review of Systems Constitutional: No fever/chills Eyes: No visual changes. Cardiovascular: Denies chest pain. Respiratory: Denies shortness of breath. Gastrointestinal: No abdominal pain.  No nausea, no vomiting. Musculoskeletal: Left foot pain. Skin: Positive laceration with possible foreign body. Neurological: Negative for headaches, focal weakness or numbness. Psychiatric: Polysubstance abuse history. Endocrine: Diabetes mellitus ____________________________________________   PHYSICAL EXAM:  VITAL SIGNS: ED Triage Vitals  Enc Vitals Group     BP 05/25/21 0445 (!) 175/94     Pulse Rate 05/25/21 0445 88     Resp 05/25/21 0445 16     Temp 05/25/21 0445 99.1 F (37.3 C)     Temp Source 05/25/21 0445 Oral     SpO2 05/25/21 0445 97 %     Weight 05/25/21 0434 240 lb (108.9 kg)     Height 05/25/21 0434 6\' 1"  (1.854 m)     Head Circumference --      Peak Flow --      Pain Score 05/25/21 0433 10     Pain Loc --      Pain Edu? --      Excl. in GC? --     Constitutional: Alert and oriented. Well appearing and in no acute distress. Eyes: Conjunctivae are normal.  Head: Atraumatic. Neck: No stridor.   Cardiovascular: Normal rate, regular rhythm. Grossly normal heart sounds.  Good peripheral circulation. Respiratory: Normal respiratory effort.  No retractions. Lungs CTAB. Musculoskeletal: On examination of the left foot there is a chronic deformity of the foot and skin is thick and chronic.  There is a very small superficial laceration measuring approximately 1 cm.  No drainage or purulent material is noted in this area.  There is a sensation of foreign body noted on palpation.  This area was cleaned with normal saline.  A topical anesthetic was applied.  1 small piece of glass was removed from the area.  No other foreign body was noted in the area on palpation and also irrigated with normal saline after removal of glass. Neurologic:  Normal speech and language. No gross  focal neurologic deficits are appreciated. Skin:  Skin is warm, dry.  Left foot as noted above. Psychiatric: Mood and affect are normal. Speech and behavior are normal.  ____________________________________________   LABS (all labs ordered are listed, but only abnormal results are displayed)  Labs Reviewed  CBG MONITORING, ED - Abnormal; Notable for the following components:      Result Value   Glucose-Capillary 127 (*)    All other components within normal limits  ____________________________________________ ___________________________________________  RADIOLOGY Beaulah Corin, personally viewed and evaluated these images (plain radiographs) as part of my medical decision making, as well as reviewing the written report by the radiologist.   Official radiology report(s): DG Foot 2 Views Left  Result Date: 05/25/2021 CLINICAL DATA:  Left foot pain after stepping on glass 2 days ago. EXAM: LEFT FOOT - 2 VIEW COMPARISON:  None. FINDINGS: There is no evidence of acute fracture or dislocation. Severe deformity is seen involving the distal left tibia and fibula consistent with old healed fractures. Linear metallic density is seen in the soft tissues overlying posterior calcaneus consistent with foreign body. Dorsal soft tissue swelling is noted concerning for inflammation. IMPRESSION: No acute fracture or dislocation is noted. Linear metallic density is seen in the soft tissues overlying posterior calcaneus consistent with foreign body. Electronically Signed   By: Lupita Raider M.D.   On: 05/25/2021 05:17    ____________________________________________   PROCEDURES  Procedure(s) performed (including Critical Care):  .Foreign Body Removal  Date/Time: 05/25/2021 8:07 AM Performed by: Tommi Rumps, PA-C Authorized by: Tommi Rumps, PA-C  Consent: Verbal consent obtained. Consent given by: patient Patient understanding: patient states understanding of the procedure  being performed Imaging studies: imaging studies available Patient identity confirmed: verbally with patient Body area: skin General location: lower extremity Location details: left foot  Anesthesia: Local Anesthetic: topical anesthetic and LET (lido,epi,tetracaine)  Sedation: Patient sedated: no  Patient restrained: no Patient cooperative: yes Removal mechanism: forceps Dressing: dressing applied Depth: subcutaneous Complexity: simple 1 objects recovered. Objects recovered: 1 Post-procedure assessment: foreign body removed Patient tolerance: patient tolerated the procedure well with no immediate complications Comments: Class was superficially embedded on the lateral aspect of the left foot.  No bleeding.  No complications from removing this.  Area was irrigated with normal saline after foreign body removal.    ____________________________________________   INITIAL IMPRESSION / ASSESSMENT AND PLAN / ED COURSE  As part of my medical decision making, I reviewed the following data within the electronic MEDICAL RECORD NUMBER Notes from prior ED visits and [place] Controlled Substance Database  65 year old male presents to the ED with 2-day history of foreign body to his left foot.  X-ray shows a metallic foreign body which is not in the area involved with this particular incident today.  A very superficial 1 cm laceration is noted to the lateral aspect of the left foot but not involving the plantar aspect with glass foreign body present.  This was removed without any complications with a forcep.  No purulent drainage or bleeding occurred during removal.  Patient was however covered with Keflex and Bactrim DS as he is diabetic and also foot care may be very difficult for him due to body habitus and range of motion.  He is encouraged to have his home health nurse checked this area and clean it often.  He will follow-up with his PCP if any signs of infection.  Also while he was here a prescription  for clonidine was written for his blood pressure as he has lost his medication.  Prior to discharge patient was very angry that the hospital is not issuing a full prescription and states that he will take this up with his PCP.  He was given 1 pill while in the ED and a good Rx card was given to him should he decide to get the prescription filled.  ____________________________________________   FINAL CLINICAL IMPRESSION(S) / ED DIAGNOSES  Final diagnoses:  Pain  Foreign body in left foot, initial encounter     ED Discharge Orders          Ordered    cloNIDine (CATAPRES) 0.2 MG tablet  3 times daily,   Status:  Discontinued        05/25/21 1017    cephALEXin (KEFLEX) 500 MG capsule  3 times daily,   Status:  Discontinued        05/25/21 1017    sulfamethoxazole-trimethoprim (BACTRIM DS) 800-160 MG tablet  2 times daily,   Status:  Discontinued        05/25/21 1017    cephALEXin (KEFLEX) 500 MG capsule  3 times daily        05/25/21 1021    cloNIDine (CATAPRES) 0.2 MG tablet  3 times daily        05/25/21 1021    sulfamethoxazole-trimethoprim (BACTRIM DS) 800-160 MG tablet  2 times daily        05/25/21 1021             Note:  This document was prepared using Dragon voice recognition software and may include unintentional dictation errors.    Tommi Rumps, PA-C 05/25/21 1208    Arnaldo Natal, MD 05/25/21 [phone]

## 2021-05-25 NOTE — ED Notes (Signed)
Placed non ahderent dressing on wound on L foot. Wrapped in gauze.  Pt resting in bed.

## 2021-05-25 NOTE — ED Notes (Signed)
Provided phone to pt who is talking with Lawanna Kobus, home health aid to help arrange transport home and to bring wheelchair.

## 2021-05-25 NOTE — ED Triage Notes (Signed)
Pt presents to ER from home via EMS with complaints of left foot pain, reports about 2 days ago he stepped on a pice of glass and his HHRN cleaned the area reports he developed shooting pain. Pt also reports he has not had his blood pressure medicine because because he forgot in the car that gave him a ride to the store. Pt talks in complete sentences, no respiratory distress noted

## 2021-05-27 DIAGNOSIS — F17210 Nicotine dependence, cigarettes, uncomplicated: Secondary | ICD-10-CM | POA: Diagnosis not present

## 2021-05-27 DIAGNOSIS — I10 Essential (primary) hypertension: Secondary | ICD-10-CM | POA: Diagnosis not present

## 2021-05-27 DIAGNOSIS — I890 Lymphedema, not elsewhere classified: Secondary | ICD-10-CM | POA: Diagnosis not present

## 2021-06-03 DIAGNOSIS — I10 Essential (primary) hypertension: Secondary | ICD-10-CM | POA: Diagnosis not present

## 2021-06-03 DIAGNOSIS — F17210 Nicotine dependence, cigarettes, uncomplicated: Secondary | ICD-10-CM | POA: Diagnosis not present

## 2021-06-11 DIAGNOSIS — Z79891 Long term (current) use of opiate analgesic: Secondary | ICD-10-CM | POA: Diagnosis not present

## 2021-06-11 DIAGNOSIS — E1140 Type 2 diabetes mellitus with diabetic neuropathy, unspecified: Secondary | ICD-10-CM | POA: Diagnosis not present

## 2021-06-11 DIAGNOSIS — I10 Essential (primary) hypertension: Secondary | ICD-10-CM | POA: Diagnosis not present

## 2022-07-16 DIAGNOSIS — Z1331 Encounter for screening for depression: Secondary | ICD-10-CM | POA: Diagnosis not present

## 2022-07-16 DIAGNOSIS — F17210 Nicotine dependence, cigarettes, uncomplicated: Secondary | ICD-10-CM | POA: Diagnosis not present

## 2022-07-16 DIAGNOSIS — Z716 Tobacco abuse counseling: Secondary | ICD-10-CM | POA: Diagnosis not present

## 2022-07-16 DIAGNOSIS — R69 Illness, unspecified: Secondary | ICD-10-CM | POA: Diagnosis not present

## 2022-07-16 DIAGNOSIS — G629 Polyneuropathy, unspecified: Secondary | ICD-10-CM | POA: Diagnosis not present

## 2022-07-16 DIAGNOSIS — F1120 Opioid dependence, uncomplicated: Secondary | ICD-10-CM | POA: Diagnosis not present

## 2022-07-16 DIAGNOSIS — Z79899 Other long term (current) drug therapy: Secondary | ICD-10-CM | POA: Diagnosis not present

## 2022-07-23 DIAGNOSIS — R69 Illness, unspecified: Secondary | ICD-10-CM | POA: Diagnosis not present

## 2022-07-23 DIAGNOSIS — F1120 Opioid dependence, uncomplicated: Secondary | ICD-10-CM | POA: Diagnosis not present

## 2022-07-23 DIAGNOSIS — Z1331 Encounter for screening for depression: Secondary | ICD-10-CM | POA: Diagnosis not present

## 2022-07-23 DIAGNOSIS — F17210 Nicotine dependence, cigarettes, uncomplicated: Secondary | ICD-10-CM | POA: Diagnosis not present

## 2022-07-30 DIAGNOSIS — F1120 Opioid dependence, uncomplicated: Secondary | ICD-10-CM | POA: Diagnosis not present

## 2022-07-30 DIAGNOSIS — F17210 Nicotine dependence, cigarettes, uncomplicated: Secondary | ICD-10-CM | POA: Diagnosis not present

## 2022-07-30 DIAGNOSIS — F1410 Cocaine abuse, uncomplicated: Secondary | ICD-10-CM | POA: Diagnosis not present

## 2022-07-30 DIAGNOSIS — Z1331 Encounter for screening for depression: Secondary | ICD-10-CM | POA: Diagnosis not present

## 2022-07-30 DIAGNOSIS — R69 Illness, unspecified: Secondary | ICD-10-CM | POA: Diagnosis not present

## 2022-08-03 DIAGNOSIS — F17210 Nicotine dependence, cigarettes, uncomplicated: Secondary | ICD-10-CM | POA: Diagnosis not present

## 2022-08-03 DIAGNOSIS — F1120 Opioid dependence, uncomplicated: Secondary | ICD-10-CM | POA: Diagnosis not present

## 2022-08-03 DIAGNOSIS — F11129 Opioid abuse with intoxication, unspecified: Secondary | ICD-10-CM | POA: Diagnosis not present

## 2022-08-03 DIAGNOSIS — R69 Illness, unspecified: Secondary | ICD-10-CM | POA: Diagnosis not present

## 2022-08-03 DIAGNOSIS — F1110 Opioid abuse, uncomplicated: Secondary | ICD-10-CM | POA: Diagnosis not present

## 2022-08-03 DIAGNOSIS — F411 Generalized anxiety disorder: Secondary | ICD-10-CM | POA: Diagnosis not present

## 2022-08-06 DIAGNOSIS — F1110 Opioid abuse, uncomplicated: Secondary | ICD-10-CM | POA: Diagnosis not present

## 2022-08-06 DIAGNOSIS — M79604 Pain in right leg: Secondary | ICD-10-CM | POA: Diagnosis not present

## 2022-08-06 DIAGNOSIS — R69 Illness, unspecified: Secondary | ICD-10-CM | POA: Diagnosis not present

## 2022-08-06 DIAGNOSIS — F1120 Opioid dependence, uncomplicated: Secondary | ICD-10-CM | POA: Diagnosis not present

## 2022-08-06 DIAGNOSIS — F1410 Cocaine abuse, uncomplicated: Secondary | ICD-10-CM | POA: Diagnosis not present

## 2022-08-06 DIAGNOSIS — F17210 Nicotine dependence, cigarettes, uncomplicated: Secondary | ICD-10-CM | POA: Diagnosis not present

## 2022-08-06 DIAGNOSIS — Z1331 Encounter for screening for depression: Secondary | ICD-10-CM | POA: Diagnosis not present

## 2022-08-13 DIAGNOSIS — Z1331 Encounter for screening for depression: Secondary | ICD-10-CM | POA: Diagnosis not present

## 2022-08-13 DIAGNOSIS — F1120 Opioid dependence, uncomplicated: Secondary | ICD-10-CM | POA: Diagnosis not present

## 2022-08-13 DIAGNOSIS — R69 Illness, unspecified: Secondary | ICD-10-CM | POA: Diagnosis not present

## 2022-08-13 DIAGNOSIS — F1410 Cocaine abuse, uncomplicated: Secondary | ICD-10-CM | POA: Diagnosis not present

## 2022-08-13 DIAGNOSIS — F1110 Opioid abuse, uncomplicated: Secondary | ICD-10-CM | POA: Diagnosis not present

## 2022-08-13 DIAGNOSIS — M79604 Pain in right leg: Secondary | ICD-10-CM | POA: Diagnosis not present

## 2022-08-20 DIAGNOSIS — F17210 Nicotine dependence, cigarettes, uncomplicated: Secondary | ICD-10-CM | POA: Diagnosis not present

## 2022-08-20 DIAGNOSIS — F1410 Cocaine abuse, uncomplicated: Secondary | ICD-10-CM | POA: Diagnosis not present

## 2022-08-20 DIAGNOSIS — F1120 Opioid dependence, uncomplicated: Secondary | ICD-10-CM | POA: Diagnosis not present

## 2022-08-20 DIAGNOSIS — F411 Generalized anxiety disorder: Secondary | ICD-10-CM | POA: Diagnosis not present

## 2022-08-20 DIAGNOSIS — R69 Illness, unspecified: Secondary | ICD-10-CM | POA: Diagnosis not present

## 2022-08-27 DIAGNOSIS — Z1331 Encounter for screening for depression: Secondary | ICD-10-CM | POA: Diagnosis not present

## 2022-08-27 DIAGNOSIS — F1410 Cocaine abuse, uncomplicated: Secondary | ICD-10-CM | POA: Diagnosis not present

## 2022-08-27 DIAGNOSIS — R69 Illness, unspecified: Secondary | ICD-10-CM | POA: Diagnosis not present

## 2022-08-27 DIAGNOSIS — F1120 Opioid dependence, uncomplicated: Secondary | ICD-10-CM | POA: Diagnosis not present

## 2022-08-27 DIAGNOSIS — F1110 Opioid abuse, uncomplicated: Secondary | ICD-10-CM | POA: Diagnosis not present

## 2022-08-27 DIAGNOSIS — Z202 Contact with and (suspected) exposure to infections with a predominantly sexual mode of transmission: Secondary | ICD-10-CM | POA: Diagnosis not present

## 2022-08-27 DIAGNOSIS — M79604 Pain in right leg: Secondary | ICD-10-CM | POA: Diagnosis not present

## 2022-09-03 DIAGNOSIS — R69 Illness, unspecified: Secondary | ICD-10-CM | POA: Diagnosis not present

## 2022-09-03 DIAGNOSIS — M79604 Pain in right leg: Secondary | ICD-10-CM | POA: Diagnosis not present

## 2022-09-03 DIAGNOSIS — F1120 Opioid dependence, uncomplicated: Secondary | ICD-10-CM | POA: Diagnosis not present

## 2022-09-03 DIAGNOSIS — F17210 Nicotine dependence, cigarettes, uncomplicated: Secondary | ICD-10-CM | POA: Diagnosis not present

## 2022-09-03 DIAGNOSIS — F1410 Cocaine abuse, uncomplicated: Secondary | ICD-10-CM | POA: Diagnosis not present

## 2022-09-03 DIAGNOSIS — Z1331 Encounter for screening for depression: Secondary | ICD-10-CM | POA: Diagnosis not present

## 2022-09-03 DIAGNOSIS — F1110 Opioid abuse, uncomplicated: Secondary | ICD-10-CM | POA: Diagnosis not present

## 2022-09-10 DIAGNOSIS — Z202 Contact with and (suspected) exposure to infections with a predominantly sexual mode of transmission: Secondary | ICD-10-CM | POA: Diagnosis not present

## 2022-09-10 DIAGNOSIS — F1120 Opioid dependence, uncomplicated: Secondary | ICD-10-CM | POA: Diagnosis not present

## 2022-09-10 DIAGNOSIS — R350 Frequency of micturition: Secondary | ICD-10-CM | POA: Diagnosis not present

## 2022-09-10 DIAGNOSIS — F17210 Nicotine dependence, cigarettes, uncomplicated: Secondary | ICD-10-CM | POA: Diagnosis not present

## 2022-09-10 DIAGNOSIS — Z716 Tobacco abuse counseling: Secondary | ICD-10-CM | POA: Diagnosis not present

## 2022-09-10 DIAGNOSIS — F1110 Opioid abuse, uncomplicated: Secondary | ICD-10-CM | POA: Diagnosis not present

## 2022-09-10 DIAGNOSIS — N390 Urinary tract infection, site not specified: Secondary | ICD-10-CM | POA: Diagnosis not present

## 2022-09-10 DIAGNOSIS — R69 Illness, unspecified: Secondary | ICD-10-CM | POA: Diagnosis not present

## 2022-09-17 DIAGNOSIS — R69 Illness, unspecified: Secondary | ICD-10-CM | POA: Diagnosis not present

## 2022-09-17 DIAGNOSIS — F1120 Opioid dependence, uncomplicated: Secondary | ICD-10-CM | POA: Diagnosis not present

## 2022-09-17 DIAGNOSIS — F17210 Nicotine dependence, cigarettes, uncomplicated: Secondary | ICD-10-CM | POA: Diagnosis not present

## 2022-09-17 DIAGNOSIS — F411 Generalized anxiety disorder: Secondary | ICD-10-CM | POA: Diagnosis not present

## 2022-09-17 DIAGNOSIS — F1110 Opioid abuse, uncomplicated: Secondary | ICD-10-CM | POA: Diagnosis not present

## 2022-09-17 DIAGNOSIS — F1910 Other psychoactive substance abuse, uncomplicated: Secondary | ICD-10-CM | POA: Diagnosis not present

## 2022-09-22 DIAGNOSIS — R69 Illness, unspecified: Secondary | ICD-10-CM | POA: Diagnosis not present

## 2022-09-22 DIAGNOSIS — F1120 Opioid dependence, uncomplicated: Secondary | ICD-10-CM | POA: Diagnosis not present

## 2022-09-22 DIAGNOSIS — F1410 Cocaine abuse, uncomplicated: Secondary | ICD-10-CM | POA: Diagnosis not present

## 2022-09-24 DIAGNOSIS — F17210 Nicotine dependence, cigarettes, uncomplicated: Secondary | ICD-10-CM | POA: Diagnosis not present

## 2022-09-24 DIAGNOSIS — Z1331 Encounter for screening for depression: Secondary | ICD-10-CM | POA: Diagnosis not present

## 2022-09-24 DIAGNOSIS — R69 Illness, unspecified: Secondary | ICD-10-CM | POA: Diagnosis not present

## 2022-10-01 DIAGNOSIS — F1120 Opioid dependence, uncomplicated: Secondary | ICD-10-CM | POA: Diagnosis not present

## 2022-10-01 DIAGNOSIS — M79605 Pain in left leg: Secondary | ICD-10-CM | POA: Diagnosis not present

## 2022-10-01 DIAGNOSIS — M79604 Pain in right leg: Secondary | ICD-10-CM | POA: Diagnosis not present

## 2022-10-01 DIAGNOSIS — F17210 Nicotine dependence, cigarettes, uncomplicated: Secondary | ICD-10-CM | POA: Diagnosis not present

## 2022-10-02 DIAGNOSIS — F1120 Opioid dependence, uncomplicated: Secondary | ICD-10-CM | POA: Diagnosis not present

## 2022-10-02 DIAGNOSIS — F1110 Opioid abuse, uncomplicated: Secondary | ICD-10-CM | POA: Diagnosis not present

## 2022-10-08 DIAGNOSIS — M79604 Pain in right leg: Secondary | ICD-10-CM | POA: Diagnosis not present

## 2022-10-08 DIAGNOSIS — Z1331 Encounter for screening for depression: Secondary | ICD-10-CM | POA: Diagnosis not present

## 2022-10-08 DIAGNOSIS — F1110 Opioid abuse, uncomplicated: Secondary | ICD-10-CM | POA: Diagnosis not present

## 2022-10-08 DIAGNOSIS — F1120 Opioid dependence, uncomplicated: Secondary | ICD-10-CM | POA: Diagnosis not present

## 2022-10-08 DIAGNOSIS — F17210 Nicotine dependence, cigarettes, uncomplicated: Secondary | ICD-10-CM | POA: Diagnosis not present

## 2022-10-12 DIAGNOSIS — F1410 Cocaine abuse, uncomplicated: Secondary | ICD-10-CM | POA: Diagnosis not present

## 2022-10-12 DIAGNOSIS — F1120 Opioid dependence, uncomplicated: Secondary | ICD-10-CM | POA: Diagnosis not present

## 2022-10-15 DIAGNOSIS — F1120 Opioid dependence, uncomplicated: Secondary | ICD-10-CM | POA: Diagnosis not present

## 2022-10-15 DIAGNOSIS — Z1331 Encounter for screening for depression: Secondary | ICD-10-CM | POA: Diagnosis not present

## 2022-10-15 DIAGNOSIS — M79604 Pain in right leg: Secondary | ICD-10-CM | POA: Diagnosis not present

## 2022-10-15 DIAGNOSIS — F17210 Nicotine dependence, cigarettes, uncomplicated: Secondary | ICD-10-CM | POA: Diagnosis not present

## 2022-10-16 DIAGNOSIS — F1110 Opioid abuse, uncomplicated: Secondary | ICD-10-CM | POA: Diagnosis not present

## 2022-10-16 DIAGNOSIS — F1120 Opioid dependence, uncomplicated: Secondary | ICD-10-CM | POA: Diagnosis not present

## 2022-10-22 DIAGNOSIS — F1120 Opioid dependence, uncomplicated: Secondary | ICD-10-CM | POA: Diagnosis not present

## 2022-10-22 DIAGNOSIS — F1410 Cocaine abuse, uncomplicated: Secondary | ICD-10-CM | POA: Diagnosis not present

## 2022-10-22 DIAGNOSIS — F1110 Opioid abuse, uncomplicated: Secondary | ICD-10-CM | POA: Diagnosis not present

## 2022-10-22 DIAGNOSIS — F17210 Nicotine dependence, cigarettes, uncomplicated: Secondary | ICD-10-CM | POA: Diagnosis not present

## 2022-10-22 DIAGNOSIS — F411 Generalized anxiety disorder: Secondary | ICD-10-CM | POA: Diagnosis not present

## 2022-10-22 DIAGNOSIS — M79604 Pain in right leg: Secondary | ICD-10-CM | POA: Diagnosis not present

## 2022-10-22 DIAGNOSIS — M79605 Pain in left leg: Secondary | ICD-10-CM | POA: Diagnosis not present

## 2022-10-26 DIAGNOSIS — F411 Generalized anxiety disorder: Secondary | ICD-10-CM | POA: Diagnosis not present

## 2022-10-26 DIAGNOSIS — F11129 Opioid abuse with intoxication, unspecified: Secondary | ICD-10-CM | POA: Diagnosis not present

## 2022-10-26 DIAGNOSIS — F17210 Nicotine dependence, cigarettes, uncomplicated: Secondary | ICD-10-CM | POA: Diagnosis not present

## 2022-10-28 DIAGNOSIS — F411 Generalized anxiety disorder: Secondary | ICD-10-CM | POA: Diagnosis not present

## 2022-10-28 DIAGNOSIS — M79604 Pain in right leg: Secondary | ICD-10-CM | POA: Diagnosis not present

## 2022-10-28 DIAGNOSIS — F1120 Opioid dependence, uncomplicated: Secondary | ICD-10-CM | POA: Diagnosis not present

## 2022-10-28 DIAGNOSIS — R600 Localized edema: Secondary | ICD-10-CM | POA: Diagnosis not present

## 2022-10-28 DIAGNOSIS — F17210 Nicotine dependence, cigarettes, uncomplicated: Secondary | ICD-10-CM | POA: Diagnosis not present

## 2022-10-28 DIAGNOSIS — F1410 Cocaine abuse, uncomplicated: Secondary | ICD-10-CM | POA: Diagnosis not present

## 2022-10-28 DIAGNOSIS — F1910 Other psychoactive substance abuse, uncomplicated: Secondary | ICD-10-CM | POA: Diagnosis not present

## 2022-10-29 DIAGNOSIS — F17210 Nicotine dependence, cigarettes, uncomplicated: Secondary | ICD-10-CM | POA: Diagnosis not present

## 2022-10-29 DIAGNOSIS — F1410 Cocaine abuse, uncomplicated: Secondary | ICD-10-CM | POA: Diagnosis not present

## 2022-10-29 DIAGNOSIS — Z1331 Encounter for screening for depression: Secondary | ICD-10-CM | POA: Diagnosis not present

## 2022-10-29 DIAGNOSIS — F1120 Opioid dependence, uncomplicated: Secondary | ICD-10-CM | POA: Diagnosis not present

## 2022-10-29 DIAGNOSIS — F1910 Other psychoactive substance abuse, uncomplicated: Secondary | ICD-10-CM | POA: Diagnosis not present

## 2022-10-29 DIAGNOSIS — M79604 Pain in right leg: Secondary | ICD-10-CM | POA: Diagnosis not present

## 2022-10-29 DIAGNOSIS — F411 Generalized anxiety disorder: Secondary | ICD-10-CM | POA: Diagnosis not present

## 2022-10-30 DIAGNOSIS — F1120 Opioid dependence, uncomplicated: Secondary | ICD-10-CM | POA: Diagnosis not present

## 2022-10-30 DIAGNOSIS — F1110 Opioid abuse, uncomplicated: Secondary | ICD-10-CM | POA: Diagnosis not present

## 2022-11-05 DIAGNOSIS — F1120 Opioid dependence, uncomplicated: Secondary | ICD-10-CM | POA: Diagnosis not present

## 2022-11-05 DIAGNOSIS — F1910 Other psychoactive substance abuse, uncomplicated: Secondary | ICD-10-CM | POA: Diagnosis not present

## 2022-11-05 DIAGNOSIS — M79604 Pain in right leg: Secondary | ICD-10-CM | POA: Diagnosis not present

## 2022-11-05 DIAGNOSIS — F411 Generalized anxiety disorder: Secondary | ICD-10-CM | POA: Diagnosis not present

## 2022-11-05 DIAGNOSIS — F17210 Nicotine dependence, cigarettes, uncomplicated: Secondary | ICD-10-CM | POA: Diagnosis not present

## 2022-11-05 DIAGNOSIS — Z1331 Encounter for screening for depression: Secondary | ICD-10-CM | POA: Diagnosis not present

## 2022-11-05 DIAGNOSIS — F1110 Opioid abuse, uncomplicated: Secondary | ICD-10-CM | POA: Diagnosis not present

## 2022-11-05 DIAGNOSIS — F1410 Cocaine abuse, uncomplicated: Secondary | ICD-10-CM | POA: Diagnosis not present

## 2022-11-12 DIAGNOSIS — F1120 Opioid dependence, uncomplicated: Secondary | ICD-10-CM | POA: Diagnosis not present

## 2022-11-12 DIAGNOSIS — Z1331 Encounter for screening for depression: Secondary | ICD-10-CM | POA: Diagnosis not present

## 2022-11-12 DIAGNOSIS — F1410 Cocaine abuse, uncomplicated: Secondary | ICD-10-CM | POA: Diagnosis not present

## 2022-11-12 DIAGNOSIS — F411 Generalized anxiety disorder: Secondary | ICD-10-CM | POA: Diagnosis not present

## 2022-11-12 DIAGNOSIS — F17210 Nicotine dependence, cigarettes, uncomplicated: Secondary | ICD-10-CM | POA: Diagnosis not present

## 2022-11-13 DIAGNOSIS — F1120 Opioid dependence, uncomplicated: Secondary | ICD-10-CM | POA: Diagnosis not present

## 2022-11-13 DIAGNOSIS — F1110 Opioid abuse, uncomplicated: Secondary | ICD-10-CM | POA: Diagnosis not present

## 2022-11-16 DIAGNOSIS — F11129 Opioid abuse with intoxication, unspecified: Secondary | ICD-10-CM | POA: Diagnosis not present

## 2022-11-16 DIAGNOSIS — Z653 Problems related to other legal circumstances: Secondary | ICD-10-CM | POA: Diagnosis not present

## 2022-11-16 DIAGNOSIS — F17210 Nicotine dependence, cigarettes, uncomplicated: Secondary | ICD-10-CM | POA: Diagnosis not present

## 2022-11-16 DIAGNOSIS — F411 Generalized anxiety disorder: Secondary | ICD-10-CM | POA: Diagnosis not present

## 2022-11-19 DIAGNOSIS — F17210 Nicotine dependence, cigarettes, uncomplicated: Secondary | ICD-10-CM | POA: Diagnosis not present

## 2022-11-19 DIAGNOSIS — F1410 Cocaine abuse, uncomplicated: Secondary | ICD-10-CM | POA: Diagnosis not present

## 2022-11-19 DIAGNOSIS — F1120 Opioid dependence, uncomplicated: Secondary | ICD-10-CM | POA: Diagnosis not present

## 2022-11-19 DIAGNOSIS — Z1331 Encounter for screening for depression: Secondary | ICD-10-CM | POA: Diagnosis not present

## 2022-11-19 DIAGNOSIS — F1110 Opioid abuse, uncomplicated: Secondary | ICD-10-CM | POA: Diagnosis not present

## 2022-11-26 DIAGNOSIS — F17210 Nicotine dependence, cigarettes, uncomplicated: Secondary | ICD-10-CM | POA: Diagnosis not present

## 2022-11-26 DIAGNOSIS — F1410 Cocaine abuse, uncomplicated: Secondary | ICD-10-CM | POA: Diagnosis not present

## 2022-11-26 DIAGNOSIS — F1110 Opioid abuse, uncomplicated: Secondary | ICD-10-CM | POA: Diagnosis not present

## 2022-11-26 DIAGNOSIS — F1120 Opioid dependence, uncomplicated: Secondary | ICD-10-CM | POA: Diagnosis not present

## 2022-11-26 DIAGNOSIS — Z1331 Encounter for screening for depression: Secondary | ICD-10-CM | POA: Diagnosis not present

## 2022-11-30 DIAGNOSIS — F1410 Cocaine abuse, uncomplicated: Secondary | ICD-10-CM | POA: Diagnosis not present

## 2022-11-30 DIAGNOSIS — F1120 Opioid dependence, uncomplicated: Secondary | ICD-10-CM | POA: Diagnosis not present

## 2022-12-03 DIAGNOSIS — F17210 Nicotine dependence, cigarettes, uncomplicated: Secondary | ICD-10-CM | POA: Diagnosis not present

## 2022-12-03 DIAGNOSIS — Z1331 Encounter for screening for depression: Secondary | ICD-10-CM | POA: Diagnosis not present

## 2022-12-03 DIAGNOSIS — F1120 Opioid dependence, uncomplicated: Secondary | ICD-10-CM | POA: Diagnosis not present

## 2022-12-04 DIAGNOSIS — F1110 Opioid abuse, uncomplicated: Secondary | ICD-10-CM | POA: Diagnosis not present

## 2022-12-04 DIAGNOSIS — F1120 Opioid dependence, uncomplicated: Secondary | ICD-10-CM | POA: Diagnosis not present

## 2022-12-07 DIAGNOSIS — F1410 Cocaine abuse, uncomplicated: Secondary | ICD-10-CM | POA: Diagnosis not present

## 2022-12-07 DIAGNOSIS — F1120 Opioid dependence, uncomplicated: Secondary | ICD-10-CM | POA: Diagnosis not present

## 2022-12-10 DIAGNOSIS — F17210 Nicotine dependence, cigarettes, uncomplicated: Secondary | ICD-10-CM | POA: Diagnosis not present

## 2022-12-10 DIAGNOSIS — Z1331 Encounter for screening for depression: Secondary | ICD-10-CM | POA: Diagnosis not present

## 2022-12-10 DIAGNOSIS — F1410 Cocaine abuse, uncomplicated: Secondary | ICD-10-CM | POA: Diagnosis not present

## 2022-12-10 DIAGNOSIS — F1120 Opioid dependence, uncomplicated: Secondary | ICD-10-CM | POA: Diagnosis not present

## 2022-12-10 DIAGNOSIS — F1110 Opioid abuse, uncomplicated: Secondary | ICD-10-CM | POA: Diagnosis not present

## 2022-12-14 DIAGNOSIS — F1410 Cocaine abuse, uncomplicated: Secondary | ICD-10-CM | POA: Diagnosis not present

## 2022-12-14 DIAGNOSIS — F1120 Opioid dependence, uncomplicated: Secondary | ICD-10-CM | POA: Diagnosis not present

## 2022-12-16 DIAGNOSIS — F17210 Nicotine dependence, cigarettes, uncomplicated: Secondary | ICD-10-CM | POA: Diagnosis not present

## 2022-12-16 DIAGNOSIS — F1120 Opioid dependence, uncomplicated: Secondary | ICD-10-CM | POA: Diagnosis not present

## 2022-12-16 DIAGNOSIS — F1410 Cocaine abuse, uncomplicated: Secondary | ICD-10-CM | POA: Diagnosis not present

## 2022-12-16 DIAGNOSIS — Z1331 Encounter for screening for depression: Secondary | ICD-10-CM | POA: Diagnosis not present

## 2022-12-24 DIAGNOSIS — F1410 Cocaine abuse, uncomplicated: Secondary | ICD-10-CM | POA: Diagnosis not present

## 2022-12-24 DIAGNOSIS — Z1331 Encounter for screening for depression: Secondary | ICD-10-CM | POA: Diagnosis not present

## 2022-12-24 DIAGNOSIS — F17210 Nicotine dependence, cigarettes, uncomplicated: Secondary | ICD-10-CM | POA: Diagnosis not present

## 2022-12-24 DIAGNOSIS — F1110 Opioid abuse, uncomplicated: Secondary | ICD-10-CM | POA: Diagnosis not present

## 2022-12-24 DIAGNOSIS — F1120 Opioid dependence, uncomplicated: Secondary | ICD-10-CM | POA: Diagnosis not present

## 2022-12-28 DIAGNOSIS — F1410 Cocaine abuse, uncomplicated: Secondary | ICD-10-CM | POA: Diagnosis not present

## 2022-12-28 DIAGNOSIS — F1120 Opioid dependence, uncomplicated: Secondary | ICD-10-CM | POA: Diagnosis not present

## 2022-12-31 DIAGNOSIS — F1410 Cocaine abuse, uncomplicated: Secondary | ICD-10-CM | POA: Diagnosis not present

## 2022-12-31 DIAGNOSIS — F1120 Opioid dependence, uncomplicated: Secondary | ICD-10-CM | POA: Diagnosis not present

## 2022-12-31 DIAGNOSIS — F17210 Nicotine dependence, cigarettes, uncomplicated: Secondary | ICD-10-CM | POA: Diagnosis not present

## 2022-12-31 DIAGNOSIS — Z1331 Encounter for screening for depression: Secondary | ICD-10-CM | POA: Diagnosis not present

## 2023-01-01 DIAGNOSIS — F1110 Opioid abuse, uncomplicated: Secondary | ICD-10-CM | POA: Diagnosis not present

## 2023-01-01 DIAGNOSIS — F1120 Opioid dependence, uncomplicated: Secondary | ICD-10-CM | POA: Diagnosis not present

## 2023-01-05 DIAGNOSIS — F1120 Opioid dependence, uncomplicated: Secondary | ICD-10-CM | POA: Diagnosis not present

## 2023-01-05 DIAGNOSIS — F1410 Cocaine abuse, uncomplicated: Secondary | ICD-10-CM | POA: Diagnosis not present

## 2023-01-07 DIAGNOSIS — Z1331 Encounter for screening for depression: Secondary | ICD-10-CM | POA: Diagnosis not present

## 2023-01-07 DIAGNOSIS — F1110 Opioid abuse, uncomplicated: Secondary | ICD-10-CM | POA: Diagnosis not present

## 2023-01-07 DIAGNOSIS — F17210 Nicotine dependence, cigarettes, uncomplicated: Secondary | ICD-10-CM | POA: Diagnosis not present

## 2023-01-07 DIAGNOSIS — F1410 Cocaine abuse, uncomplicated: Secondary | ICD-10-CM | POA: Diagnosis not present

## 2023-01-07 DIAGNOSIS — F1120 Opioid dependence, uncomplicated: Secondary | ICD-10-CM | POA: Diagnosis not present

## 2023-01-11 DIAGNOSIS — F1410 Cocaine abuse, uncomplicated: Secondary | ICD-10-CM | POA: Diagnosis not present

## 2023-01-11 DIAGNOSIS — F1120 Opioid dependence, uncomplicated: Secondary | ICD-10-CM | POA: Diagnosis not present

## 2023-01-15 DIAGNOSIS — F17210 Nicotine dependence, cigarettes, uncomplicated: Secondary | ICD-10-CM | POA: Diagnosis not present

## 2023-01-15 DIAGNOSIS — Z1331 Encounter for screening for depression: Secondary | ICD-10-CM | POA: Diagnosis not present

## 2023-01-15 DIAGNOSIS — F1120 Opioid dependence, uncomplicated: Secondary | ICD-10-CM | POA: Diagnosis not present

## 2023-01-15 DIAGNOSIS — F1410 Cocaine abuse, uncomplicated: Secondary | ICD-10-CM | POA: Diagnosis not present

## 2023-01-18 DIAGNOSIS — F1120 Opioid dependence, uncomplicated: Secondary | ICD-10-CM | POA: Diagnosis not present

## 2023-01-18 DIAGNOSIS — F1410 Cocaine abuse, uncomplicated: Secondary | ICD-10-CM | POA: Diagnosis not present

## 2023-01-22 DIAGNOSIS — Z1331 Encounter for screening for depression: Secondary | ICD-10-CM | POA: Diagnosis not present

## 2023-01-22 DIAGNOSIS — F1410 Cocaine abuse, uncomplicated: Secondary | ICD-10-CM | POA: Diagnosis not present

## 2023-01-22 DIAGNOSIS — F1120 Opioid dependence, uncomplicated: Secondary | ICD-10-CM | POA: Diagnosis not present

## 2023-01-22 DIAGNOSIS — F17210 Nicotine dependence, cigarettes, uncomplicated: Secondary | ICD-10-CM | POA: Diagnosis not present

## 2023-01-25 DIAGNOSIS — F1410 Cocaine abuse, uncomplicated: Secondary | ICD-10-CM | POA: Diagnosis not present

## 2023-01-25 DIAGNOSIS — F1120 Opioid dependence, uncomplicated: Secondary | ICD-10-CM | POA: Diagnosis not present

## 2023-01-29 DIAGNOSIS — F17210 Nicotine dependence, cigarettes, uncomplicated: Secondary | ICD-10-CM | POA: Diagnosis not present

## 2023-01-29 DIAGNOSIS — Z1331 Encounter for screening for depression: Secondary | ICD-10-CM | POA: Diagnosis not present

## 2023-01-29 DIAGNOSIS — F1120 Opioid dependence, uncomplicated: Secondary | ICD-10-CM | POA: Diagnosis not present

## 2023-02-01 DIAGNOSIS — F1120 Opioid dependence, uncomplicated: Secondary | ICD-10-CM | POA: Diagnosis not present

## 2023-02-01 DIAGNOSIS — F1410 Cocaine abuse, uncomplicated: Secondary | ICD-10-CM | POA: Diagnosis not present

## 2023-02-05 DIAGNOSIS — F17210 Nicotine dependence, cigarettes, uncomplicated: Secondary | ICD-10-CM | POA: Diagnosis not present

## 2023-02-05 DIAGNOSIS — F1120 Opioid dependence, uncomplicated: Secondary | ICD-10-CM | POA: Diagnosis not present

## 2023-02-05 DIAGNOSIS — F1410 Cocaine abuse, uncomplicated: Secondary | ICD-10-CM | POA: Diagnosis not present

## 2023-02-05 DIAGNOSIS — Z1331 Encounter for screening for depression: Secondary | ICD-10-CM | POA: Diagnosis not present

## 2023-02-08 DIAGNOSIS — F1120 Opioid dependence, uncomplicated: Secondary | ICD-10-CM | POA: Diagnosis not present

## 2023-02-08 DIAGNOSIS — F1410 Cocaine abuse, uncomplicated: Secondary | ICD-10-CM | POA: Diagnosis not present

## 2023-02-12 DIAGNOSIS — F1120 Opioid dependence, uncomplicated: Secondary | ICD-10-CM | POA: Diagnosis not present

## 2023-02-12 DIAGNOSIS — R52 Pain, unspecified: Secondary | ICD-10-CM | POA: Diagnosis not present

## 2023-02-12 DIAGNOSIS — F17210 Nicotine dependence, cigarettes, uncomplicated: Secondary | ICD-10-CM | POA: Diagnosis not present

## 2023-02-12 DIAGNOSIS — Z1331 Encounter for screening for depression: Secondary | ICD-10-CM | POA: Diagnosis not present

## 2023-02-15 DIAGNOSIS — F1120 Opioid dependence, uncomplicated: Secondary | ICD-10-CM | POA: Diagnosis not present

## 2023-02-15 DIAGNOSIS — F1410 Cocaine abuse, uncomplicated: Secondary | ICD-10-CM | POA: Diagnosis not present

## 2023-02-19 DIAGNOSIS — F17210 Nicotine dependence, cigarettes, uncomplicated: Secondary | ICD-10-CM | POA: Diagnosis not present

## 2023-02-19 DIAGNOSIS — F1410 Cocaine abuse, uncomplicated: Secondary | ICD-10-CM | POA: Diagnosis not present

## 2023-02-19 DIAGNOSIS — F1120 Opioid dependence, uncomplicated: Secondary | ICD-10-CM | POA: Diagnosis not present

## 2023-02-19 DIAGNOSIS — Z1331 Encounter for screening for depression: Secondary | ICD-10-CM | POA: Diagnosis not present

## 2023-02-22 DIAGNOSIS — F1410 Cocaine abuse, uncomplicated: Secondary | ICD-10-CM | POA: Diagnosis not present

## 2023-02-22 DIAGNOSIS — F1120 Opioid dependence, uncomplicated: Secondary | ICD-10-CM | POA: Diagnosis not present

## 2023-02-26 DIAGNOSIS — R52 Pain, unspecified: Secondary | ICD-10-CM | POA: Diagnosis not present

## 2023-02-26 DIAGNOSIS — Z1331 Encounter for screening for depression: Secondary | ICD-10-CM | POA: Diagnosis not present

## 2023-02-26 DIAGNOSIS — F17210 Nicotine dependence, cigarettes, uncomplicated: Secondary | ICD-10-CM | POA: Diagnosis not present

## 2023-02-26 DIAGNOSIS — F1120 Opioid dependence, uncomplicated: Secondary | ICD-10-CM | POA: Diagnosis not present

## 2023-03-05 DIAGNOSIS — F1120 Opioid dependence, uncomplicated: Secondary | ICD-10-CM | POA: Diagnosis not present

## 2023-03-05 DIAGNOSIS — Z1331 Encounter for screening for depression: Secondary | ICD-10-CM | POA: Diagnosis not present

## 2023-03-05 DIAGNOSIS — F17210 Nicotine dependence, cigarettes, uncomplicated: Secondary | ICD-10-CM | POA: Diagnosis not present

## 2023-03-05 DIAGNOSIS — F1410 Cocaine abuse, uncomplicated: Secondary | ICD-10-CM | POA: Diagnosis not present

## 2023-03-15 DIAGNOSIS — F1120 Opioid dependence, uncomplicated: Secondary | ICD-10-CM | POA: Diagnosis not present

## 2023-03-15 DIAGNOSIS — F1410 Cocaine abuse, uncomplicated: Secondary | ICD-10-CM | POA: Diagnosis not present

## 2023-03-22 DIAGNOSIS — F1120 Opioid dependence, uncomplicated: Secondary | ICD-10-CM | POA: Diagnosis not present

## 2023-03-22 DIAGNOSIS — F1410 Cocaine abuse, uncomplicated: Secondary | ICD-10-CM | POA: Diagnosis not present

## 2023-03-29 DIAGNOSIS — F1120 Opioid dependence, uncomplicated: Secondary | ICD-10-CM | POA: Diagnosis not present

## 2023-03-29 DIAGNOSIS — F1410 Cocaine abuse, uncomplicated: Secondary | ICD-10-CM | POA: Diagnosis not present

## 2023-04-05 DIAGNOSIS — F1410 Cocaine abuse, uncomplicated: Secondary | ICD-10-CM | POA: Diagnosis not present

## 2023-04-05 DIAGNOSIS — F1120 Opioid dependence, uncomplicated: Secondary | ICD-10-CM | POA: Diagnosis not present

## 2023-04-12 DIAGNOSIS — F1120 Opioid dependence, uncomplicated: Secondary | ICD-10-CM | POA: Diagnosis not present

## 2023-04-19 DIAGNOSIS — F1410 Cocaine abuse, uncomplicated: Secondary | ICD-10-CM | POA: Diagnosis not present

## 2023-04-19 DIAGNOSIS — F1120 Opioid dependence, uncomplicated: Secondary | ICD-10-CM | POA: Diagnosis not present

## 2023-04-26 DIAGNOSIS — F1410 Cocaine abuse, uncomplicated: Secondary | ICD-10-CM | POA: Diagnosis not present

## 2023-04-26 DIAGNOSIS — F1120 Opioid dependence, uncomplicated: Secondary | ICD-10-CM | POA: Diagnosis not present

## 2023-05-03 DIAGNOSIS — F1120 Opioid dependence, uncomplicated: Secondary | ICD-10-CM | POA: Diagnosis not present

## 2023-05-03 DIAGNOSIS — F1410 Cocaine abuse, uncomplicated: Secondary | ICD-10-CM | POA: Diagnosis not present

## 2023-05-10 DIAGNOSIS — K219 Gastro-esophageal reflux disease without esophagitis: Secondary | ICD-10-CM | POA: Diagnosis not present

## 2023-05-10 DIAGNOSIS — Z716 Tobacco abuse counseling: Secondary | ICD-10-CM | POA: Diagnosis not present

## 2023-05-10 DIAGNOSIS — F17210 Nicotine dependence, cigarettes, uncomplicated: Secondary | ICD-10-CM | POA: Diagnosis not present

## 2023-05-10 DIAGNOSIS — M21962 Unspecified acquired deformity of left lower leg: Secondary | ICD-10-CM | POA: Diagnosis not present

## 2023-05-10 DIAGNOSIS — R600 Localized edema: Secondary | ICD-10-CM | POA: Diagnosis not present

## 2023-05-17 DIAGNOSIS — F1410 Cocaine abuse, uncomplicated: Secondary | ICD-10-CM | POA: Diagnosis not present

## 2023-05-17 DIAGNOSIS — F1120 Opioid dependence, uncomplicated: Secondary | ICD-10-CM | POA: Diagnosis not present

## 2023-05-24 DIAGNOSIS — F1410 Cocaine abuse, uncomplicated: Secondary | ICD-10-CM | POA: Diagnosis not present

## 2023-05-24 DIAGNOSIS — F1120 Opioid dependence, uncomplicated: Secondary | ICD-10-CM | POA: Diagnosis not present

## 2023-05-31 DIAGNOSIS — F1410 Cocaine abuse, uncomplicated: Secondary | ICD-10-CM | POA: Diagnosis not present

## 2023-05-31 DIAGNOSIS — F1120 Opioid dependence, uncomplicated: Secondary | ICD-10-CM | POA: Diagnosis not present

## 2023-06-07 DIAGNOSIS — F1410 Cocaine abuse, uncomplicated: Secondary | ICD-10-CM | POA: Diagnosis not present

## 2023-06-07 DIAGNOSIS — F1120 Opioid dependence, uncomplicated: Secondary | ICD-10-CM | POA: Diagnosis not present

## 2023-06-14 DIAGNOSIS — F1120 Opioid dependence, uncomplicated: Secondary | ICD-10-CM | POA: Diagnosis not present

## 2023-06-14 DIAGNOSIS — F1410 Cocaine abuse, uncomplicated: Secondary | ICD-10-CM | POA: Diagnosis not present

## 2023-06-21 DIAGNOSIS — F1410 Cocaine abuse, uncomplicated: Secondary | ICD-10-CM | POA: Diagnosis not present

## 2023-06-21 DIAGNOSIS — F1120 Opioid dependence, uncomplicated: Secondary | ICD-10-CM | POA: Diagnosis not present

## 2023-06-28 DIAGNOSIS — F1120 Opioid dependence, uncomplicated: Secondary | ICD-10-CM | POA: Diagnosis not present

## 2023-06-28 DIAGNOSIS — F1410 Cocaine abuse, uncomplicated: Secondary | ICD-10-CM | POA: Diagnosis not present

## 2023-07-05 DIAGNOSIS — F1120 Opioid dependence, uncomplicated: Secondary | ICD-10-CM | POA: Diagnosis not present

## 2023-07-05 DIAGNOSIS — F1410 Cocaine abuse, uncomplicated: Secondary | ICD-10-CM | POA: Diagnosis not present

## 2023-07-09 DIAGNOSIS — F1120 Opioid dependence, uncomplicated: Secondary | ICD-10-CM | POA: Diagnosis not present

## 2023-07-12 DIAGNOSIS — F1120 Opioid dependence, uncomplicated: Secondary | ICD-10-CM | POA: Diagnosis not present

## 2023-07-12 DIAGNOSIS — F1410 Cocaine abuse, uncomplicated: Secondary | ICD-10-CM | POA: Diagnosis not present

## 2023-07-16 DIAGNOSIS — F1120 Opioid dependence, uncomplicated: Secondary | ICD-10-CM | POA: Diagnosis not present

## 2023-07-19 DIAGNOSIS — F1410 Cocaine abuse, uncomplicated: Secondary | ICD-10-CM | POA: Diagnosis not present

## 2023-07-19 DIAGNOSIS — F1120 Opioid dependence, uncomplicated: Secondary | ICD-10-CM | POA: Diagnosis not present

## 2023-07-23 DIAGNOSIS — F411 Generalized anxiety disorder: Secondary | ICD-10-CM | POA: Diagnosis not present

## 2023-07-23 DIAGNOSIS — Z716 Tobacco abuse counseling: Secondary | ICD-10-CM | POA: Diagnosis not present

## 2023-07-23 DIAGNOSIS — F17210 Nicotine dependence, cigarettes, uncomplicated: Secondary | ICD-10-CM | POA: Diagnosis not present

## 2023-07-23 DIAGNOSIS — F1410 Cocaine abuse, uncomplicated: Secondary | ICD-10-CM | POA: Diagnosis not present

## 2023-07-23 DIAGNOSIS — F1120 Opioid dependence, uncomplicated: Secondary | ICD-10-CM | POA: Diagnosis not present

## 2023-07-26 DIAGNOSIS — F1120 Opioid dependence, uncomplicated: Secondary | ICD-10-CM | POA: Diagnosis not present

## 2023-07-26 DIAGNOSIS — F1410 Cocaine abuse, uncomplicated: Secondary | ICD-10-CM | POA: Diagnosis not present

## 2023-07-30 DIAGNOSIS — F1910 Other psychoactive substance abuse, uncomplicated: Secondary | ICD-10-CM | POA: Diagnosis not present

## 2023-07-30 DIAGNOSIS — F1120 Opioid dependence, uncomplicated: Secondary | ICD-10-CM | POA: Diagnosis not present

## 2023-07-30 DIAGNOSIS — F411 Generalized anxiety disorder: Secondary | ICD-10-CM | POA: Diagnosis not present

## 2023-07-30 DIAGNOSIS — F17210 Nicotine dependence, cigarettes, uncomplicated: Secondary | ICD-10-CM | POA: Diagnosis not present

## 2023-08-06 DIAGNOSIS — F17210 Nicotine dependence, cigarettes, uncomplicated: Secondary | ICD-10-CM | POA: Diagnosis not present

## 2023-08-06 DIAGNOSIS — F1120 Opioid dependence, uncomplicated: Secondary | ICD-10-CM | POA: Diagnosis not present

## 2023-08-06 DIAGNOSIS — Z1331 Encounter for screening for depression: Secondary | ICD-10-CM | POA: Diagnosis not present

## 2023-08-06 DIAGNOSIS — F411 Generalized anxiety disorder: Secondary | ICD-10-CM | POA: Diagnosis not present

## 2023-08-06 DIAGNOSIS — F1410 Cocaine abuse, uncomplicated: Secondary | ICD-10-CM | POA: Diagnosis not present

## 2023-08-20 DIAGNOSIS — F1120 Opioid dependence, uncomplicated: Secondary | ICD-10-CM | POA: Diagnosis not present

## 2023-08-27 DIAGNOSIS — F1120 Opioid dependence, uncomplicated: Secondary | ICD-10-CM | POA: Diagnosis not present

## 2023-08-27 DIAGNOSIS — F17210 Nicotine dependence, cigarettes, uncomplicated: Secondary | ICD-10-CM | POA: Diagnosis not present

## 2023-08-27 DIAGNOSIS — F1410 Cocaine abuse, uncomplicated: Secondary | ICD-10-CM | POA: Diagnosis not present

## 2023-08-27 DIAGNOSIS — Z716 Tobacco abuse counseling: Secondary | ICD-10-CM | POA: Diagnosis not present

## 2023-08-27 DIAGNOSIS — F411 Generalized anxiety disorder: Secondary | ICD-10-CM | POA: Diagnosis not present

## 2023-09-03 DIAGNOSIS — F1410 Cocaine abuse, uncomplicated: Secondary | ICD-10-CM | POA: Diagnosis not present

## 2023-09-03 DIAGNOSIS — F1120 Opioid dependence, uncomplicated: Secondary | ICD-10-CM | POA: Diagnosis not present

## 2023-09-03 DIAGNOSIS — F411 Generalized anxiety disorder: Secondary | ICD-10-CM | POA: Diagnosis not present

## 2023-09-03 DIAGNOSIS — F17210 Nicotine dependence, cigarettes, uncomplicated: Secondary | ICD-10-CM | POA: Diagnosis not present

## 2023-09-10 DIAGNOSIS — F1120 Opioid dependence, uncomplicated: Secondary | ICD-10-CM | POA: Diagnosis not present

## 2023-09-17 DIAGNOSIS — F1120 Opioid dependence, uncomplicated: Secondary | ICD-10-CM | POA: Diagnosis not present

## 2023-09-17 DIAGNOSIS — Z716 Tobacco abuse counseling: Secondary | ICD-10-CM | POA: Diagnosis not present

## 2023-09-17 DIAGNOSIS — F411 Generalized anxiety disorder: Secondary | ICD-10-CM | POA: Diagnosis not present

## 2023-09-17 DIAGNOSIS — F17210 Nicotine dependence, cigarettes, uncomplicated: Secondary | ICD-10-CM | POA: Diagnosis not present

## 2023-09-17 DIAGNOSIS — F1410 Cocaine abuse, uncomplicated: Secondary | ICD-10-CM | POA: Diagnosis not present

## 2023-10-01 DIAGNOSIS — F411 Generalized anxiety disorder: Secondary | ICD-10-CM | POA: Diagnosis not present

## 2023-10-01 DIAGNOSIS — F17210 Nicotine dependence, cigarettes, uncomplicated: Secondary | ICD-10-CM | POA: Diagnosis not present

## 2023-10-01 DIAGNOSIS — F1410 Cocaine abuse, uncomplicated: Secondary | ICD-10-CM | POA: Diagnosis not present

## 2023-10-01 DIAGNOSIS — F1120 Opioid dependence, uncomplicated: Secondary | ICD-10-CM | POA: Diagnosis not present

## 2023-10-08 DIAGNOSIS — F1120 Opioid dependence, uncomplicated: Secondary | ICD-10-CM | POA: Diagnosis not present

## 2023-10-15 DIAGNOSIS — F411 Generalized anxiety disorder: Secondary | ICD-10-CM | POA: Diagnosis not present

## 2023-10-15 DIAGNOSIS — F17210 Nicotine dependence, cigarettes, uncomplicated: Secondary | ICD-10-CM | POA: Diagnosis not present

## 2023-10-15 DIAGNOSIS — F1120 Opioid dependence, uncomplicated: Secondary | ICD-10-CM | POA: Diagnosis not present

## 2023-10-22 DIAGNOSIS — F1410 Cocaine abuse, uncomplicated: Secondary | ICD-10-CM | POA: Diagnosis not present

## 2023-10-22 DIAGNOSIS — Z716 Tobacco abuse counseling: Secondary | ICD-10-CM | POA: Diagnosis not present

## 2023-10-22 DIAGNOSIS — F17210 Nicotine dependence, cigarettes, uncomplicated: Secondary | ICD-10-CM | POA: Diagnosis not present

## 2023-10-22 DIAGNOSIS — F411 Generalized anxiety disorder: Secondary | ICD-10-CM | POA: Diagnosis not present

## 2023-10-22 DIAGNOSIS — F1120 Opioid dependence, uncomplicated: Secondary | ICD-10-CM | POA: Diagnosis not present

## 2023-10-29 DIAGNOSIS — F17210 Nicotine dependence, cigarettes, uncomplicated: Secondary | ICD-10-CM | POA: Diagnosis not present

## 2023-10-29 DIAGNOSIS — F1120 Opioid dependence, uncomplicated: Secondary | ICD-10-CM | POA: Diagnosis not present

## 2023-10-29 DIAGNOSIS — F411 Generalized anxiety disorder: Secondary | ICD-10-CM | POA: Diagnosis not present

## 2023-10-29 DIAGNOSIS — F1410 Cocaine abuse, uncomplicated: Secondary | ICD-10-CM | POA: Diagnosis not present

## 2023-11-05 DIAGNOSIS — F17210 Nicotine dependence, cigarettes, uncomplicated: Secondary | ICD-10-CM | POA: Diagnosis not present

## 2023-11-05 DIAGNOSIS — F1120 Opioid dependence, uncomplicated: Secondary | ICD-10-CM | POA: Diagnosis not present

## 2023-11-05 DIAGNOSIS — F411 Generalized anxiety disorder: Secondary | ICD-10-CM | POA: Diagnosis not present

## 2023-11-12 DIAGNOSIS — F17210 Nicotine dependence, cigarettes, uncomplicated: Secondary | ICD-10-CM | POA: Diagnosis not present

## 2023-11-12 DIAGNOSIS — F1410 Cocaine abuse, uncomplicated: Secondary | ICD-10-CM | POA: Diagnosis not present

## 2023-11-12 DIAGNOSIS — F1120 Opioid dependence, uncomplicated: Secondary | ICD-10-CM | POA: Diagnosis not present

## 2023-11-12 DIAGNOSIS — F411 Generalized anxiety disorder: Secondary | ICD-10-CM | POA: Diagnosis not present

## 2023-11-16 DIAGNOSIS — F1120 Opioid dependence, uncomplicated: Secondary | ICD-10-CM | POA: Diagnosis not present

## 2023-11-16 DIAGNOSIS — F17210 Nicotine dependence, cigarettes, uncomplicated: Secondary | ICD-10-CM | POA: Diagnosis not present

## 2023-11-16 DIAGNOSIS — Z716 Tobacco abuse counseling: Secondary | ICD-10-CM | POA: Diagnosis not present

## 2023-11-16 DIAGNOSIS — F1410 Cocaine abuse, uncomplicated: Secondary | ICD-10-CM | POA: Diagnosis not present

## 2023-11-19 DIAGNOSIS — F17210 Nicotine dependence, cigarettes, uncomplicated: Secondary | ICD-10-CM | POA: Diagnosis not present

## 2023-11-19 DIAGNOSIS — F411 Generalized anxiety disorder: Secondary | ICD-10-CM | POA: Diagnosis not present

## 2023-11-19 DIAGNOSIS — F1120 Opioid dependence, uncomplicated: Secondary | ICD-10-CM | POA: Diagnosis not present

## 2023-11-26 DIAGNOSIS — F1120 Opioid dependence, uncomplicated: Secondary | ICD-10-CM | POA: Diagnosis not present

## 2023-11-26 DIAGNOSIS — F17210 Nicotine dependence, cigarettes, uncomplicated: Secondary | ICD-10-CM | POA: Diagnosis not present

## 2023-11-26 DIAGNOSIS — F411 Generalized anxiety disorder: Secondary | ICD-10-CM | POA: Diagnosis not present

## 2023-12-03 DIAGNOSIS — F17210 Nicotine dependence, cigarettes, uncomplicated: Secondary | ICD-10-CM | POA: Diagnosis not present

## 2023-12-03 DIAGNOSIS — Z79899 Other long term (current) drug therapy: Secondary | ICD-10-CM | POA: Diagnosis not present

## 2023-12-03 DIAGNOSIS — F411 Generalized anxiety disorder: Secondary | ICD-10-CM | POA: Diagnosis not present

## 2023-12-03 DIAGNOSIS — F1120 Opioid dependence, uncomplicated: Secondary | ICD-10-CM | POA: Diagnosis not present

## 2023-12-10 DIAGNOSIS — F411 Generalized anxiety disorder: Secondary | ICD-10-CM | POA: Diagnosis not present

## 2023-12-10 DIAGNOSIS — F17210 Nicotine dependence, cigarettes, uncomplicated: Secondary | ICD-10-CM | POA: Diagnosis not present

## 2023-12-10 DIAGNOSIS — F1120 Opioid dependence, uncomplicated: Secondary | ICD-10-CM | POA: Diagnosis not present

## 2023-12-24 DIAGNOSIS — F1120 Opioid dependence, uncomplicated: Secondary | ICD-10-CM | POA: Diagnosis not present

## 2023-12-24 DIAGNOSIS — F411 Generalized anxiety disorder: Secondary | ICD-10-CM | POA: Diagnosis not present

## 2023-12-24 DIAGNOSIS — Z716 Tobacco abuse counseling: Secondary | ICD-10-CM | POA: Diagnosis not present

## 2023-12-24 DIAGNOSIS — F17210 Nicotine dependence, cigarettes, uncomplicated: Secondary | ICD-10-CM | POA: Diagnosis not present

## 2024-02-25 DIAGNOSIS — F1120 Opioid dependence, uncomplicated: Secondary | ICD-10-CM | POA: Diagnosis not present

## 2024-02-25 DIAGNOSIS — Z79899 Other long term (current) drug therapy: Secondary | ICD-10-CM | POA: Diagnosis not present

## 2024-03-03 DIAGNOSIS — F1120 Opioid dependence, uncomplicated: Secondary | ICD-10-CM | POA: Diagnosis not present

## 2024-03-24 DIAGNOSIS — F1120 Opioid dependence, uncomplicated: Secondary | ICD-10-CM | POA: Diagnosis not present

## 2024-04-17 ENCOUNTER — Emergency Department
Admission: EM | Admit: 2024-04-17 | Discharge: 2024-04-18 | Disposition: A | Discharge status: Home / Self Care | Attending: Emergency Medicine | Admitting: Emergency Medicine

## 2024-04-17 ENCOUNTER — Other Ambulatory Visit: Payer: Self-pay

## 2024-04-17 ENCOUNTER — Encounter: Payer: Self-pay | Admitting: Emergency Medicine

## 2024-04-17 DIAGNOSIS — F1193 Opioid use, unspecified with withdrawal: Secondary | ICD-10-CM

## 2024-04-17 DIAGNOSIS — E119 Type 2 diabetes mellitus without complications: Secondary | ICD-10-CM | POA: Insufficient documentation

## 2024-04-17 DIAGNOSIS — F1123 Opioid dependence with withdrawal: Secondary | ICD-10-CM | POA: Diagnosis not present

## 2024-04-17 DIAGNOSIS — R000 Tachycardia, unspecified: Secondary | ICD-10-CM | POA: Diagnosis not present

## 2024-04-17 LAB — CBC WITH DIFFERENTIAL/PLATELET
Abs Immature Granulocytes: 0.02 K/uL (ref 0.00–0.07)
Basophils Absolute: 0 K/uL (ref 0.0–0.1)
Basophils Relative: 0 %
Eosinophils Absolute: 0.1 K/uL (ref 0.0–0.5)
Eosinophils Relative: 1 %
HCT: 42.4 % (ref 39.0–52.0)
Hemoglobin: 14.1 g/dL (ref 13.0–17.0)
Immature Granulocytes: 0 %
Lymphocytes Relative: 18 %
Lymphs Abs: 1.4 K/uL (ref 0.7–4.0)
MCH: 25.7 pg — ABNORMAL LOW (ref 26.0–34.0)
MCHC: 33.3 g/dL (ref 30.0–36.0)
MCV: 77.2 fL — ABNORMAL LOW (ref 80.0–100.0)
Monocytes Absolute: 0.6 K/uL (ref 0.1–1.0)
Monocytes Relative: 7 %
Neutro Abs: 5.5 K/uL (ref 1.7–7.7)
Neutrophils Relative %: 74 %
Platelets: 156 K/uL (ref 150–400)
RBC: 5.49 MIL/uL (ref 4.22–5.81)
RDW: 13.4 % (ref 11.5–15.5)
WBC: 7.5 K/uL (ref 4.0–10.5)
nRBC: 0 % (ref 0.0–0.2)

## 2024-04-17 LAB — ETHANOL: Alcohol, Ethyl (B): <15 mg/dL (ref <15)

## 2024-04-17 MED ORDER — CLONIDINE HCL 0.1 MG PO TABS: 0.1000 mg | ORAL_TABLET | ORAL | Status: DC | PRN

## 2024-04-17 MED ORDER — ONDANSETRON HCL 4 MG/2ML IJ SOLN
4.0000 mg | Freq: Once | INTRAMUSCULAR | Status: AC
  Administered 2024-04-17: 4 mg via INTRAVENOUS
  Filled 2024-04-17: qty 2

## 2024-04-17 MED ORDER — DIAZEPAM 5 MG/ML IJ SOLN
5.0000 mg | Freq: Once | INTRAMUSCULAR | Status: AC
  Administered 2024-04-17: 5 mg via INTRAVENOUS
  Filled 2024-04-17: qty 2

## 2024-04-17 MED ORDER — CLONIDINE HCL 0.1 MG PO TABS: 0.2000 mg | ORAL_TABLET | Freq: Once | ORAL | Status: DC

## 2024-04-17 MED ORDER — CLONIDINE HCL 0.1 MG PO TABS
0.1000 mg | ORAL_TABLET | ORAL | Status: DC | PRN
  Administered 2024-04-18: 0.1 mg via ORAL
  Filled 2024-04-17: qty 1

## 2024-04-17 MED ORDER — SODIUM CHLORIDE 0.9 % IV BOLUS
500.0000 mL | Freq: Once | INTRAVENOUS | Status: AC
  Administered 2024-04-17: 500 mL via INTRAVENOUS

## 2024-04-17 NOTE — ED Provider Notes (Incomplete)
 [facility] Provider Note    Event Date/Time   First MD Initiated Contact with Patient 04/17/24 2307     (approximate)   History   Withdrawal   HPI  Joshua Gordon is a 68 y.o. male   Past medical history of ***    Independent Historian contributed to assessment above: ***  External Medical Documents Reviewed: ***      Physical Exam   Triage Vital Signs: ED Triage Vitals  Encounter Vitals Group     BP 04/17/24 2244 (!) 161/122     Girls Systolic BP Percentile --      Girls Diastolic BP Percentile --      Boys Systolic BP Percentile --      Boys Diastolic BP Percentile --      Pulse Rate 04/17/24 2244 (!) 103     Resp 04/17/24 2244 20     Temp 04/17/24 2244 98.4 F (36.9 C)     Temp Source 04/17/24 2244 Oral     SpO2 04/17/24 2244 93 %     Weight 04/17/24 2307 225 lb (102.1 kg)     Height 04/17/24 2307 6' 1 (1.854 m)     Head Circumference --      Peak Flow --      Pain Score 04/17/24 2255 10     Pain Loc --      Pain Education --      Exclude from Growth Chart --     Most recent vital signs: Vitals:   04/17/24 2244  BP: (!) 161/122  Pulse: (!) 103  Resp: 20  Temp: 98.4 F (36.9 C)  SpO2: 93%    General: Awake, no distress. *** CV:  Good peripheral perfusion. *** Resp:  Normal effort. *** Abd:  No distention. *** Other:  ***   ED Results / Procedures / Treatments   Labs (all labs ordered are listed, but only abnormal results are displayed) Labs Reviewed - No data to display   I ordered and reviewed the above labs they are notable for ***  EKG  ED ECG REPORT I, Ginnie Shams, the attending physician, personally viewed and interpreted this ECG.   Date: 04/17/2024  EKG Time: ***  Rate: ***  Rhythm: {ekg findings:315101}  Axis: ***  Intervals:{conduction defects:17367}  ST&T Change: ***    RADIOLOGY I independently reviewed and interpreted *** I also reviewed radiologist's formal read.    PROCEDURES:  Critical Care performed: {CriticalCareYesNo:19197::Yes, see critical care procedure note(s),No}  Procedures   MEDICATIONS ORDERED IN ED: Medications - No data to display  External physician / consultants:  I spoke with *** regarding care plan for this patient.   IMPRESSION / MDM / ASSESSMENT AND PLAN / ED COURSE  I reviewed the triage vital signs and the nursing notes.                                Patient's presentation is most consistent with {EM COPA:27473}  Differential diagnosis includes, but is not limited to, ***   ***The patient is on the cardiac monitor to evaluate for evidence of arrhythmia and/or significant heart rate changes.  MDM:  ***  I considered hospitalization for admission or observation ***        FINAL CLINICAL IMPRESSION(S) / ED DIAGNOSES   Final diagnoses:  None     Rx / DC Orders   ED Discharge Orders  None        Note:  This document was prepared using Dragon voice recognition software and may include unintentional dictation errors.

## 2024-04-17 NOTE — ED Provider Notes (Signed)
 [facility] Provider Note    Event Date/Time   First MD Initiated Contact with Patient 04/17/24 2307     (approximate)   History   Withdrawal   HPI  Joshua Gordon is a 68 y.o. male   Past medical history of chronic pain, diabetes, substance use, here with complaints of narcotic withdrawal.  Has been using fentanyl  as well as his Suboxone  for the last 3 weeks.  Stopped using yesterday feels withdrawals, including cramping pains all over his body, nausea, diarrhea.  No other drug use.  No alcohol use.  Requesting medications to help relieve his symptoms.  Denies respiratory infectious symptoms.  Denies trauma   External Medical Documents Reviewed: Previous hospital notes      Physical Exam   Triage Vital Signs: ED Triage Vitals  Encounter Vitals Group     BP 04/17/24 2244 (!) 161/122     Girls Systolic BP Percentile --      Girls Diastolic BP Percentile --      Boys Systolic BP Percentile --      Boys Diastolic BP Percentile --      Pulse Rate 04/17/24 2244 (!) 103     Resp 04/17/24 2244 20     Temp 04/17/24 2244 98.4 F (36.9 C)     Temp Source 04/17/24 2244 Oral     SpO2 04/17/24 2244 93 %     Weight 04/17/24 2307 225 lb (102.1 kg)     Height 04/17/24 2307 6' 1 (1.854 m)     Head Circumference --      Peak Flow --      Pain Score 04/17/24 2255 10     Pain Loc --      Pain Education --      Exclude from Growth Chart --     Most recent vital signs: Vitals:   04/17/24 2244 04/18/24 0013  BP: (!) 161/122 (!) 171/104  Pulse: (!) 103   Resp: 20   Temp: 98.4 F (36.9 C)   SpO2: 93%     General: Awake, no distress.  CV:  Good peripheral perfusion. Resp:  Normal effort.  Abd:  No distention.  Other:  Deformity to the left leg which patient states is chronic from an old injury years ago.  Hypertensive and tachycardic.  Soft benign abdominal exam clear lungs no heart murmur.  Answering questions appropriately.  Neck supple  full range of motion no obvious signs of trauma to head.   ED Results / Procedures / Treatments   Labs (all labs ordered are listed, but only abnormal results are displayed) Labs Reviewed  COMPREHENSIVE METABOLIC PANEL WITH GFR - Abnormal; Notable for the following components:      Result Value   Sodium 131 (*)    Chloride 91 (*)    Glucose, Bld 318 (*)    Total Protein 8.9 (*)    Total Bilirubin 1.5 (*)    All other components within normal limits  CBC WITH DIFFERENTIAL/PLATELET - Abnormal; Notable for the following components:   MCV 77.2 (*)    MCH 25.7 (*)    All other components within normal limits  CK - Abnormal; Notable for the following components:   Total CK 456 (*)    All other components within normal limits  RESP PANEL BY RT-PCR (RSV, FLU A&B, COVID)  [id number]  ETHANOL  LIPASE, BLOOD  TROPONIN I (HIGH SENSITIVITY)  TROPONIN I (HIGH SENSITIVITY)     I ordered  and reviewed the above labs they are notable for hyperglycemia no evidence of DKA, total CK in the 400s.  EKG  ED ECG REPORT I, Ginnie Shams, the attending physician, personally viewed and interpreted this ECG.   Date: 04/17/2024  EKG Time: 2250  Rate: 102  Rhythm: sinus tachycardia  Axis: nl  Intervals:  ST&T Change: no stemi   PROCEDURES:  Critical Care performed: No  Procedures   MEDICATIONS ORDERED IN ED: Medications  cloNIDine  (CATAPRES ) tablet 0.1 mg (0.1 mg Oral Given 04/18/24 0013)  ondansetron  (ZOFRAN ) injection 4 mg (4 mg Intravenous Given 04/17/24 2331)  diazepam (VALIUM) injection 5 mg (5 mg Intravenous Given 04/17/24 2331)  sodium chloride  0.9 % bolus 500 mL (0 mLs Intravenous Stopped 04/18/24 0003)  diazepam (VALIUM) tablet 5 mg (5 mg Oral Given 04/18/24 0046)     IMPRESSION / MDM / ASSESSMENT AND PLAN / ED COURSE  I reviewed the triage vital signs and the nursing notes.                                Patient's presentation is most consistent with acute presentation with  potential threat to life or bodily function.  Differential diagnosis includes, but is not limited to, opioid withdrawal, rhabdomyolysis, viral illness like the flu, ACS, infection   The patient is on the cardiac monitor to evaluate for evidence of arrhythmia and/or significant heart rate changes.  MDM:    Patient with opioid withdrawal symptoms most likely due to opioid withdrawal.  Given his age and comorbidities check for other conditions like viral illness, ACS, rhabdomyolysis.  Fortunately his evaluation is largely unremarkable for alternative diagnoses, and he has somewhat relieved symptoms with Valium and clonidine .  He looks much more comfortable but is asking for more medications.  He states that he is still on Suboxone  and has follow-up with his provider.  I considered hospitalization for admission or observation however given unremarkable workup as above I doubt life-threatening condition at this time will be discharged with close follow-up with his PMD and ongoing Suboxone .        FINAL CLINICAL IMPRESSION(S) / ED DIAGNOSES   Final diagnoses:  Opioid withdrawal (HCC)     Rx / DC Orders   ED Discharge Orders     None        Note:  This document was prepared using Dragon voice recognition software and may include unintentional dictation errors.    Shams Ginnie, MD 04/18/24 [phone]

## 2024-04-17 NOTE — ED Triage Notes (Signed)
 Pt BIB [facility] from home for fentanyl  withdrawal after 3 weeks use, last dose yesterday morning with complaints of pain all over. Pt denies nausea. Pt alert and oriented. Wheelchair bound at baseline per EMS. Pt with some tremors and appears unwell.   HR 104 180/110 with HTN hx per EMS

## 2024-04-18 DIAGNOSIS — F1123 Opioid dependence with withdrawal: Secondary | ICD-10-CM | POA: Diagnosis not present

## 2024-04-18 LAB — COMPREHENSIVE METABOLIC PANEL WITH GFR
ALT: 17 U/L (ref 0–44)
AST: 26 U/L (ref 15–41)
Albumin: 3.8 g/dL (ref 3.5–5.0)
Alkaline Phosphatase: 81 U/L (ref 38–126)
Anion gap: 14 (ref 5–15)
BUN: 9 mg/dL (ref 8–23)
CO2: 26 mmol/L (ref 22–32)
Calcium: 9.4 mg/dL (ref 8.9–10.3)
Chloride: 91 mmol/L — ABNORMAL LOW (ref 98–111)
Creatinine, Ser: 0.68 mg/dL (ref 0.61–1.24)
GFR, Estimated: >60 mL/min (ref >60)
Glucose, Bld: 318 mg/dL — ABNORMAL HIGH (ref 70–99)
Potassium: 3.6 mmol/L (ref 3.5–5.1)
Sodium: 131 mmol/L — ABNORMAL LOW (ref 135–145)
Total Bilirubin: 1.5 mg/dL — ABNORMAL HIGH (ref 0.0–1.2)
Total Protein: 8.9 g/dL — ABNORMAL HIGH (ref 6.5–8.1)

## 2024-04-18 LAB — TROPONIN I (HIGH SENSITIVITY): Troponin I (High Sensitivity): 15 ng/L (ref <18)

## 2024-04-18 LAB — RESP PANEL BY RT-PCR (RSV, FLU A&B, COVID)  RVPGX2
Influenza A by PCR: NEGATIVE
Influenza B by PCR: NEGATIVE
Resp Syncytial Virus by PCR: NEGATIVE
SARS Coronavirus 2 by RT PCR: NEGATIVE

## 2024-04-18 LAB — LIPASE, BLOOD: Lipase: 23 U/L (ref 11–51)

## 2024-04-18 LAB — CK: Total CK: 456 U/L — ABNORMAL HIGH (ref 49–397)

## 2024-04-18 MED ORDER — DIAZEPAM 5 MG PO TABS
5.0000 mg | ORAL_TABLET | Freq: Once | ORAL | Status: AC
  Administered 2024-04-18: 5 mg via ORAL
  Filled 2024-04-18: qty 1

## 2024-04-18 NOTE — ED Notes (Signed)
 Pt finally able to confirm ride home via friend who confirmed that he is on the way.

## 2024-04-18 NOTE — ED Notes (Signed)
 Pt discharged to ED lobby at this time and left with all belongings. Pt ABCs intact. RR even and unlabored. Pt in NAD. Pt denies further needs from this RN. Awaiting transportation home from friend.

## 2024-04-18 NOTE — ED Notes (Signed)
 Pt assisted to restroom at this time via wheelchair and help from this RN.

## 2024-04-18 NOTE — ED Notes (Signed)
 Pt awaiting transportation home, unable to find family member with transportation to pick him up at this time.

## 2024-04-18 NOTE — ED Notes (Signed)
 RN spoke with Naomie, pts niece who called for update on pt. She was informed that he is okay, but no further information can be given without pts consent. Naomie was informed that she is able to call him or vice versa for more details.

## 2024-04-18 NOTE — ED Notes (Signed)
 Pt informed MD that he wants to go home.

## 2024-04-18 NOTE — ED Notes (Signed)
 Patient niece Naomie called again and RN arranged to speak with pt over hospital phone.

## 2024-04-18 NOTE — Discharge Instructions (Signed)
 Continue taking your Suboxone  as prescribed and follow-up with your doctor.  Thank you for choosing us  for your health care today!  Please see your primary doctor this week for a follow up appointment.   If you have any new, worsening, or unexpected symptoms call your doctor right away or come back to the emergency department for reevaluation.  It was my pleasure to care for you today.   Ginnie EDISON Cyrena, MD

## 2024-04-18 NOTE — ED Notes (Signed)
 After several attempts, pt unable to find someone to either pick him up or be at his home upon transportation request.

## 2024-04-18 NOTE — ED Notes (Signed)
 Pt still rocking back and forth, appearing be be in discomfort. Pt is requesting additional medication for symptoms, although pt has been informed that he has to wait for next dose of clonidine . Pt stated to RN, I might as well go home if this is all imma get.

## 2024-04-18 NOTE — ED Notes (Signed)
 Pt provided with sandwich tray and beverage. Pt able to eat well.
